# Patient Record
Sex: Female | Born: 1965
Health system: Southern US, Community
[De-identification: ages and names within clinical notes are randomized; demographics above are authoritative.]

## PROBLEM LIST (undated history)

## (undated) DIAGNOSIS — K219 Gastro-esophageal reflux disease without esophagitis: Secondary | ICD-10-CM

## (undated) DIAGNOSIS — I10 Essential (primary) hypertension: Secondary | ICD-10-CM

## (undated) DIAGNOSIS — E669 Obesity, unspecified: Secondary | ICD-10-CM

## (undated) DIAGNOSIS — F329 Major depressive disorder, single episode, unspecified: Secondary | ICD-10-CM

## (undated) DIAGNOSIS — D219 Benign neoplasm of connective and other soft tissue, unspecified: Secondary | ICD-10-CM

## (undated) DIAGNOSIS — F32A Depression, unspecified: Secondary | ICD-10-CM

## (undated) HISTORY — DX: Benign neoplasm of connective and other soft tissue, unspecified: D21.9

## (undated) HISTORY — PX: COLON SURGERY: SHX602

## (undated) HISTORY — PX: TUBAL LIGATION: SHX77

---

## 2006-09-10 HISTORY — PX: DILATION AND CURETTAGE OF UTERUS: SHX78

## 2008-11-22 ENCOUNTER — Emergency Department (HOSPITAL_COMMUNITY): Admission: EM | Admit: 2008-11-22 | Discharge: 2008-11-22 | Payer: Self-pay | Admitting: Emergency Medicine

## 2009-08-29 ENCOUNTER — Encounter: Admission: RE | Admit: 2009-08-29 | Discharge: 2009-11-01 | Payer: Self-pay | Admitting: Sports Medicine

## 2009-09-30 ENCOUNTER — Encounter: Admission: RE | Admit: 2009-09-30 | Discharge: 2009-09-30 | Payer: Self-pay | Admitting: Otolaryngology

## 2010-03-02 ENCOUNTER — Encounter: Payer: Self-pay | Admitting: Otolaryngology

## 2010-03-02 ENCOUNTER — Encounter: Payer: Self-pay | Admitting: Sports Medicine

## 2010-06-17 ENCOUNTER — Inpatient Hospital Stay (INDEPENDENT_AMBULATORY_CARE_PROVIDER_SITE_OTHER)
Admission: RE | Admit: 2010-06-17 | Discharge: 2010-06-17 | Disposition: A | Discharge status: Home / Self Care | Payer: Medicaid Other | Source: Ambulatory Visit | Attending: Emergency Medicine | Admitting: Emergency Medicine

## 2010-06-17 DIAGNOSIS — R51 Headache: Secondary | ICD-10-CM

## 2010-06-17 DIAGNOSIS — M549 Dorsalgia, unspecified: Secondary | ICD-10-CM

## 2010-06-17 DIAGNOSIS — M79609 Pain in unspecified limb: Secondary | ICD-10-CM

## 2010-09-11 ENCOUNTER — Ambulatory Visit: Payer: Medicaid Other | Attending: Podiatry | Admitting: Physical Therapy

## 2010-09-11 DIAGNOSIS — M25579 Pain in unspecified ankle and joints of unspecified foot: Secondary | ICD-10-CM | POA: Insufficient documentation

## 2010-09-11 DIAGNOSIS — IMO0001 Reserved for inherently not codable concepts without codable children: Secondary | ICD-10-CM | POA: Insufficient documentation

## 2010-09-11 DIAGNOSIS — M25673 Stiffness of unspecified ankle, not elsewhere classified: Secondary | ICD-10-CM | POA: Insufficient documentation

## 2010-09-11 DIAGNOSIS — M25676 Stiffness of unspecified foot, not elsewhere classified: Secondary | ICD-10-CM | POA: Insufficient documentation

## 2010-09-11 DIAGNOSIS — R262 Difficulty in walking, not elsewhere classified: Secondary | ICD-10-CM | POA: Insufficient documentation

## 2010-09-22 ENCOUNTER — Inpatient Hospital Stay (INDEPENDENT_AMBULATORY_CARE_PROVIDER_SITE_OTHER)
Admission: RE | Admit: 2010-09-22 | Discharge: 2010-09-22 | Disposition: A | Discharge status: Home / Self Care | Payer: Medicaid Other | Source: Ambulatory Visit | Attending: Family Medicine | Admitting: Family Medicine

## 2010-09-22 ENCOUNTER — Encounter: Payer: Medicaid Other | Admitting: Physical Therapy

## 2010-09-22 DIAGNOSIS — K5289 Other specified noninfective gastroenteritis and colitis: Secondary | ICD-10-CM

## 2010-09-22 DIAGNOSIS — B9789 Other viral agents as the cause of diseases classified elsewhere: Secondary | ICD-10-CM

## 2011-10-06 ENCOUNTER — Emergency Department (INDEPENDENT_AMBULATORY_CARE_PROVIDER_SITE_OTHER): Payer: Medicaid Other

## 2011-10-06 ENCOUNTER — Encounter (HOSPITAL_COMMUNITY): Payer: Self-pay | Admitting: Emergency Medicine

## 2011-10-06 ENCOUNTER — Emergency Department (INDEPENDENT_AMBULATORY_CARE_PROVIDER_SITE_OTHER)
Admission: EM | Admit: 2011-10-06 | Discharge: 2011-10-06 | Disposition: A | Discharge status: Home / Self Care | Payer: Medicaid Other | Source: Home / Self Care | Attending: Emergency Medicine | Admitting: Emergency Medicine

## 2011-10-06 DIAGNOSIS — J45909 Unspecified asthma, uncomplicated: Secondary | ICD-10-CM

## 2011-10-06 HISTORY — DX: Depression, unspecified: F32.A

## 2011-10-06 HISTORY — DX: Essential (primary) hypertension: I10

## 2011-10-06 HISTORY — DX: Major depressive disorder, single episode, unspecified: F32.9

## 2011-10-06 MED ORDER — ALBUTEROL SULFATE HFA 108 (90 BASE) MCG/ACT IN AERS
2.0000 | INHALATION_SPRAY | RESPIRATORY_TRACT | Status: DC
  Administered 2011-10-06: 2 via RESPIRATORY_TRACT

## 2011-10-06 MED ORDER — ALBUTEROL SULFATE HFA 108 (90 BASE) MCG/ACT IN AERS
INHALATION_SPRAY | RESPIRATORY_TRACT | Status: AC
  Filled 2011-10-06: qty 6.7

## 2011-10-06 MED ORDER — HYDROCODONE-ACETAMINOPHEN 5-325 MG PO TABS: ORAL_TABLET | ORAL | Status: AC

## 2011-10-06 MED ORDER — DOXYCYCLINE HYCLATE 100 MG PO TABS: 100.0000 mg | ORAL_TABLET | Freq: Two times a day (BID) | ORAL | Status: AC

## 2011-10-06 MED ORDER — ALBUTEROL SULFATE HFA 108 (90 BASE) MCG/ACT IN AERS: 1.0000 | INHALATION_SPRAY | Freq: Four times a day (QID) | RESPIRATORY_TRACT | Status: DC | PRN

## 2011-10-06 MED ORDER — PREDNISONE 10 MG PO TABS: ORAL_TABLET | ORAL | Status: DC

## 2011-10-06 NOTE — ED Notes (Signed)
Here for evaluation of SOB, hurts to breathe; NAD at present

## 2011-10-06 NOTE — ED Notes (Signed)
Was asked to evaluate this patient on arrival to [facility]. Patient states she was seen and released by another facility earlier today, and prescribed medication for her respiratory issues. She feels as if she is no better, even though she has started her new rx earlier today. Chest clear to ascultation, speaking in long , complex sentences w/o observable diffficulty. Skin w/d/color good; ?anxious.

## 2011-10-06 NOTE — ED Provider Notes (Signed)
Chief Complaint  Patient presents with  . Shortness of Breath    History of Present Illness:   Sarah Mathews is a 46 year old female who's had a ten-day history of cough productive yellow-green sputum, wheezing, and chest tightness. She also complains of shortness of breath and dyspnea on exertion. She feels tired and doesn't have much energy. She's had a headache and nasal congestion with yellowish to clear drainage. She denies any sore throat. She denies any anterior chest pain. She has some right posterior chest pain today which is now gone away. She saw her primary care physician, Dr. Mayford Knife, on August 22 and he diagnosed bronchitis and started her on Augmentin. This is caused nausea. He also thought her blood pressure to be elevated and started her on hydrochlorothiazide. She denies any PND, orthopnea, hemoptysis, fever, chills, sweats, ankle edema, leg pain, or calf swelling.  Review of Systems:  Other than noted above, the patient denies any of the following symptoms: Systemic:  No fevers, chills, sweats, weight loss or gain, fatigue, or tiredness. ENT:  No nasal congestion, sneezing, itching, postnasal drip, sinus pressure, headache, sore throat, or hoarseness. Lungs:  No wheezing, shortness of breath, chest tightness or congestion. Heart:  No chest pain, tightness, pressure, PND, orthopnea, or ankle edema. GI:  No indigestion, heartburn, waterbrash, burping, abdominal pain, nausea, or vomiting.  PMFSH:  Past medical history, family history, social history, meds, and allergies were reviewed.  Physical Exam:   Vital signs:  BP 138/78  Pulse 76  Temp 98.1 F (36.7 C) (Oral)  Resp 18  SpO2 100%  LMP 09/18/2011 General:  Alert and oriented.  In no distress.  Skin warm and dry. ENT: TMs and ear canals normal.  Nasal mucosa normal, without drainage.  Pharynx clear without exudate or drainage.  No intraoral lesions. Neck:  No adenopathy, tenderness or mass.  No JVD. Lungs:  No respiratory  distress.  Breath sounds clear and equal bilaterally.  No wheezes, rales or rhonchi. Heart:  Regular rhythm, no gallops or murmers.  No pedal edema. Abdomon:  Soft and nontender.  No organomegaly or mass.   Date: 10/06/2011  Rate: 74  Rhythm: normal sinus rhythm  QRS Axis: normal  Intervals: normal  ST/T Wave abnormalities: nonspecific T wave changes  Conduction Disutrbances:none  Narrative Interpretation: Normal sinus rhythm with nonspecific T wave changes. These are minimal showing only flattened T waves across the precordium. There no T wave inversions, no ST segment elevations, nothing to make me suspicious of ischemia.  Old EKG Reviewed: none available  Radiology:  Dg Chest 2 View  10/06/2011  *RADIOLOGY REPORT*  Clinical Data: Dizziness, shortness of breath  CHEST - 2 VIEW  Comparison: 11/22/2008  Findings: Lungs are clear. No pleural effusion or pneumothorax.  Cardiomediastinal silhouette is within normal limits.  Mild degenerative changes of the visualized thoracolumbar spine.  IMPRESSION: No evidence of acute cardiopulmonary disease.   Original Report Authenticated By: Charline Bills, M.D.    Assessment:  The encounter diagnosis was Asthmatic bronchitis.  Plan:   1.  The following meds were prescribed:   New Prescriptions   ALBUTEROL (PROVENTIL HFA;VENTOLIN HFA) 108 (90 BASE) MCG/ACT INHALER    Inhale 1-2 puffs into the lungs every 6 (six) hours as needed for wheezing.   DOXYCYCLINE (VIBRA-TABS) 100 MG TABLET    Take 1 tablet (100 mg total) by mouth 2 (two) times daily.   HYDROCODONE-ACETAMINOPHEN (NORCO/VICODIN) 5-325 MG PER TABLET    1 to 2 tabs every 4 to 6  hours as needed for pain.   PREDNISONE (DELTASONE) 10 MG TABLET    Take 4 tabs daily for 4 days, 3 tabs daily for 4 days, 2 tabs daily for 4 days, then 1 tab daily for 4 days.   2.  The patient was instructed in symptomatic care and handouts were given. 3.  The patient was told to return if becoming worse in any way, if  no better in 3 or 4 days, and given some red flag symptoms that would indicate earlier return.     Reuben Likes, MD 10/06/11 2150

## 2011-10-21 ENCOUNTER — Encounter (HOSPITAL_COMMUNITY): Payer: Self-pay | Admitting: Emergency Medicine

## 2011-10-21 ENCOUNTER — Emergency Department (HOSPITAL_COMMUNITY)
Admission: EM | Admit: 2011-10-21 | Discharge: 2011-10-22 | Disposition: A | Discharge status: Home / Self Care | Payer: Medicaid Other | Attending: Emergency Medicine | Admitting: Emergency Medicine

## 2011-10-21 DIAGNOSIS — I10 Essential (primary) hypertension: Secondary | ICD-10-CM | POA: Insufficient documentation

## 2011-10-21 DIAGNOSIS — K219 Gastro-esophageal reflux disease without esophagitis: Secondary | ICD-10-CM

## 2011-10-21 DIAGNOSIS — Z79899 Other long term (current) drug therapy: Secondary | ICD-10-CM | POA: Insufficient documentation

## 2011-10-21 DIAGNOSIS — F3289 Other specified depressive episodes: Secondary | ICD-10-CM | POA: Insufficient documentation

## 2011-10-21 DIAGNOSIS — F329 Major depressive disorder, single episode, unspecified: Secondary | ICD-10-CM | POA: Insufficient documentation

## 2011-10-21 DIAGNOSIS — M549 Dorsalgia, unspecified: Secondary | ICD-10-CM | POA: Insufficient documentation

## 2011-10-21 DIAGNOSIS — R079 Chest pain, unspecified: Secondary | ICD-10-CM | POA: Insufficient documentation

## 2011-10-21 HISTORY — DX: Gastro-esophageal reflux disease without esophagitis: K21.9

## 2011-10-21 LAB — BASIC METABOLIC PANEL
BUN: 15 mg/dL (ref 6–23)
Chloride: 98 mEq/L (ref 96–112)
Glucose, Bld: 106 mg/dL — ABNORMAL HIGH (ref 70–99)
Potassium: 3.7 mEq/L (ref 3.5–5.1)

## 2011-10-21 LAB — CBC
HCT: 35.3 % — ABNORMAL LOW (ref 36.0–46.0)
Hemoglobin: 11.3 g/dL — ABNORMAL LOW (ref 12.0–15.0)
RBC: 4.17 MIL/uL (ref 3.87–5.11)
WBC: 10.2 10*3/uL (ref 4.0–10.5)

## 2011-10-21 NOTE — ED Notes (Signed)
C/o pain in center of chest and belching x 1 hour.  Pt reports history of GERD but states this feels worse than ever before.  Denies sob, nausea, and vomiting.

## 2011-10-22 ENCOUNTER — Emergency Department (HOSPITAL_COMMUNITY): Payer: Medicaid Other

## 2011-10-22 LAB — POCT I-STAT TROPONIN I: Troponin i, poc: 0 ng/mL (ref 0.00–0.08)

## 2011-10-22 MED ORDER — CYCLOBENZAPRINE HCL 10 MG PO TABS
5.0000 mg | ORAL_TABLET | Freq: Once | ORAL | Status: DC
  Filled 2011-10-22: qty 1

## 2011-10-22 MED ORDER — KETOROLAC TROMETHAMINE 60 MG/2ML IM SOLN
60.0000 mg | Freq: Once | INTRAMUSCULAR | Status: AC
  Administered 2011-10-22: 60 mg via INTRAMUSCULAR
  Filled 2011-10-22: qty 2

## 2011-10-22 MED ORDER — GI COCKTAIL ~~LOC~~
30.0000 mL | Freq: Once | ORAL | Status: AC
  Administered 2011-10-22: 30 mL via ORAL
  Filled 2011-10-22: qty 30

## 2011-10-22 MED ORDER — CYCLOBENZAPRINE HCL 10 MG PO TABS: 10.0000 mg | ORAL_TABLET | Freq: Two times a day (BID) | ORAL | Status: AC | PRN

## 2011-10-22 MED ORDER — FAMOTIDINE 20 MG PO TABS: 20.0000 mg | ORAL_TABLET | Freq: Two times a day (BID) | ORAL | Status: DC

## 2011-10-22 MED ORDER — FAMOTIDINE 20 MG PO TABS
20.0000 mg | ORAL_TABLET | Freq: Once | ORAL | Status: AC
  Administered 2011-10-22: 20 mg via ORAL
  Filled 2011-10-22: qty 1

## 2011-10-22 NOTE — ED Notes (Signed)
Dr. Yao at bedside. 

## 2011-10-22 NOTE — ED Provider Notes (Signed)
History     CSN: [id number]  Arrival date & time 10/21/11  2102   First MD Initiated Contact with Patient 10/22/11 0014      Chief Complaint  Patient presents with  . Chest Pain    (Consider location/radiation/quality/duration/timing/severity/associated sxs/prior treatment) The history is provided by the patient.  Sarah Mathews is a 46 y.o. female hx of asthma, HTN, GERD (uncompliant with meds) here with chest pain, back pain. After she ate some chips today, she had some belching and burping then she had a burning sensation on her chest. No SOB or cough. She took her omeprazole and felt better. While in the ED, she developed worsening of her chronic back pain on the R side. No recent fall or trauma. No fevers. Cardiac risk factor include HTN. She has been recently treated for bronchitis with steroids and albuterol and doxycycline.    Past Medical History  Diagnosis Date  . Hypertension   . Depressed   . GERD (gastroesophageal reflux disease)     Past Surgical History  Procedure Date  . Tubal ligation     No family history on file.  History  Substance Use Topics  . Smoking status: Never Smoker   . Smokeless tobacco: Not on file  . Alcohol Use: Yes    OB History    Grav Para Term Preterm Abortions TAB SAB Ect Mult Living                  Review of Systems  Cardiovascular: Positive for chest pain.  Gastrointestinal: Positive for nausea.  Musculoskeletal: Positive for back pain.  All other systems reviewed and are negative.    Allergies  Darvocet; Darvon; Percocet; and Stadol  Home Medications   Current Outpatient Rx  Name Route Sig Dispense Refill  . ALBUTEROL SULFATE HFA 108 (90 BASE) MCG/ACT IN AERS Inhalation Inhale 1-2 puffs into the lungs every 6 (six) hours as needed for wheezing. 1 Inhaler 0  . BUTALBITAL-APAP-CAFF-COD 50-325-40-30 MG PO CAPS Oral Take 1 capsule by mouth every 4 (four) hours as needed. For pain    . DOXYCYCLINE HYCLATE 100 MG PO TABS  Oral Take 100 mg by mouth daily.    [name] HYDROCHLOROTHIAZIDE 25 MG PO TABS Oral Take 25 mg by mouth daily.    [name] OMEPRAZOLE 20 MG PO CPDR Oral Take 20 mg by mouth daily.    [name] PREDNISONE 10 MG PO TABS  Take 4 tabs daily for 4 days, 3 tabs daily for 4 days, 2 tabs daily for 4 days, then 1 tab daily for 4 days. 40 tablet 0  . PSEUDOEPHEDRINE-IBUPROFEN 30-200 MG PO CAPS Oral Take 2 capsules by mouth every 6 (six) hours as needed. For pain/cold/allergy symptoms      BP 115/69  Pulse 78  Temp 97.9 F (36.6 C) (Oral)  Resp 26  SpO2 100%  LMP 10/04/2011  Physical Exam  Nursing note and vitals reviewed. Constitutional: She is oriented to person, place, and time. She appears well-developed and well-nourished.       Uncomfortable   HENT:  Head: Normocephalic.  Mouth/Throat: Oropharynx is clear and moist.  Eyes: Conjunctivae normal are normal. Pupils are equal, round, and reactive to light.  Neck: Normal range of motion. Neck supple.  Cardiovascular: Normal rate, regular rhythm, normal heart sounds and intact distal pulses.   Pulmonary/Chest: Effort normal and breath sounds normal.  Abdominal: Soft. Bowel sounds are normal.  Musculoskeletal: Normal range of motion.  No saddle anesthesia. Tenderness in L paralumbar area. No midline tenderness.   Neurological: She is alert and oriented to person, place, and time.       Straight leg raise negative.   Skin: Skin is warm and dry.  Psychiatric: She has a normal mood and affect. Her behavior is normal. Judgment and thought content normal.    ED Course  Procedures (including critical care time)  Labs Reviewed  CBC - Abnormal; Notable for the following:    Hemoglobin 11.3 (*)     HCT 35.3 (*)     All other components within normal limits  BASIC METABOLIC PANEL - Abnormal; Notable for the following:    Glucose, Bld 106 (*)     GFR calc non Af Amer 66 (*)     GFR calc Af Amer 77 (*)     All other components within normal limits  POCT I-STAT  TROPONIN I  POCT I-STAT TROPONIN I   Dg Chest 2 View  10/22/2011  *RADIOLOGY REPORT*  Clinical Data: Chest pain.  CHEST - 2 VIEW  Comparison: 10/06/2011.  Findings: Poor inspiration.  Normal sized heart.  Clear lungs. Minimal central peribronchial thickening.  Unremarkable bones.  IMPRESSION: Minimal bronchitic changes.   Original Report Authenticated By: Darrol Angel, M.D.      1. Acid reflux   2. Back pain      Date: 10/22/2011  Rate: 90  Rhythm: normal sinus rhythm  QRS Axis: normal  Intervals: QT prolonged  ST/T Wave abnormalities: TWI inferiorly  Conduction Disutrbances:none  Narrative Interpretation:   Old EKG Reviewed: unchanged     MDM  Sarah Mathews is a 46 y.o. female hx of HTN here with chest pain, back pain. Her chest pain is likely secondary to reflux. She is low risk for ACS. Will do labs, trop x 2, cxr. Back pain is likely MSK in origin and will give pain meds and reassess.   2:15 AM Patient comfortable, pain free after meds. Trop neg x 2, CXR stable. She likely has reflux and will give pepcid and continue omeprazole.        Richardean Canal, MD 10/22/11 [phone]

## 2011-10-22 NOTE — ED Notes (Signed)
Pt states she has a hx of gerd and c/o L back pain post eating doritos.  She states this is a different kind of med.  She also c/o L chest pain post being tx for bronchitis.   Lung sounds clear.  All labs negative.

## 2012-04-08 ENCOUNTER — Emergency Department (INDEPENDENT_AMBULATORY_CARE_PROVIDER_SITE_OTHER): Payer: Medicare Other

## 2012-04-08 ENCOUNTER — Encounter (HOSPITAL_COMMUNITY): Payer: Self-pay | Admitting: *Deleted

## 2012-04-08 ENCOUNTER — Emergency Department (INDEPENDENT_AMBULATORY_CARE_PROVIDER_SITE_OTHER)
Admission: EM | Admit: 2012-04-08 | Discharge: 2012-04-08 | Disposition: A | Discharge status: Home / Self Care | Payer: Medicare Other | Source: Home / Self Care

## 2012-04-08 DIAGNOSIS — R1084 Generalized abdominal pain: Secondary | ICD-10-CM

## 2012-04-08 DIAGNOSIS — J45909 Unspecified asthma, uncomplicated: Secondary | ICD-10-CM | POA: Diagnosis not present

## 2012-04-08 DIAGNOSIS — K5900 Constipation, unspecified: Secondary | ICD-10-CM

## 2012-04-08 DIAGNOSIS — R109 Unspecified abdominal pain: Secondary | ICD-10-CM | POA: Diagnosis not present

## 2012-04-08 DIAGNOSIS — J069 Acute upper respiratory infection, unspecified: Secondary | ICD-10-CM | POA: Diagnosis not present

## 2012-04-08 DIAGNOSIS — R0982 Postnasal drip: Secondary | ICD-10-CM

## 2012-04-08 DIAGNOSIS — N390 Urinary tract infection, site not specified: Secondary | ICD-10-CM

## 2012-04-08 DIAGNOSIS — J040 Acute laryngitis: Secondary | ICD-10-CM | POA: Diagnosis not present

## 2012-04-08 LAB — POCT URINALYSIS DIP (DEVICE)
Glucose, UA: NEGATIVE mg/dL
Ketones, ur: NEGATIVE mg/dL
Protein, ur: NEGATIVE mg/dL
Specific Gravity, Urine: 1.015 (ref 1.005–1.030)

## 2012-04-08 MED ORDER — ALBUTEROL SULFATE (5 MG/ML) 0.5% IN NEBU
5.0000 mg | INHALATION_SOLUTION | Freq: Once | RESPIRATORY_TRACT | Status: AC
  Administered 2012-04-08: 5 mg via RESPIRATORY_TRACT

## 2012-04-08 MED ORDER — PHENYLEPHRINE-CHLORPHEN-DM 10-4-12.5 MG/5ML PO LIQD: 5.0000 mL | ORAL | Status: DC | PRN

## 2012-04-08 MED ORDER — TRIAMCINOLONE ACETONIDE 40 MG/ML IJ SUSP
INTRAMUSCULAR | Status: AC
  Filled 2012-04-08: qty 5

## 2012-04-08 MED ORDER — CEPHALEXIN 500 MG PO CAPS: 500.0000 mg | ORAL_CAPSULE | Freq: Four times a day (QID) | ORAL | Status: DC

## 2012-04-08 MED ORDER — IPRATROPIUM BROMIDE 0.02 % IN SOLN
0.5000 mg | Freq: Once | RESPIRATORY_TRACT | Status: AC
  Administered 2012-04-08: 0.5 mg via RESPIRATORY_TRACT

## 2012-04-08 MED ORDER — ALBUTEROL SULFATE (5 MG/ML) 0.5% IN NEBU
INHALATION_SOLUTION | RESPIRATORY_TRACT | Status: AC
  Filled 2012-04-08: qty 1

## 2012-04-08 MED ORDER — ALBUTEROL SULFATE HFA 108 (90 BASE) MCG/ACT IN AERS: 1.0000 | INHALATION_SPRAY | Freq: Four times a day (QID) | RESPIRATORY_TRACT | Status: DC | PRN

## 2012-04-08 MED ORDER — TRIAMCINOLONE ACETONIDE 40 MG/ML IJ SUSP
40.0000 mg | Freq: Once | INTRAMUSCULAR | Status: AC
Start: 2012-04-08 — End: 2012-04-08
  Administered 2012-04-08: 40 mg via INTRAMUSCULAR

## 2012-04-08 NOTE — ED Provider Notes (Signed)
Medical screening examination/treatment/procedure(s) were performed by resident physician or non-physician practitioner and as supervising physician I was immediately available for consultation/collaboration.   Barkley Bruns MD.   Linna Hoff, MD 04/08/12 [phone]

## 2012-04-08 NOTE — ED Notes (Addendum)
C/o sore throat onset Thur with laryngitis.  Had chills last night, dizziness and SOB.  Also c/o low back and low abdominal pain x 5 days.  C/o frequent urination every 5 min. onset yesterday afternoon.  After exam pain started up again in her abdomen.

## 2012-04-08 NOTE — ED Provider Notes (Signed)
History     CSN: [id number]  Arrival date & time 04/08/12  1510   First MD Initiated Contact with Patient 04/08/12 1540      Chief Complaint  Patient presents with  . Sore Throat  . Laryngitis    (Consider location/radiation/quality/duration/timing/severity/associated sxs/prior treatment) HPI Comments: 47 year old obese female who developed a cough 2 days ago. Associated with PND and a sensation that she has tightness in her upper chest. She felt like she was wheezing last night. When coughing she produces a yellow to green sputum. She is also having pain across her abdomen. She says it is crampy, it radiates to her left back. Is also has occurred for 2 days denies nausea or vomiting. Denies dysuria but has some frequency of urination since she started her HCTZ.   Past Medical History  Diagnosis Date  . Hypertension   . Depressed   . GERD (gastroesophageal reflux disease)     Past Surgical History  Procedure Laterality Date  . Tubal ligation    . Dilation and curettage of uterus  09/2006    Family History  Problem Relation Age of Onset  . Heart failure Mother     History  Substance Use Topics  . Smoking status: Never Smoker   . Smokeless tobacco: Not on file  . Alcohol Use: Yes     Comment: occasional    OB History   Grav Para Term Preterm Abortions TAB SAB Ect Mult Living                  Review of Systems  Constitutional: Positive for activity change. Negative for fever and chills.  HENT: Positive for congestion, sore throat, rhinorrhea, postnasal drip and sinus pressure. Negative for ear pain, trouble swallowing, neck pain and neck stiffness.   Respiratory: Positive for cough and wheezing.   Cardiovascular: Positive for chest pain.  Gastrointestinal: Positive for abdominal pain. Negative for nausea, vomiting, constipation and blood in stool.  Genitourinary: Positive for frequency. Negative for dysuria, urgency, hematuria, decreased urine volume, difficulty  urinating and pelvic pain.  Musculoskeletal: Positive for back pain.  Skin: Negative.   Psychiatric/Behavioral: Negative.     Allergies  Darvocet; Darvon; Percocet; and Stadol  Home Medications   Current Outpatient Rx  Name  Route  Sig  Dispense  Refill  . albuterol (PROVENTIL HFA;VENTOLIN HFA) 108 (90 BASE) MCG/ACT inhaler   Inhalation   Inhale 1-2 puffs into the lungs every 6 (six) hours as needed for wheezing.   1 Inhaler   0   . hydrochlorothiazide (HYDRODIURIL) 25 MG tablet   Oral   Take 25 mg by mouth daily.         [name] omeprazole (PRILOSEC) 20 MG capsule   Oral   Take 20 mg by mouth daily.         [name] albuterol (PROVENTIL HFA;VENTOLIN HFA) 108 (90 BASE) MCG/ACT inhaler   Inhalation   Inhale 1-2 puffs into the lungs every 6 (six) hours as needed for wheezing.   1 Inhaler   0   . butalbital-acetaminophen-caffeine (FIORICET WITH CODEINE) 50-325-40-30 MG per capsule   Oral   Take 1 capsule by mouth every 4 (four) hours as needed. For pain         . cephALEXin (KEFLEX) 500 MG capsule   Oral   Take 1 capsule (500 mg total) by mouth 4 (four) times daily.   28 capsule   0   . doxycycline (VIBRA-TABS) 100 MG tablet   Oral  Take 100 mg by mouth daily.         . famotidine (PEPCID) 20 MG tablet   Oral   Take 1 tablet (20 mg total) by mouth 2 (two) times daily.   30 tablet   0   . Phenylephrine-Chlorphen-DM 11-13-10.5 MG/5ML LIQD   Oral   Take 5 mLs by mouth every 4 (four) hours as needed.   120 mL   0   . predniSONE (DELTASONE) 10 MG tablet      Take 4 tabs daily for 4 days, 3 tabs daily for 4 days, 2 tabs daily for 4 days, then 1 tab daily for 4 days.   40 tablet   0   . Pseudoephedrine-Ibuprofen (ADVIL COLD & SINUS LIQUI-GELS) 30-200 MG CAPS   Oral   Take 2 capsules by mouth every 6 (six) hours as needed. For pain/cold/allergy symptoms           BP 101/74  Pulse 81  Temp(Src) 98.6 F (37 C) (Oral)  Resp 24  SpO2 100%  LMP  03/23/2012  Physical Exam  Nursing note and vitals reviewed. Constitutional: She is oriented to person, place, and time. She appears well-nourished. No distress.  Severe obesity  HENT:  Right Ear: External ear normal.  Left Ear: External ear normal.  Mouth/Throat: No oropharyngeal exudate.  Mild erythema and moderate amount clear PND   Eyes: Conjunctivae and EOM are normal.  Neck: Normal range of motion. Neck supple. No thyromegaly present.  Cardiovascular: Normal rate, regular rhythm and normal heart sounds.   Pulmonary/Chest: Effort normal and breath sounds normal. No respiratory distress. She has no wheezes. She has no rales.  Abdominal: Soft. There is tenderness. There is no rebound.  Generalized tenderness, greatest LUQ, bilateral mid abdomen.  Musculoskeletal: She exhibits no edema and no tenderness.  Lymphadenopathy:    She has no cervical adenopathy.  Neurological: She is alert and oriented to person, place, and time. She exhibits normal muscle tone.  Skin: Skin is warm and dry. No rash noted.  Psychiatric: She has a normal mood and affect.    ED Course  Procedures (including critical care time)  Labs Reviewed  POCT URINALYSIS DIP (DEVICE) - Abnormal; Notable for the following:    Leukocytes, UA TRACE (*)    All other components within normal limits   Dg Abd 1 View  04/08/2012  *RADIOLOGY REPORT*  Clinical Data: Generalized abdominal pain.  ABDOMEN - 1 VIEW  Comparison: None.  Findings: The bowel gas pattern is unremarkable.  There is no evidence for obstruction or free air.  The axial skeleton is within normal limits.  IMPRESSION: Negative abdomen.   Original Report Authenticated By: Marin Roberts, M.D.      1. URI (upper respiratory infection)   2. PND (post-nasal drip)   3. Laryngitis   4. Abdominal pain, acute, generalized   5. Constipation   6. RAD (reactive airway disease)       MDM  Norell CS 1 teaspoon every 4 hours when necessary cough  congestion and drainage Albuterol HFA 2 puffs every 4-6 hours when necessary cough and wheeze. Keflex 500 mg 4 times a day for 7 days  MiraLax and full glass of water to help relieve constipation. Patient is instructed to go to emergency department if her abdominal pain increases or does not improve. If she develops fever, vomiting or inability to have bowel movement she should go to the emergency department. Otherwise, follow up with your primary care doctor next  week. The patient was administered a duo neb, post neb she states that she felt that she could breathe a little better and her upper chest discomfort has abated. Kenalog 40 mg IM.       Hayden Rasmussen, NP 04/08/12 1810

## 2012-05-26 DIAGNOSIS — N760 Acute vaginitis: Secondary | ICD-10-CM | POA: Diagnosis not present

## 2012-05-26 DIAGNOSIS — R87619 Unspecified abnormal cytological findings in specimens from cervix uteri: Secondary | ICD-10-CM | POA: Diagnosis not present

## 2012-05-26 DIAGNOSIS — L02419 Cutaneous abscess of limb, unspecified: Secondary | ICD-10-CM | POA: Diagnosis not present

## 2012-05-26 DIAGNOSIS — L03119 Cellulitis of unspecified part of limb: Secondary | ICD-10-CM | POA: Diagnosis not present

## 2012-05-26 DIAGNOSIS — R5381 Other malaise: Secondary | ICD-10-CM | POA: Diagnosis not present

## 2012-05-26 DIAGNOSIS — M5430 Sciatica, unspecified side: Secondary | ICD-10-CM | POA: Diagnosis not present

## 2012-05-26 DIAGNOSIS — E785 Hyperlipidemia, unspecified: Secondary | ICD-10-CM | POA: Diagnosis not present

## 2012-08-05 DIAGNOSIS — I10 Essential (primary) hypertension: Secondary | ICD-10-CM | POA: Diagnosis not present

## 2012-08-05 DIAGNOSIS — M549 Dorsalgia, unspecified: Secondary | ICD-10-CM | POA: Diagnosis not present

## 2012-08-05 DIAGNOSIS — K219 Gastro-esophageal reflux disease without esophagitis: Secondary | ICD-10-CM | POA: Diagnosis not present

## 2012-08-05 DIAGNOSIS — M5430 Sciatica, unspecified side: Secondary | ICD-10-CM | POA: Diagnosis not present

## 2012-09-16 ENCOUNTER — Other Ambulatory Visit: Payer: Self-pay | Admitting: Gastroenterology

## 2012-09-16 DIAGNOSIS — R1310 Dysphagia, unspecified: Secondary | ICD-10-CM | POA: Diagnosis not present

## 2012-09-16 DIAGNOSIS — K219 Gastro-esophageal reflux disease without esophagitis: Secondary | ICD-10-CM | POA: Diagnosis not present

## 2012-09-27 ENCOUNTER — Other Ambulatory Visit: Payer: Medicare Other

## 2012-10-07 ENCOUNTER — Other Ambulatory Visit: Payer: Medicare Other

## 2012-10-24 ENCOUNTER — Ambulatory Visit
Admission: RE | Admit: 2012-10-24 | Discharge: 2012-10-24 | Disposition: A | Discharge status: Home / Self Care | Payer: Medicare Other | Source: Ambulatory Visit | Attending: Gastroenterology | Admitting: Gastroenterology

## 2012-10-24 DIAGNOSIS — R1310 Dysphagia, unspecified: Secondary | ICD-10-CM

## 2012-10-30 ENCOUNTER — Emergency Department (INDEPENDENT_AMBULATORY_CARE_PROVIDER_SITE_OTHER)
Admission: EM | Admit: 2012-10-30 | Discharge: 2012-10-30 | Disposition: A | Discharge status: Home / Self Care | Payer: Medicare Other | Source: Home / Self Care

## 2012-10-30 ENCOUNTER — Encounter (HOSPITAL_COMMUNITY): Payer: Self-pay | Admitting: *Deleted

## 2012-10-30 DIAGNOSIS — J309 Allergic rhinitis, unspecified: Secondary | ICD-10-CM

## 2012-10-30 MED ORDER — METHYLPREDNISOLONE 4 MG PO KIT: PACK | ORAL | Status: DC

## 2012-10-30 NOTE — ED Provider Notes (Signed)
Medical screening examination/treatment/procedure(s) were performed by non-physician practitioner and as supervising physician I was immediately available for consultation/collaboration.  Leslee Home, M.D.   Reuben Likes, MD 10/30/12 1739

## 2012-10-30 NOTE — ED Notes (Signed)
pT  Reports  Symptoms  Of  Sinus  congestioon  /  Hoarseness  As  Well  As    Stuffy  Nose  And  Allergy  Symptoms    X  3  Months     She  Reports  She  Has  Seen her PCP   And  Has  Been Rx  nasonex  And  Allegra

## 2012-10-30 NOTE — ED Provider Notes (Signed)
CSN: [id number]     Arrival date & time 10/30/12  1544 History   First MD Initiated Contact with Patient 10/30/12 1718     Chief Complaint  Patient presents with  . URI   (Consider location/radiation/quality/duration/timing/severity/associated sxs/prior Treatment) HPI Comments: 47 year old obese female complaining of allergy symptoms for 3 months. Primarily complaining of nasal stuffiness and PND. She has been treated by her PCP with nasal next and Allegra-D. She states she is having heart palpitations, nausea and jitteriness with that medication. She states she is really not getting any better.   Past Medical History  Diagnosis Date  . Hypertension   . Depressed   . GERD (gastroesophageal reflux disease)    Past Surgical History  Procedure Laterality Date  . Tubal ligation    . Dilation and curettage of uterus  09/2006   Family History  Problem Relation Age of Onset  . Heart failure Mother    History  Substance Use Topics  . Smoking status: Never Smoker   . Smokeless tobacco: Not on file  . Alcohol Use: Yes     Comment: occasional   OB History   Grav Para Term Preterm Abortions TAB SAB Ect Mult Living                 Review of Systems  Constitutional: Negative for fever, chills, activity change, appetite change and fatigue.  HENT: Positive for congestion, rhinorrhea and postnasal drip. Negative for ear pain, sore throat, facial swelling, neck pain and neck stiffness.   Eyes: Negative.   Respiratory: Negative.  Negative for cough, shortness of breath and wheezing.   Cardiovascular: Negative.   Gastrointestinal: Negative.   Skin: Negative for pallor and rash.  Neurological: Negative.     Allergies  Darvocet; Darvon; Percocet; and Stadol  Home Medications   Current Outpatient Rx  Name  Route  Sig  Dispense  Refill  . EXPIRED: albuterol (PROVENTIL HFA;VENTOLIN HFA) 108 (90 BASE) MCG/ACT inhaler   Inhalation   Inhale 1-2 puffs into the lungs every 6 (six) hours as  needed for wheezing.   1 Inhaler   0   . albuterol (PROVENTIL HFA;VENTOLIN HFA) 108 (90 BASE) MCG/ACT inhaler   Inhalation   Inhale 1-2 puffs into the lungs every 6 (six) hours as needed for wheezing.   1 Inhaler   0   . butalbital-acetaminophen-caffeine (FIORICET WITH CODEINE) 50-325-40-30 MG per capsule   Oral   Take 1 capsule by mouth every 4 (four) hours as needed. For pain         . cephALEXin (KEFLEX) 500 MG capsule   Oral   Take 1 capsule (500 mg total) by mouth 4 (four) times daily.   28 capsule   0   . doxycycline (VIBRA-TABS) 100 MG tablet   Oral   Take 100 mg by mouth daily.         [name] EXPIRED: famotidine (PEPCID) 20 MG tablet   Oral   Take 1 tablet (20 mg total) by mouth 2 (two) times daily.   30 tablet   0   . hydrochlorothiazide (HYDRODIURIL) 25 MG tablet   Oral   Take 25 mg by mouth daily.         . methylPREDNISolone (MEDROL DOSEPAK) 4 MG tablet      follow package directions. Take with food   21 tablet   0   . omeprazole (PRILOSEC) 20 MG capsule   Oral   Take 20 mg by mouth daily.         [name]  Phenylephrine-Chlorphen-DM 11-13-10.5 MG/5ML LIQD   Oral   Take 5 mLs by mouth every 4 (four) hours as needed.   120 mL   0   . Pseudoephedrine-Ibuprofen (ADVIL COLD & SINUS LIQUI-GELS) 30-200 MG CAPS   Oral   Take 2 capsules by mouth every 6 (six) hours as needed. For pain/cold/allergy symptoms          BP 145/72  Pulse 73  Temp(Src) 98.4 F (36.9 C) (Oral)  Resp 16  SpO2 100% Physical Exam  Nursing note and vitals reviewed. Constitutional: She is oriented to person, place, and time. She appears well-developed and well-nourished. No distress.  HENT:  Mouth/Throat: No oropharyngeal exudate.  Bilateral TMs are normal Oropharynx with minor light erythema with evidence of clear PND.  Eyes: Conjunctivae and EOM are normal.  Neck: Normal range of motion. Neck supple.  Cardiovascular: Normal rate, regular rhythm and normal heart sounds.    Pulmonary/Chest: Effort normal and breath sounds normal. No respiratory distress. She has no wheezes. She has no rales.  Musculoskeletal: Normal range of motion. She exhibits no edema.  Lymphadenopathy:    She has no cervical adenopathy.  Neurological: She is alert and oriented to person, place, and time.  Skin: Skin is warm and dry. No rash noted.  Psychiatric: She has a normal mood and affect.    ED Course  Procedures (including critical care time) Labs Review Labs Reviewed - No data to display Imaging Review No results found.  MDM   1. Allergic rhinitis due to allergen   2. Allergic sinusitis      Stop the Allegra-D. The pseudoephedrine decongestants causing her side effects increasing her blood pressure. Instead take Sudafed PE, phenylephrine 10 mg every 4-6 hours when necessary congestion May take plain Allegra 180 mg daily Use copious amounts of saline nasal spray frequently And Medrol Dosepak. Followup with your PCP as needed in one to 2 weeks.   Hayden Rasmussen, NP 10/30/12 1733

## 2012-12-01 ENCOUNTER — Encounter (HOSPITAL_COMMUNITY): Payer: Self-pay | Admitting: Emergency Medicine

## 2012-12-01 ENCOUNTER — Emergency Department (INDEPENDENT_AMBULATORY_CARE_PROVIDER_SITE_OTHER)
Admission: EM | Admit: 2012-12-01 | Discharge: 2012-12-01 | Disposition: A | Discharge status: Home / Self Care | Payer: Medicare Other | Source: Home / Self Care | Attending: Emergency Medicine | Admitting: Emergency Medicine

## 2012-12-01 ENCOUNTER — Emergency Department (INDEPENDENT_AMBULATORY_CARE_PROVIDER_SITE_OTHER): Payer: Medicare Other

## 2012-12-01 DIAGNOSIS — R0789 Other chest pain: Secondary | ICD-10-CM | POA: Diagnosis not present

## 2012-12-01 DIAGNOSIS — R079 Chest pain, unspecified: Secondary | ICD-10-CM | POA: Diagnosis not present

## 2012-12-01 LAB — POCT PREGNANCY, URINE: Preg Test, Ur: NEGATIVE

## 2012-12-01 MED ORDER — METHOCARBAMOL 500 MG PO TABS: 500.0000 mg | ORAL_TABLET | Freq: Three times a day (TID) | ORAL | Status: DC

## 2012-12-01 MED ORDER — DICLOFENAC SODIUM 75 MG PO TBEC: 75.0000 mg | DELAYED_RELEASE_TABLET | Freq: Two times a day (BID) | ORAL | Status: DC

## 2012-12-01 NOTE — ED Provider Notes (Signed)
Chief Complaint:   Chief Complaint  Patient presents with  . Chest Pain    History of Present Illness:   Sarah Mathews is a 47 year old female with high blood pressure who presents with a history since last night of bilateral pectoral pain without radiation. This came on when she was yelling at her children. It's worse with any movement, but not with deep inspiration. The pain has been continuous since then. It waxes and wanes, but now is a 7/10. She denies any associated shortness of breath, nausea, or diaphoresis. She's had no fever, chills, sweats, coughing, wheezing, palpitations, dizziness, syncope, ankle edema, or leg pain. She does have gastroesophageal reflux but this is under control on current meds. She denies any abdominal pain, nausea, or vomiting. The patient has had aching in her lower back, she's had some nasal congestion, allergy symptoms, and sinus pressure. She has also had headache, feels sleepy, tired, and had some blurry vision. She has no cardiac history. No history of diabetes, elevated cholesterol, or cigarette smoking.  Review of Systems:  Other than noted above, the patient denies any of the following symptoms. Systemic:  No fever, chills, sweats, or fatigue. ENT:  No nasal congestion, rhinorrhea, or sore throat. Pulmonary:  No cough, wheezing, shortness of breath, sputum production, hemoptysis. Cardiac:  No palpitations, rapid heartbeat, dizziness, presyncope or syncope. GI:  No abdominal pain, heartburn, nausea, or vomiting. Ext:  No leg pain or swelling.  PMFSH:  Past medical history, family history, social history, meds, and allergies were reviewed and updated as needed. She is allergic to Stadol, Darvon, and Percocet. Current meds include hydrochlorothiazide and DEXILANT. She has high blood pressure and gastroesophageal reflux.  Physical Exam:   Vital signs:  BP 120/73  Pulse 81  Temp(Src) 98.2 F (36.8 C) (Oral)  Resp 12  SpO2 100%  LMP 11/07/2012 Gen:   Alert, oriented, in no distress, skin warm and dry. Eye:  PERRL, lids and conjunctivas normal.  Sclera non-icteric. ENT:  Mucous membranes moist, pharynx clear. Neck:  Supple, no adenopathy or tenderness.  No JVD. Lungs:  Clear to auscultation, no wheezes, rales or rhonchi.  No respiratory distress. Heart:  Regular rhythm.  No gallops, murmers, clicks or rubs. Chest:  She has tenderness to palpation in the right pectoral area. Abdomen:  Soft, nontender, no organomegaly or mass.  Bowel sounds normal.  No pulsatile abdominal mass or bruit. Ext:  No edema.  No calf tenderness and Homann's sign negative.  Pulses full and equal. Skin:  Warm and dry.  No rash.  Labs:   Results for orders placed during the hospital encounter of 12/01/12  POCT PREGNANCY, URINE      Result Value Range   Preg Test, Ur NEGATIVE  NEGATIVE     Radiology:  Dg Chest 2 View  12/01/2012   CLINICAL DATA:  Chest pain.  EXAM: CHEST  2 VIEW  COMPARISON:  10/22/2011.  FINDINGS: The heart size and mediastinal contours are within normal limits. Both lungs are clear. The visualized skeletal structures are unremarkable.  IMPRESSION: No active cardiopulmonary disease.   Electronically Signed   By: Loralie Champagne M.D.   On: 12/01/2012 17:16   I reviewed the images independently and personally and concur with the radiologist's findings.  EKG:   Date: 12/01/2012  Rate: 66  Rhythm: normal sinus rhythm  QRS Axis: normal  Intervals: normal  ST/T Wave abnormalities: nonspecific T wave changes  Conduction Disutrbances:none  Narrative Interpretation: Normal sinus rhythm, nonspecific T wave  abnormalities with inverted T waves in leads V3 and V4. These have been present on previous EKGs, last noted 10/21/2011.  Old EKG Reviewed: unchanged  Assessment:  The encounter diagnosis was Musculoskeletal chest pain.  No evidence of cardiac cause for the chest pain.  Plan:   1.  Meds:  The following meds were prescribed:   New  Prescriptions   DICLOFENAC (VOLTAREN) 75 MG EC TABLET    Take 1 tablet (75 mg total) by mouth 2 (two) times daily.   METHOCARBAMOL (ROBAXIN) 500 MG TABLET    Take 1 tablet (500 mg total) by mouth 3 (three) times daily.    2.  Patient Education/Counseling:  The patient was given appropriate handouts, self care instructions, and instructed in symptomatic relief.    3.  Follow up:  The patient was told to follow up if no better in 3 to 4 days, if becoming worse in any way, and give an an some red flag symptoms such as worsening pain or shortness of breath which would prompt immediate return.  Follow up here as needed.     Reuben Likes, MD 12/01/12 [phone]

## 2012-12-01 NOTE — ED Notes (Signed)
Pt  Reports  Pain  r  Side  Of  Chest  -   Hurts  On  Certain  posistions   And  Certain  Movements     -  Pt  States     She  Lifted  Heavy  Objects  Yesterday          She  Also  Reports  She  Yelled  Loudly  At  Her  Children  Which  Made  Pain worse

## 2012-12-27 ENCOUNTER — Emergency Department (INDEPENDENT_AMBULATORY_CARE_PROVIDER_SITE_OTHER)
Admission: EM | Admit: 2012-12-27 | Discharge: 2012-12-27 | Disposition: A | Discharge status: Home / Self Care | Payer: Medicare Other | Source: Home / Self Care

## 2012-12-27 ENCOUNTER — Encounter (HOSPITAL_COMMUNITY): Payer: Self-pay | Admitting: Emergency Medicine

## 2012-12-27 DIAGNOSIS — J329 Chronic sinusitis, unspecified: Secondary | ICD-10-CM

## 2012-12-27 DIAGNOSIS — J9801 Acute bronchospasm: Secondary | ICD-10-CM

## 2012-12-27 MED ORDER — ALBUTEROL SULFATE HFA 108 (90 BASE) MCG/ACT IN AERS: 2.0000 | INHALATION_SPRAY | Freq: Four times a day (QID) | RESPIRATORY_TRACT | Status: DC | PRN

## 2012-12-27 MED ORDER — METHYLPREDNISOLONE 4 MG PO KIT: PACK | ORAL | Status: DC

## 2012-12-27 NOTE — ED Provider Notes (Signed)
CSN: [id number]     Arrival date & time 12/27/12  1915 History   First MD Initiated Contact with Patient 12/27/12 2026     Chief Complaint  Patient presents with  . Sinusitis   (Consider location/radiation/quality/duration/timing/severity/associated sxs/prior Treatment) HPI Comments: 47-year-old female is complaining of upper respiratory congestion, sinus drainage, runny eyes, scratchy throat, PND, shortness of breath, achiness in the upper anterior chest pain for over 6 months. She was seen by me in the urgent care a little over a month ago and was treated with corticosteroids, albuterol, and a nasal spray. She said she got a little better but the symptoms got worse again topically after running out of her albuterol. She is taking a corticosteroid nasal inhaler she is using nasal saline spray,  oxymetazoline nasal spray, Sudafed PE, Mucinex, ibuprofen. She states she is unable to take a deep breath without coughing. She saw her PCP recently for the same symptoms and was told "the same thing that told her. He gave her a corticosteroid nasal spray and told her to continue the medications. She is not better in the morning some relief.    Past Medical History  Diagnosis Date  . Hypertension   . Depressed   . GERD (gastroesophageal reflux disease)    Past Surgical History  Procedure Laterality Date  . Tubal ligation    . Dilation and curettage of uterus  09/2006   Family History  Problem Relation Age of Onset  . Heart failure Mother    History  Substance Use Topics  . Smoking status: Never Smoker   . Smokeless tobacco: Not on file  . Alcohol Use: Yes     Comment: occasional   OB History   Grav Para Term Preterm Abortions TAB SAB Ect Mult Living                 Review of Systems  Constitutional: Negative for fever, diaphoresis, activity change and appetite change.  HENT: Positive for congestion, postnasal drip, rhinorrhea, sinus pressure and sore throat. Negative for facial swelling  and mouth sores.   Eyes: Negative.   Respiratory: Positive for chest tightness, shortness of breath and wheezing.   Gastrointestinal: Negative.   Genitourinary: Negative.   Neurological: Negative.     Allergies  Darvocet; Darvon; Percocet; and Stadol  Home Medications   Current Outpatient Rx  Name  Route  Sig  Dispense  Refill  . hydrochlorothiazide (HYDRODIURIL) 25 MG tablet   Oral   Take 25 mg by mouth daily.         [name] EXPIRED: albuterol (PROVENTIL HFA;VENTOLIN HFA) 108 (90 BASE) MCG/ACT inhaler   Inhalation   Inhale 1-2 puffs into the lungs every 6 (six) hours as needed for wheezing.   1 Inhaler   0   . albuterol (PROVENTIL HFA;VENTOLIN HFA) 108 (90 BASE) MCG/ACT inhaler   Inhalation   Inhale 1-2 puffs into the lungs every 6 (six) hours as needed for wheezing.   1 Inhaler   0   . albuterol (PROVENTIL HFA;VENTOLIN HFA) 108 (90 BASE) MCG/ACT inhaler   Inhalation   Inhale 2 puffs into the lungs every 6 (six) hours as needed for wheezing or shortness of breath.   1 Inhaler   0   . butalbital-acetaminophen-caffeine (FIORICET WITH CODEINE) 50-325-40-30 MG per capsule   Oral   Take 1 capsule by mouth every 4 (four) hours as needed. For pain         . cephALEXin (KEFLEX) 500 MG capsule  Oral   Take 1 capsule (500 mg total) by mouth 4 (four) times daily.   28 capsule   0   . diclofenac (VOLTAREN) 75 MG EC tablet   Oral   Take 1 tablet (75 mg total) by mouth 2 (two) times daily.   20 tablet   0   . doxycycline (VIBRA-TABS) 100 MG tablet   Oral   Take 100 mg by mouth daily.         [name] EXPIRED: famotidine (PEPCID) 20 MG tablet   Oral   Take 1 tablet (20 mg total) by mouth 2 (two) times daily.   30 tablet   0   . methocarbamol (ROBAXIN) 500 MG tablet   Oral   Take 1 tablet (500 mg total) by mouth 3 (three) times daily.   30 tablet   0   . methylPREDNISolone (MEDROL DOSEPAK) 4 MG tablet      follow package directions. Take with food   21 tablet    0   . methylPREDNISolone (MEDROL DOSEPAK) 4 MG tablet      follow package directions   21 tablet   0   . omeprazole (PRILOSEC) 20 MG capsule   Oral   Take 20 mg by mouth daily.         [name] Phenylephrine-Chlorphen-DM 11-13-10.5 MG/5ML LIQD   Oral   Take 5 mLs by mouth every 4 (four) hours as needed.   120 mL   0   . Pseudoephedrine-Ibuprofen (ADVIL COLD & SINUS LIQUI-GELS) 30-200 MG CAPS   Oral   Take 2 capsules by mouth every 6 (six) hours as needed. For pain/cold/allergy symptoms          BP 104/66  Pulse 75  Temp(Src) 98.7 F (37.1 C) (Oral)  Resp 18  SpO2 100%  LMP 12/06/2012 Physical Exam  Nursing note and vitals reviewed. Constitutional: She is oriented to person, place, and time. She appears well-developed and well-nourished. No distress.  HENT:  Right Ear: External ear normal.  Left Ear: External ear normal.  Minor erythema to the OP. No swelling or exudates.  Eyes: Conjunctivae and EOM are normal.  Neck: Normal range of motion. Neck supple.  Cardiovascular: Normal rate, regular rhythm and normal heart sounds.   Pulmonary/Chest: Effort normal and breath sounds normal. No respiratory distress. She has no rales.  Taking a deep breath causes coughing spasms. With her usual tidal volume there is no wheezing. Been taking a full deep breath there is an occasional expiratory wheeze.  Musculoskeletal: Normal range of motion. She exhibits no edema.  Lymphadenopathy:    She has no cervical adenopathy.  Neurological: She is alert and oriented to person, place, and time.  Skin: Skin is warm and dry. No rash noted.  Psychiatric: She has a normal mood and affect.    ED Course  Procedures (including critical care time) Labs Review Labs Reviewed - No data to display Imaging Review No results found.      MDM   1. Chronic rhinosinusitis   2. Bronchospasm      Medrol dose pack Albuterol HFA rf Cont your meds Call your PCP for possible referral to  ENT.  Hayden Rasmussen, NP 12/27/12 2107

## 2012-12-27 NOTE — ED Notes (Signed)
Pt c/o poss sinus infection Sxs include: nasal congestion, chest tightness, ST, SOB, wheezing, naseas Denies: f/v/d Taking Sudafed w/no relief Alert w/no signs of acute distress

## 2012-12-28 NOTE — ED Provider Notes (Signed)
Medical screening examination/treatment/procedure(s) were performed by a resident physician or non-physician practitioner and as the supervising physician I was immediately available for consultation/collaboration.  Clementeen Graham, MD    Rodolph Bong, MD 12/28/12 [phone]

## 2013-02-07 ENCOUNTER — Ambulatory Visit (INDEPENDENT_AMBULATORY_CARE_PROVIDER_SITE_OTHER): Payer: Medicare Other | Admitting: Podiatry

## 2013-02-07 ENCOUNTER — Encounter: Payer: Self-pay | Admitting: Podiatry

## 2013-02-07 DIAGNOSIS — M79609 Pain in unspecified limb: Secondary | ICD-10-CM | POA: Diagnosis not present

## 2013-02-07 DIAGNOSIS — M659 Synovitis and tenosynovitis, unspecified: Secondary | ICD-10-CM | POA: Insufficient documentation

## 2013-02-07 DIAGNOSIS — M216X9 Other acquired deformities of unspecified foot: Secondary | ICD-10-CM | POA: Diagnosis not present

## 2013-02-07 DIAGNOSIS — M65979 Unspecified synovitis and tenosynovitis, unspecified ankle and foot: Secondary | ICD-10-CM

## 2013-02-07 DIAGNOSIS — M79673 Pain in unspecified foot: Secondary | ICD-10-CM | POA: Insufficient documentation

## 2013-02-07 MED ORDER — HYDROCODONE-IBUPROFEN 7.5-200 MG PO TABS: 1.0000 | ORAL_TABLET | Freq: Three times a day (TID) | ORAL | Status: DC | PRN

## 2013-02-07 MED ORDER — NABUMETONE 500 MG PO TABS: 500.0000 mg | ORAL_TABLET | Freq: Two times a day (BID) | ORAL | Status: DC

## 2013-02-07 NOTE — Patient Instructions (Signed)
Seen for bilateral foot pain. Take medication as prescribed. Return in 2 weeks.

## 2013-02-07 NOTE — Progress Notes (Signed)
Subjective: Bilateral foot pain R>L. Right heel near instep area hurts and top of both feet hurts and feel tight.  Patient was here before about last year with right lateral column pain. Usually no on feet much. But was on feet a lot during the holiday.  When having feet pain, it causes back spasm. She was on medication of back spasm. Last episode was 2 weeks ago.   Objective: Severe ankle equinus bilateral with severe pain on right. Neurovascular status are within normal.   Assessment: Tenosynovitis right rearfoot and mid foot secondary to faulty biomechanics.  Ankle Equinus bilateral. Edema foot and ankle bilateral.  Plan: Reviewed findings. Will use anti inflammatory medication to subdue pain and inflammation. Will refer out for physical therapy to stretch Achilles tendon on next visit.

## 2013-02-21 ENCOUNTER — Ambulatory Visit: Payer: Medicare Other | Admitting: Podiatry

## 2013-03-14 DIAGNOSIS — H16229 Keratoconjunctivitis sicca, not specified as Sjogren's, unspecified eye: Secondary | ICD-10-CM | POA: Diagnosis not present

## 2013-03-14 DIAGNOSIS — H40039 Anatomical narrow angle, unspecified eye: Secondary | ICD-10-CM | POA: Diagnosis not present

## 2013-03-21 ENCOUNTER — Encounter (HOSPITAL_COMMUNITY): Payer: Self-pay | Admitting: Emergency Medicine

## 2013-03-21 ENCOUNTER — Emergency Department (INDEPENDENT_AMBULATORY_CARE_PROVIDER_SITE_OTHER)
Admission: EM | Admit: 2013-03-21 | Discharge: 2013-03-21 | Disposition: A | Discharge status: Home / Self Care | Payer: Medicare Other | Source: Home / Self Care | Attending: Family Medicine | Admitting: Family Medicine

## 2013-03-21 DIAGNOSIS — M25569 Pain in unspecified knee: Secondary | ICD-10-CM

## 2013-03-21 DIAGNOSIS — E669 Obesity, unspecified: Secondary | ICD-10-CM

## 2013-03-21 DIAGNOSIS — M7062 Trochanteric bursitis, left hip: Secondary | ICD-10-CM

## 2013-03-21 DIAGNOSIS — M545 Low back pain, unspecified: Secondary | ICD-10-CM

## 2013-03-21 DIAGNOSIS — M76899 Other specified enthesopathies of unspecified lower limb, excluding foot: Secondary | ICD-10-CM

## 2013-03-21 MED ORDER — PREDNISONE 10 MG PO KIT: PACK | ORAL | Status: DC

## 2013-03-21 NOTE — ED Notes (Signed)
C/o L knee pain onset x 2 weeks.  No known injury.  Pt. States it is swollen.  C/o low back and low abdominal cramping for about 2 weeks.  States it comes and goes but has been continuous for 3 days.  LMP 1/20.

## 2013-03-21 NOTE — ED Provider Notes (Signed)
Sarah Mathews is a 48 y.o. female who presents to Urgent Care today for left back, hip and knee pain. She has moderate pain. She has tried ibuprofen which has not helped very much. The majority of her pain is in her lateral left hip. The pain is worse with prolonged standing or rising from a seated position. She denies any radiating pain weakness or numbness. She denies any bowel bladder dysfunction.   Past Medical History  Diagnosis Date  . Hypertension   . Depressed   . GERD (gastroesophageal reflux disease)    History  Substance Use Topics  . Smoking status: Never Smoker   . Smokeless tobacco: Never Used  . Alcohol Use: Yes     Comment: occasional   ROS as above Medications: No current facility-administered medications for this encounter.   Current Outpatient Prescriptions  Medication Sig Dispense Refill  . albuterol (PROVENTIL HFA;VENTOLIN HFA) 108 (90 BASE) MCG/ACT inhaler Inhale 2 puffs into the lungs every 6 (six) hours as needed for wheezing or shortness of breath.  1 Inhaler  0  . hydrochlorothiazide (HYDRODIURIL) 25 MG tablet Take 25 mg by mouth daily.      . methocarbamol (ROBAXIN) 500 MG tablet Take 500 mg by mouth every 8 (eight) hours as needed.      . nabumetone (RELAFEN) 500 MG tablet Take 1 tablet (500 mg total) by mouth 2 (two) times daily.  60 tablet  1  . doxycycline (VIBRA-TABS) 100 MG tablet Take 100 mg by mouth daily.      . famotidine (PEPCID) 20 MG tablet Take 1 tablet (20 mg total) by mouth 2 (two) times daily.  30 tablet  0  . HYDROcodone-ibuprofen (VICOPROFEN) 7.5-200 MG per tablet Take 1 tablet by mouth every 8 (eight) hours as needed for moderate pain.  60 tablet  0  . PredniSONE 10 MG KIT 12 day dose pack po  1 kit  0  . Pseudoephedrine-Ibuprofen (ADVIL COLD & SINUS LIQUI-GELS) 30-200 MG CAPS Take 2 capsules by mouth every 6 (six) hours as needed. For pain/cold/allergy symptoms        Exam:  BP 135/66  Pulse 79  Temp(Src) 98.8 F (37.1 C) (Oral)   Resp 16  SpO2 100%  LMP 02/28/2013 Gen: Well NAD morbidly obese HEENT: EOMI,  MMM Lungs: Normal work of breathing. CTABL Heart: RRR no MRG Abd: NABS, Soft. NT, ND Exts: Brisk capillary refill, warm and well perfused.  Back: Nontender to spinal midline. Tender palpation left SI joint. Left hip normal-appearing. Normal range of motion. Tender palpation greater trochanter. Weak with hip abduction. Left knee. Normal appearing normal range of motion rectal crepitations present. Tender palpation lateral joint line with positive McMurray's test. Stable ligamentous exam  No results found for this or any previous visit (from the past 24 hour(s)). No results found.  Assessment and Plan: 48 y.o. female with left greater trochanteric bursitis associated with lumbago and probable lateral meniscus injury. Plan to treat with prednisone hip adduction strength and exercises and followup with sports medicine for evaluation of her knee. Emphasize weight loss.  Discussed warning signs or symptoms. Please see discharge instructions. Patient expresses understanding.    Gregor Hams, MD 03/21/13 2036

## 2013-03-21 NOTE — Discharge Instructions (Signed)
Thank you for coming in today. Take prednisone daily for 12 days.  Do the exercises.  Follow up with Dr. Alfonso Ramus as needed.  Come back or go to the emergency room if you notice new weakness new numbness problems walking or bowel or bladder problems.

## 2013-04-03 DIAGNOSIS — Z124 Encounter for screening for malignant neoplasm of cervix: Secondary | ICD-10-CM | POA: Diagnosis not present

## 2013-04-03 DIAGNOSIS — R87610 Atypical squamous cells of undetermined significance on cytologic smear of cervix (ASC-US): Secondary | ICD-10-CM | POA: Diagnosis not present

## 2013-04-03 DIAGNOSIS — E785 Hyperlipidemia, unspecified: Secondary | ICD-10-CM | POA: Diagnosis not present

## 2013-04-03 DIAGNOSIS — R5383 Other fatigue: Secondary | ICD-10-CM | POA: Diagnosis not present

## 2013-04-03 DIAGNOSIS — M5430 Sciatica, unspecified side: Secondary | ICD-10-CM | POA: Diagnosis not present

## 2013-04-03 DIAGNOSIS — R87619 Unspecified abnormal cytological findings in specimens from cervix uteri: Secondary | ICD-10-CM | POA: Diagnosis not present

## 2013-04-03 DIAGNOSIS — N926 Irregular menstruation, unspecified: Secondary | ICD-10-CM | POA: Diagnosis not present

## 2013-04-03 DIAGNOSIS — N760 Acute vaginitis: Secondary | ICD-10-CM | POA: Diagnosis not present

## 2013-04-03 DIAGNOSIS — R5381 Other malaise: Secondary | ICD-10-CM | POA: Diagnosis not present

## 2013-04-03 DIAGNOSIS — M79609 Pain in unspecified limb: Secondary | ICD-10-CM | POA: Diagnosis not present

## 2013-04-18 ENCOUNTER — Ambulatory Visit: Payer: Medicare Other | Admitting: Podiatry

## 2013-04-21 DIAGNOSIS — M25569 Pain in unspecified knee: Secondary | ICD-10-CM | POA: Diagnosis not present

## 2013-10-24 DIAGNOSIS — M5430 Sciatica, unspecified side: Secondary | ICD-10-CM | POA: Diagnosis not present

## 2013-10-24 DIAGNOSIS — T7840XA Allergy, unspecified, initial encounter: Secondary | ICD-10-CM | POA: Diagnosis not present

## 2013-10-24 DIAGNOSIS — M79609 Pain in unspecified limb: Secondary | ICD-10-CM | POA: Diagnosis not present

## 2013-10-24 DIAGNOSIS — Z124 Encounter for screening for malignant neoplasm of cervix: Secondary | ICD-10-CM | POA: Diagnosis not present

## 2013-10-24 DIAGNOSIS — E785 Hyperlipidemia, unspecified: Secondary | ICD-10-CM | POA: Diagnosis not present

## 2013-10-24 DIAGNOSIS — J301 Allergic rhinitis due to pollen: Secondary | ICD-10-CM | POA: Diagnosis not present

## 2013-10-24 DIAGNOSIS — R5381 Other malaise: Secondary | ICD-10-CM | POA: Diagnosis not present

## 2013-10-24 DIAGNOSIS — R5383 Other fatigue: Secondary | ICD-10-CM | POA: Diagnosis not present

## 2013-10-24 DIAGNOSIS — R87619 Unspecified abnormal cytological findings in specimens from cervix uteri: Secondary | ICD-10-CM | POA: Diagnosis not present

## 2013-10-24 DIAGNOSIS — N760 Acute vaginitis: Secondary | ICD-10-CM | POA: Diagnosis not present

## 2013-10-24 DIAGNOSIS — N926 Irregular menstruation, unspecified: Secondary | ICD-10-CM | POA: Diagnosis not present

## 2013-11-07 DIAGNOSIS — D119 Benign neoplasm of major salivary gland, unspecified: Secondary | ICD-10-CM | POA: Diagnosis not present

## 2013-11-07 DIAGNOSIS — T485X1A Poisoning by other anti-common-cold drugs, accidental (unintentional), initial encounter: Secondary | ICD-10-CM | POA: Diagnosis not present

## 2013-11-07 DIAGNOSIS — J343 Hypertrophy of nasal turbinates: Secondary | ICD-10-CM | POA: Diagnosis not present

## 2013-11-07 DIAGNOSIS — L299 Pruritus, unspecified: Secondary | ICD-10-CM | POA: Diagnosis not present

## 2013-11-07 DIAGNOSIS — D170 Benign lipomatous neoplasm of skin and subcutaneous tissue of head, face and neck: Secondary | ICD-10-CM | POA: Diagnosis not present

## 2013-11-07 DIAGNOSIS — J310 Chronic rhinitis: Secondary | ICD-10-CM | POA: Diagnosis not present

## 2013-11-07 DIAGNOSIS — T48201A Poisoning by unspecified drugs acting on muscles, accidental (unintentional), initial encounter: Secondary | ICD-10-CM | POA: Diagnosis not present

## 2013-12-13 ENCOUNTER — Encounter (HOSPITAL_COMMUNITY): Payer: Self-pay | Admitting: *Deleted

## 2013-12-13 ENCOUNTER — Emergency Department (HOSPITAL_COMMUNITY)
Admission: EM | Admit: 2013-12-13 | Discharge: 2013-12-14 | Disposition: A | Discharge status: Home / Self Care | Payer: Medicare Other | Attending: Emergency Medicine | Admitting: Emergency Medicine

## 2013-12-13 ENCOUNTER — Emergency Department (HOSPITAL_COMMUNITY): Payer: Medicare Other

## 2013-12-13 DIAGNOSIS — F329 Major depressive disorder, single episode, unspecified: Secondary | ICD-10-CM | POA: Insufficient documentation

## 2013-12-13 DIAGNOSIS — Z7952 Long term (current) use of systemic steroids: Secondary | ICD-10-CM | POA: Insufficient documentation

## 2013-12-13 DIAGNOSIS — K219 Gastro-esophageal reflux disease without esophagitis: Secondary | ICD-10-CM | POA: Diagnosis not present

## 2013-12-13 DIAGNOSIS — M545 Low back pain: Secondary | ICD-10-CM | POA: Diagnosis not present

## 2013-12-13 DIAGNOSIS — X58XXXA Exposure to other specified factors, initial encounter: Secondary | ICD-10-CM | POA: Diagnosis not present

## 2013-12-13 DIAGNOSIS — Y929 Unspecified place or not applicable: Secondary | ICD-10-CM | POA: Diagnosis not present

## 2013-12-13 DIAGNOSIS — Z792 Long term (current) use of antibiotics: Secondary | ICD-10-CM | POA: Diagnosis not present

## 2013-12-13 DIAGNOSIS — M549 Dorsalgia, unspecified: Secondary | ICD-10-CM | POA: Diagnosis present

## 2013-12-13 DIAGNOSIS — S39012A Strain of muscle, fascia and tendon of lower back, initial encounter: Secondary | ICD-10-CM | POA: Insufficient documentation

## 2013-12-13 DIAGNOSIS — R0602 Shortness of breath: Secondary | ICD-10-CM | POA: Diagnosis not present

## 2013-12-13 DIAGNOSIS — I10 Essential (primary) hypertension: Secondary | ICD-10-CM | POA: Insufficient documentation

## 2013-12-13 DIAGNOSIS — Y939 Activity, unspecified: Secondary | ICD-10-CM | POA: Insufficient documentation

## 2013-12-13 DIAGNOSIS — R079 Chest pain, unspecified: Secondary | ICD-10-CM | POA: Insufficient documentation

## 2013-12-13 DIAGNOSIS — E669 Obesity, unspecified: Secondary | ICD-10-CM | POA: Diagnosis not present

## 2013-12-13 DIAGNOSIS — Z79899 Other long term (current) drug therapy: Secondary | ICD-10-CM | POA: Diagnosis not present

## 2013-12-13 DIAGNOSIS — Z8659 Personal history of other mental and behavioral disorders: Secondary | ICD-10-CM | POA: Diagnosis not present

## 2013-12-13 HISTORY — DX: Obesity, unspecified: E66.9

## 2013-12-13 LAB — URINALYSIS, ROUTINE W REFLEX MICROSCOPIC
Bilirubin Urine: NEGATIVE
Glucose, UA: NEGATIVE mg/dL
Hgb urine dipstick: NEGATIVE
KETONES UR: NEGATIVE mg/dL
NITRITE: NEGATIVE
Protein, ur: NEGATIVE mg/dL
Specific Gravity, Urine: 1.015 (ref 1.005–1.030)
UROBILINOGEN UA: 0.2 mg/dL (ref 0.0–1.0)
pH: 7 (ref 5.0–8.0)

## 2013-12-13 LAB — URINE MICROSCOPIC-ADD ON

## 2013-12-13 LAB — CBC
HCT: 30.8 % — ABNORMAL LOW (ref 36.0–46.0)
Hemoglobin: 10 g/dL — ABNORMAL LOW (ref 12.0–15.0)
MCH: 27 pg (ref 26.0–34.0)
MCHC: 32.5 g/dL (ref 30.0–36.0)
MCV: 83.2 fL (ref 78.0–100.0)
PLATELETS: 261 10*3/uL (ref 150–400)
RBC: 3.7 MIL/uL — AB (ref 3.87–5.11)
RDW: 13.5 % (ref 11.5–15.5)
WBC: 6 10*3/uL (ref 4.0–10.5)

## 2013-12-13 LAB — BASIC METABOLIC PANEL
Anion gap: 12 (ref 5–15)
BUN: 8 mg/dL (ref 6–23)
CO2: 25 mEq/L (ref 19–32)
Calcium: 9.1 mg/dL (ref 8.4–10.5)
Chloride: 99 mEq/L (ref 96–112)
Creatinine, Ser: 0.85 mg/dL (ref 0.50–1.10)
GFR calc Af Amer: >90 mL/min (ref >90)
GFR calc non Af Amer: 80 mL/min — ABNORMAL LOW (ref >90)
Glucose, Bld: 95 mg/dL (ref 70–99)
Potassium: 3.8 mEq/L (ref 3.7–5.3)
Sodium: 136 mEq/L — ABNORMAL LOW (ref 137–147)

## 2013-12-13 LAB — I-STAT TROPONIN, ED: TROPONIN I, POC: 0 ng/mL (ref 0.00–0.08)

## 2013-12-13 LAB — PRO B NATRIURETIC PEPTIDE: Pro B Natriuretic peptide (BNP): 130.6 pg/mL — ABNORMAL HIGH (ref 0–125)

## 2013-12-13 LAB — D-DIMER, QUANTITATIVE: D-Dimer, Quant: 0.39 ug/mL-FEU (ref 0.00–0.48)

## 2013-12-13 NOTE — ED Notes (Addendum)
Pt reports having sob with mild exertion, denies recent cough. Pt thinks it may be related to hx of anemia.Having pain to entire back, denies injury to back. Ambulatory at triage.

## 2013-12-13 NOTE — ED Provider Notes (Signed)
CSN: [id number]     Arrival date & time 12/13/13  1812 History   First MD Initiated Contact with Patient 12/13/13 2209     Chief Complaint  Patient presents with  . Shortness of Breath  . Back Pain     (Consider location/radiation/quality/duration/timing/severity/associated sxs/prior Treatment) The history is provided by the patient. No language interpreter was used.  Sarah Mathews is a 48 y/o F with PMHx of HTN, depression, GERD, obesity presenting to the ED with shortness of breath, feeling of weakness, and back pain. Patient reported that she has been dealing with back pain for the past 2 weeks localized to the lower back described as a sharp pain - as if someone "shot" her, patient reported that the pain worsens with motion. Reported that she has been feeling weak - rported that she normally gets a B12 shot every month, but stated that she missed this month. Stated that she does have history of anemia, but is currently taking iron pills. Reported that her Hgb borders around 10.5. Stated that her LMP was 10/18/2013 - stated that she thinks she is going through menopause soon. Reported that she has been experiencing shortness of breath for the past 1-2 days worse with exertion, reported that when she cleans the house or goes from one place to another she has to stop to catch her breath. Patient reported that her mother has history of CHF - stated that her grandmother had history of CHF as well. Denies fever, chills, fall, injury, urinary and bowel incontinence, numbness, tingling, loss of sensation, leg swelling, cough, neck pain, neck stiffness, headache melena, hematochezia, birth control, travel. PCP Dr. Jimmye Norman at [facility]  Past Medical History  Diagnosis Date  . Hypertension   . Depressed   . GERD (gastroesophageal reflux disease)   . Obesity    Past Surgical History  Procedure Laterality Date  . Tubal ligation    . Dilation and curettage of uterus  09/2006    Family History  Problem Relation Age of Onset  . Heart failure Mother    History  Substance Use Topics  . Smoking status: Never Smoker   . Smokeless tobacco: Never Used  . Alcohol Use: Yes     Comment: occasional   OB History    No data available     Review of Systems  Constitutional: Negative for fever and chills.  Eyes: Negative for visual disturbance.  Respiratory: Positive for shortness of breath. Negative for cough and chest tightness.   Cardiovascular: Positive for chest pain.  Gastrointestinal: Negative for nausea, vomiting and abdominal pain.  Genitourinary: Negative for dysuria, vaginal bleeding, vaginal discharge, vaginal pain and pelvic pain.  Musculoskeletal: Positive for back pain. Negative for neck pain and neck stiffness.  Neurological: Negative for dizziness, weakness and headaches.      Allergies  Darvocet; Darvon; Percocet; and Stadol  Home Medications   Prior to Admission medications   Medication Sig Start Date End Date Taking? Authorizing Provider  albuterol (PROVENTIL HFA;VENTOLIN HFA) 108 (90 BASE) MCG/ACT inhaler Inhale 2 puffs into the lungs every 6 (six) hours as needed for wheezing or shortness of breath. 12/27/12  Yes Janne Napoleon, NP  hydrochlorothiazide (HYDRODIURIL) 25 MG tablet Take 25 mg by mouth daily.   Yes Historical Provider, MD  cyclobenzaprine (FLEXERIL) 10 MG tablet Take 0.5 tablets (5 mg total) by mouth 2 (two) times daily as needed for muscle spasms. 12/14/13   Kemonie Cutillo, PA-C  doxycycline (VIBRA-TABS) 100 MG tablet Take 100  mg by mouth daily.    Historical Provider, MD  famotidine (PEPCID) 20 MG tablet Take 1 tablet (20 mg total) by mouth 2 (two) times daily. 10/22/11 10/21/12  Wandra Arthurs, MD  HYDROcodone-acetaminophen (NORCO/VICODIN) 5-325 MG per tablet Take 1 tablet by mouth every 6 (six) hours as needed for moderate pain or severe pain. 12/14/13   Sahvanna Mcmanigal, PA-C  HYDROcodone-ibuprofen (VICOPROFEN) 7.5-200 MG per tablet  Take 1 tablet by mouth every 8 (eight) hours as needed for moderate pain. 02/07/13   Myeong Sheard, DPM  methocarbamol (ROBAXIN) 500 MG tablet Take 500 mg by mouth every 8 (eight) hours as needed. 12/01/12   Harden Mo, MD  nabumetone (RELAFEN) 500 MG tablet Take 1 tablet (500 mg total) by mouth 2 (two) times daily. 02/07/13   Myeong Sheard, DPM  PredniSONE 10 MG KIT 12 day dose pack po 03/21/13   Gregor Hams, MD  Pseudoephedrine-Ibuprofen (ADVIL COLD & SINUS LIQUI-GELS) 30-200 MG CAPS Take 2 capsules by mouth every 6 (six) hours as needed. For pain/cold/allergy symptoms    Historical Provider, MD   BP 122/60 mmHg  Pulse 75  Temp(Src) 98.4 F (36.9 C) (Oral)  Resp 18  SpO2 99%  LMP 11/17/2013 Physical Exam  Constitutional: She is oriented to person, place, and time. She appears well-developed and well-nourished. No distress.  HENT:  Head: Normocephalic and atraumatic.  Mouth/Throat: Oropharynx is clear and moist. No oropharyngeal exudate.  Eyes: Conjunctivae and EOM are normal. Pupils are equal, round, and reactive to light. Right eye exhibits no discharge. Left eye exhibits no discharge.  Neck: Normal range of motion. Neck supple. No tracheal deviation present.  Negative neck stiffness Negative nuchal rigidity  Negative cervical lymphadenopathy  Negative meningeal signs   Cardiovascular: Normal rate, regular rhythm and normal heart sounds.   Pulses:      Radial pulses are 2+ on the right side, and 2+ on the left side.       Dorsalis pedis pulses are 2+ on the right side, and 2+ on the left side.  Negative swelling or pitting edema noted to the lower extremities bilaterally   Pulmonary/Chest: Effort normal and breath sounds normal. No respiratory distress. She has no wheezes. She has no rales. She exhibits tenderness.  Patient is able to speak in full sentences without difficulty  Negative use of accessory muscles Negative stridor Discomfort upon palpation to the chest wall -  pain reproducible upon palpation  Abdominal:  Obese   Musculoskeletal: Normal range of motion. She exhibits tenderness.       Lumbar back: She exhibits tenderness. She exhibits normal range of motion, no bony tenderness, no swelling and no edema.       Back:  Negative swelling or deformities noted to the spine. Mild discomfort upon palpation to the lumbosacral spine - muscular in nature. Pulling sensation with rotation of the torso.  Full ROM to upper and lower extremities without difficulty noted, negative ataxia noted.  Lymphadenopathy:    She has no cervical adenopathy.  Neurological: She is alert and oriented to person, place, and time. No cranial nerve deficit. She exhibits normal muscle tone. Coordination normal.  Cranial nerves III-XII grossly intact Strength 5+/5+ to upper and lower extremities bilaterally with resistance applied, equal distribution noted Sensation intact with differentiation to sharp and dull touch  Equal grip strength bilaterally Negative facial droop Negative slurred speech  Negative aphasia Negative arm drift Fine motor skills intact Patient is able to bring finger to nose  bilaterally without difficulty or ataxia Negative saddle paresthesias bilaterally  Gait proper, proper balance - negative sway, negative drift, negative step-offs  Skin: Skin is warm and dry. No rash noted. She is not diaphoretic. No erythema.  Psychiatric: She has a normal mood and affect. Her behavior is normal. Thought content normal.  Nursing note and vitals reviewed.   ED Course  Procedures (including critical care time)  Results for orders placed or performed during the hospital encounter of 12/13/13  CBC  Result Value Ref Range   WBC 6.0 4.0 - 10.5 K/uL   RBC 3.70 (L) 3.87 - 5.11 MIL/uL   Hemoglobin 10.0 (L) 12.0 - 15.0 g/dL   HCT 30.8 (L) 36.0 - 46.0 %   MCV 83.2 78.0 - 100.0 fL   MCH 27.0 26.0 - 34.0 pg   MCHC 32.5 30.0 - 36.0 g/dL   RDW 13.5 11.5 - 15.5 %   Platelets  261 150 - 400 K/uL  Basic metabolic panel  Result Value Ref Range   Sodium 136 (L) 137 - 147 mEq/L   Potassium 3.8 3.7 - 5.3 mEq/L   Chloride 99 96 - 112 mEq/L   CO2 25 19 - 32 mEq/L   Glucose, Bld 95 70 - 99 mg/dL   BUN 8 6 - 23 mg/dL   Creatinine, Ser 0.85 0.50 - 1.10 mg/dL   Calcium 9.1 8.4 - 10.5 mg/dL   GFR calc non Af Amer 80 (L) >90 mL/min   GFR calc Af Amer >90 >90 mL/min   Anion gap 12 5 - 15  BNP (order ONLY if patient complains of dyspnea/SOB AND you have documented it for THIS visit)  Result Value Ref Range   Pro B Natriuretic peptide (BNP) 130.6 (H) 0 - 125 pg/mL  Urinalysis, Routine w reflex microscopic  Result Value Ref Range   Color, Urine YELLOW YELLOW   APPearance CLEAR CLEAR   Specific Gravity, Urine 1.015 1.005 - 1.030   pH 7.0 5.0 - 8.0   Glucose, UA NEGATIVE NEGATIVE mg/dL   Hgb urine dipstick NEGATIVE NEGATIVE   Bilirubin Urine NEGATIVE NEGATIVE   Ketones, ur NEGATIVE NEGATIVE mg/dL   Protein, ur NEGATIVE NEGATIVE mg/dL   Urobilinogen, UA 0.2 0.0 - 1.0 mg/dL   Nitrite NEGATIVE NEGATIVE   Leukocytes, UA MODERATE (A) NEGATIVE  Troponin I  Result Value Ref Range   Troponin I <0.30 <0.30 ng/mL  D-dimer, quantitative  Result Value Ref Range   D-Dimer, Quant 0.39 0.00 - 0.48 ug/mL-FEU  Urine microscopic-add on  Result Value Ref Range   Squamous Epithelial / LPF RARE RARE   WBC, UA 3-6 <3 WBC/hpf   RBC / HPF 0-2 <3 RBC/hpf   Bacteria, UA RARE RARE  Pregnancy, urine  Result Value Ref Range   Preg Test, Ur NEGATIVE NEGATIVE  I-stat troponin, ED (not at [facility])  Result Value Ref Range   Troponin i, poc 0.00 0.00 - 0.08 ng/mL   Comment 3            Labs Review Labs Reviewed  CBC - Abnormal; Notable for the following:    RBC 3.70 (*)    Hemoglobin 10.0 (*)    HCT 30.8 (*)    All other components within normal limits  BASIC METABOLIC PANEL - Abnormal; Notable for the following:    Sodium 136 (*)    GFR calc non Af Amer 80 (*)    All other  components within normal limits  PRO B NATRIURETIC PEPTIDE -  Abnormal; Notable for the following:    Pro B Natriuretic peptide (BNP) 130.6 (*)    All other components within normal limits  URINALYSIS, ROUTINE W REFLEX MICROSCOPIC - Abnormal; Notable for the following:    Leukocytes, UA MODERATE (*)    All other components within normal limits  TROPONIN I  D-DIMER, QUANTITATIVE  URINE MICROSCOPIC-ADD ON  PREGNANCY, URINE  I-STAT TROPOININ, ED    Imaging Review Dg Chest 2 View  12/13/2013   CLINICAL DATA:  Shortness of breath.  Low back pain  EXAM: CHEST  2 VIEW  COMPARISON:  None.  FINDINGS: The heart size and mediastinal contours are within normal limits. Both lungs are clear. The visualized skeletal structures are unremarkable.  IMPRESSION: No active cardiopulmonary disease.   Electronically Signed   By: Kerby Moors M.D.   On: 12/13/2013 19:16   Dg Lumbar Spine Complete  12/14/2013   CLINICAL DATA:  Lower left back pain radiating down the left leg for 3 weeks. No injury.  EXAM: LUMBAR SPINE - COMPLETE 4+ VIEW  COMPARISON:  None.  FINDINGS: There is no evidence of lumbar spine fracture. Alignment is normal. Intervertebral disc spaces are maintained.  IMPRESSION: Negative.   Electronically Signed   By: Lucienne Capers M.D.   On: 12/14/2013 01:05     EKG Interpretation   Date/Time:  Wednesday December 13 2013 18:32:26 EST Ventricular Rate:  70 PR Interval:  166 QRS Duration: 84 QT Interval:  420 QTC Calculation: 453 R Axis:   17 Text Interpretation:  Normal sinus rhythm with sinus arrhythmia  Nonspecific T wave abnormality Abnormal ECG Ni acute findings Confirmed by  Kathrynn Humble, MD, Thelma Comp [phone]) on 12/14/2013 12:39:35 AM      1:24 AM Dr. Kathrynn Humble at bedside assessing patient. As per physician, recommended patient to be discharged home with Norco and Flexeril.  Patient reported that she has taken Vicodin in the past without issues or reactions.   MDM   Final diagnoses:  Back  pain  Lumbar strain, initial encounter  Shortness of breath    Medications  ibuprofen (ADVIL,MOTRIN) tablet 400 mg (400 mg Oral Given 12/14/13 0109)    Filed Vitals:   12/14/13 0020 12/14/13 0025 12/14/13 0110 12/14/13 0149  BP:  188/60 122/60   Pulse:  63 75   Temp:  98.6 F (37 C) 98.6 F (37 C) 98.4 F (36.9 C)  TempSrc:  Oral Oral Oral  Resp:  20 18   SpO2: 97%  99%     EKG noted normal sinus rhythm with nonspecific T-wave abnormality, heart rate 70 bpm. i-STAT troponin negative elevation. Second troponin negative elevation. D-dimer negative elevation. BNP mildly elevated at 130.6. CBC unremarkable-hemoglobin 10.0, hematocrit 30.8. BMP unremarkable. Urinalysis noted moderate leukocytes with negative elevated white blood cell count. Urine pregnancy negative. Chest x-ray negative acute abnormalities noted. Lumbar plain film negative for acute abnormalities.  Doubt PE. Doubt ACS - delta troponins negative. Doubt CHF exacerbation-BNP is mildly elevated, negative findings of fluid overload on exam. Pulse ox 97% on room air with ambulation. Negative signs of respiratory distress. HEART score 2. Doubt cauda equina. Doubt epidural abscess. Negative focal neurological deficits noted.pulses palpable and strong. Gait proper with negative step-offs or sway. Definitive etiology of shortness of breath unknown, cannot rule out possible stable angina. Patient stable, afebrile. Patient not septic appearing. Patient seen and assessed by attending physician, Dr. Kathrynn Humble who recommended patient to be discharged. Referred to PCP and Cardiology. Discussed wit patient to rest and stay  hydrated. Discussed with patient to avoid any physical or strenuous activity - recommended heat to the lower back. Discharged patient with pain medications and muscle relaxer - discussed course, precautions, disposal technique. Discussed with patient to closely monitor symptoms and if symptoms are to worsen or change to report back  to the ED - strict return instructions given.  Patient agreed to plan of care, understood, all questions answered.   Jamse Mead, PA-C 12/14/13 0147  Jamse Mead, PA-C 12/14/13 [id number]  Varney Biles, MD 12/14/13 [phone]

## 2013-12-14 ENCOUNTER — Emergency Department (HOSPITAL_COMMUNITY): Payer: Medicare Other

## 2013-12-14 DIAGNOSIS — S39012A Strain of muscle, fascia and tendon of lower back, initial encounter: Secondary | ICD-10-CM | POA: Diagnosis not present

## 2013-12-14 DIAGNOSIS — M545 Low back pain: Secondary | ICD-10-CM | POA: Diagnosis not present

## 2013-12-14 LAB — PREGNANCY, URINE: PREG TEST UR: NEGATIVE

## 2013-12-14 LAB — TROPONIN I: Troponin I: <0.3 ng/mL (ref <0.30)

## 2013-12-14 MED ORDER — CYCLOBENZAPRINE HCL 10 MG PO TABS: 5.0000 mg | ORAL_TABLET | Freq: Two times a day (BID) | ORAL | Status: DC | PRN

## 2013-12-14 MED ORDER — HYDROCODONE-ACETAMINOPHEN 5-325 MG PO TABS: 1.0000 | ORAL_TABLET | Freq: Four times a day (QID) | ORAL | Status: DC | PRN

## 2013-12-14 MED ORDER — IBUPROFEN 200 MG PO TABS
400.0000 mg | ORAL_TABLET | Freq: Once | ORAL | Status: AC
  Administered 2013-12-14: 400 mg via ORAL
  Filled 2013-12-14: qty 2

## 2013-12-14 NOTE — Discharge Instructions (Signed)
Please call your doctor for a followup appointment within 24-48 hours. When you talk to your doctor please let them know that you were seen in the emergency department and have them acquire all of your records so that they can discuss the findings with you and formulate a treatment plan to fully care for your new and ongoing problems. Please call and set-up an appointment with your primary care provider to be seen and re-assessed Please rest and stay hydrated Please avoid any physical or strenuous activity  Please apply warm compressions Please take medications as prescribed - while on pain medications there is to be no drinking alcohol, driving, operating any heavy machinery. If extra please dispose in a proper manner. Please do not take any extra Tylenol with this medication for this can lead to Tylenol overdose and liver issues.  While on muscle relaxers, flexeril, there is to be no drinking alcohol or driving for this can lead to drowsiness.  Please continue to monitor symptoms closely and if symptoms are to worsen or change (fever greater than 101, chills, sweating, nausea, vomiting, chest pain, shortness of breathe, difficulty breathing, weakness, numbness, tingling, worsening or changes to pain pattern, fall, injury, loss of sensation, inability to control urine or bowel movements) please report back to the Emergency Department immediately.   Angina Pectoris Angina pectoris, often just called angina, is extreme discomfort in your chest, neck, or arm caused by a lack of blood in the middle and thickest layer of your heart wall (myocardium). It may feel like tightness or heavy pressure. It may feel like a crushing or squeezing pain. Some people say it feels like gas or indigestion. It may go down your shoulders, back, and arms. Some people may have symptoms other than pain. These symptoms include fatigue, shortness of breath, cold sweats, or nausea. There are four different types of angina:  Stable  angina--Stable angina usually occurs in episodes of predictable frequency and duration. It usually is brought on by physical activity, emotional stress, or excitement. These are all times when the myocardium needs more oxygen. Stable angina usually lasts a few minutes and often is relieved by taking a medicine that can be taken under your tongue (sublingually). The medicine is called nitroglycerin. Stable angina is caused by a buildup of plaque inside the arteries, which restricts blood flow to the heart muscle (atherosclerosis).  Unstable angina--Unstable angina can occur even when your body experiences little or no physical exertion. It can occur during sleep. It can also occur at rest. It can suddenly increase in severity or frequency. It might not be relieved by sublingual nitroglycerin. It can last up to 30 minutes. The most common cause of unstable angina is a blood clot that has developed on the top of plaque buildup inside a coronary artery. It can lead to a heart attack if the blood clot completely blocks the artery.  Microvascular angina--This type of angina is caused by a disorder of tiny blood vessels called arterioles. Microvascular angina is more common in women. The pain may be more severe and last longer than other types of angina pectoris.  Prinzmetal or variant angina--This type of angina pectoris usually occurs when your body experiences little or no physical exertion. It especially occurs in the early morning hours. It is caused by a spasm of your coronary artery. HOME CARE INSTRUCTIONS   Only take over-the-counter and prescription medicines as directed by your health care provider.  Stay active or increase your exercise as directed by your  health care provider.  Limit strenuous activity as directed by your health care provider.  Limit heavy lifting as directed by your health care provider.  Maintain a healthy weight.  Learn about and eat heart-healthy foods.  Do not use any  tobacco products including cigarettes, chewing tobacco or electronic cigarettes. SEEK IMMEDIATE MEDICAL CARE IF:  You experience the following symptoms:  Chest, neck, deep shoulder, or arm pain or discomfort that lasts more than a few minutes.  Chest, neck, deep shoulder, or arm pain or discomfort that goes away and comes back, repeatedly.  Heavy sweating with discomfort, without a noticeable cause.  Shortness of breath or difficulty breathing.  Angina that does not get better after a few minutes of rest or after taking sublingual nitroglycerin. These can all be symptoms of a heart attack, which is a medical emergency! Get medical help at once. Call your local emergency service (911 in [place] immediately. Do not  drive yourself to the hospital and do not  wait to for your symptoms to go away. MAKE SURE YOU:  Understand these instructions.  Will watch your condition.  Will get help right away if you are not doing well or get worse. Document Released: 01/26/2005 Document Revised: 01/31/2013 Document Reviewed: 05/30/2013 [facility] Patient Information 2015 [place], [place]. This information is not intended to replace advice given to you by your health care provider. Make sure you discuss any questions you have with your health care provider.  Back Pain, Adult Low back pain is very common. About 1 in 5 people have back pain.The cause of low back pain is rarely dangerous. The pain often gets better over time.About half of people with a sudden onset of back pain feel better in just 2 weeks. About 8 in 10 people feel better by 6 weeks.  CAUSES Some common causes of back pain include:  Strain of the muscles or ligaments supporting the spine.  Wear and tear (degeneration) of the spinal discs.  Arthritis.  Direct injury to the back. DIAGNOSIS Most of the time, the direct cause of low back pain is not known.However, back pain can be treated effectively even when the exact cause of the pain  is unknown.Answering your caregiver's questions about your overall health and symptoms is one of the most accurate ways to make sure the cause of your pain is not dangerous. If your caregiver needs more information, he or she may order lab work or imaging tests (X-rays or MRIs).However, even if imaging tests show changes in your back, this usually does not require surgery. HOME CARE INSTRUCTIONS For many people, back pain returns.Since low back pain is rarely dangerous, it is often a condition that people can learn to [facility] their own.   Remain active. It is stressful on the back to sit or stand in one place. Do not sit, drive, or stand in one place for more than 30 minutes at a time. Take short walks on level surfaces as soon as pain allows.Try to increase the length of time you walk each day.  Do not stay in bed.Resting more than 1 or 2 days can delay your recovery.  Do not avoid exercise or work.Your body is made to move.It is not dangerous to be active, even though your back may hurt.Your back will likely heal faster if you return to being active before your pain is gone.  Pay attention to your body when you bend and lift. Many people have less discomfortwhen lifting if they bend their knees, keep  the load close to their bodies,and avoid twisting. Often, the most comfortable positions are those that put less stress on your recovering back.  Find a comfortable position to sleep. Use a firm mattress and lie on your side with your knees slightly bent. If you lie on your back, put a pillow under your knees.  Only take over-the-counter or prescription medicines as directed by your caregiver. Over-the-counter medicines to reduce pain and inflammation are often the most helpful.Your caregiver may prescribe muscle relaxant drugs.These medicines help dull your pain so you can more quickly return to your normal activities and healthy exercise.  Put ice on the injured area.  Put ice in a  plastic bag.  Place a towel between your skin and the bag.  Leave the ice on for 15-20 minutes, 03-04 times a day for the first 2 to 3 days. After that, ice and heat may be alternated to reduce pain and spasms.  Ask your caregiver about trying back exercises and gentle massage. This may be of some benefit.  Avoid feeling anxious or stressed.Stress increases muscle tension and can worsen back pain.It is important to recognize when you are anxious or stressed and learn ways to manage it.Exercise is a great option. SEEK MEDICAL CARE IF:  You have pain that is not relieved with rest or medicine.  You have pain that does not improve in 1 week.  You have new symptoms.  You are generally not feeling well. SEEK IMMEDIATE MEDICAL CARE IF:   You have pain that radiates from your back into your legs.  You develop new bowel or bladder control problems.  You have unusual weakness or numbness in your arms or legs.  You develop nausea or vomiting.  You develop abdominal pain.  You feel faint. Document Released: 01/26/2005 Document Revised: 07/28/2011 Document Reviewed: 05/30/2013 [facility] Patient Information 2015 [place], [place]. This information is not intended to replace advice given to you by your health care provider. Make sure you discuss any questions you have with your health care provider.

## 2013-12-18 ENCOUNTER — Other Ambulatory Visit (HOSPITAL_COMMUNITY): Payer: Self-pay | Admitting: Cardiology

## 2013-12-18 DIAGNOSIS — R0789 Other chest pain: Secondary | ICD-10-CM | POA: Diagnosis not present

## 2013-12-18 DIAGNOSIS — F17290 Nicotine dependence, other tobacco product, uncomplicated: Secondary | ICD-10-CM | POA: Diagnosis not present

## 2013-12-18 DIAGNOSIS — I10 Essential (primary) hypertension: Secondary | ICD-10-CM | POA: Diagnosis not present

## 2013-12-18 DIAGNOSIS — E785 Hyperlipidemia, unspecified: Secondary | ICD-10-CM | POA: Diagnosis not present

## 2013-12-18 DIAGNOSIS — E669 Obesity, unspecified: Secondary | ICD-10-CM | POA: Diagnosis not present

## 2013-12-18 DIAGNOSIS — R079 Chest pain, unspecified: Secondary | ICD-10-CM

## 2013-12-18 DIAGNOSIS — K219 Gastro-esophageal reflux disease without esophagitis: Secondary | ICD-10-CM | POA: Diagnosis not present

## 2013-12-25 DIAGNOSIS — I10 Essential (primary) hypertension: Secondary | ICD-10-CM | POA: Diagnosis not present

## 2013-12-25 DIAGNOSIS — M25561 Pain in right knee: Secondary | ICD-10-CM | POA: Diagnosis not present

## 2013-12-27 ENCOUNTER — Encounter (HOSPITAL_COMMUNITY)
Admission: RE | Admit: 2013-12-27 | Discharge: 2013-12-27 | Disposition: A | Discharge status: Home / Self Care | Payer: Medicare Other | Source: Ambulatory Visit | Attending: Cardiology | Admitting: Cardiology

## 2013-12-27 ENCOUNTER — Encounter (HOSPITAL_COMMUNITY): Admission: RE | Admit: 2013-12-27 | Payer: Medicare Other | Source: Ambulatory Visit

## 2013-12-27 ENCOUNTER — Encounter (HOSPITAL_COMMUNITY): Payer: Medicare Other

## 2013-12-27 MED ORDER — REGADENOSON 0.4 MG/5ML IV SOLN: 0.4000 mg | Freq: Once | INTRAVENOUS | Status: DC

## 2014-01-08 ENCOUNTER — Encounter (HOSPITAL_COMMUNITY): Payer: Medicare Other

## 2014-01-17 ENCOUNTER — Encounter (HOSPITAL_COMMUNITY): Payer: Medicare Other

## 2014-01-17 ENCOUNTER — Encounter (HOSPITAL_COMMUNITY): Admission: RE | Admit: 2014-01-17 | Payer: Medicare Other | Source: Ambulatory Visit

## 2014-04-22 IMAGING — CR DG ABDOMEN 1V
2 series · 2 of 2 positions shown · non-contrast
Comparison: None.

CLINICAL DATA: Generalized abdominal pain.

ABDOMEN - 1 VIEW

[view not recorded (1 of 2)]
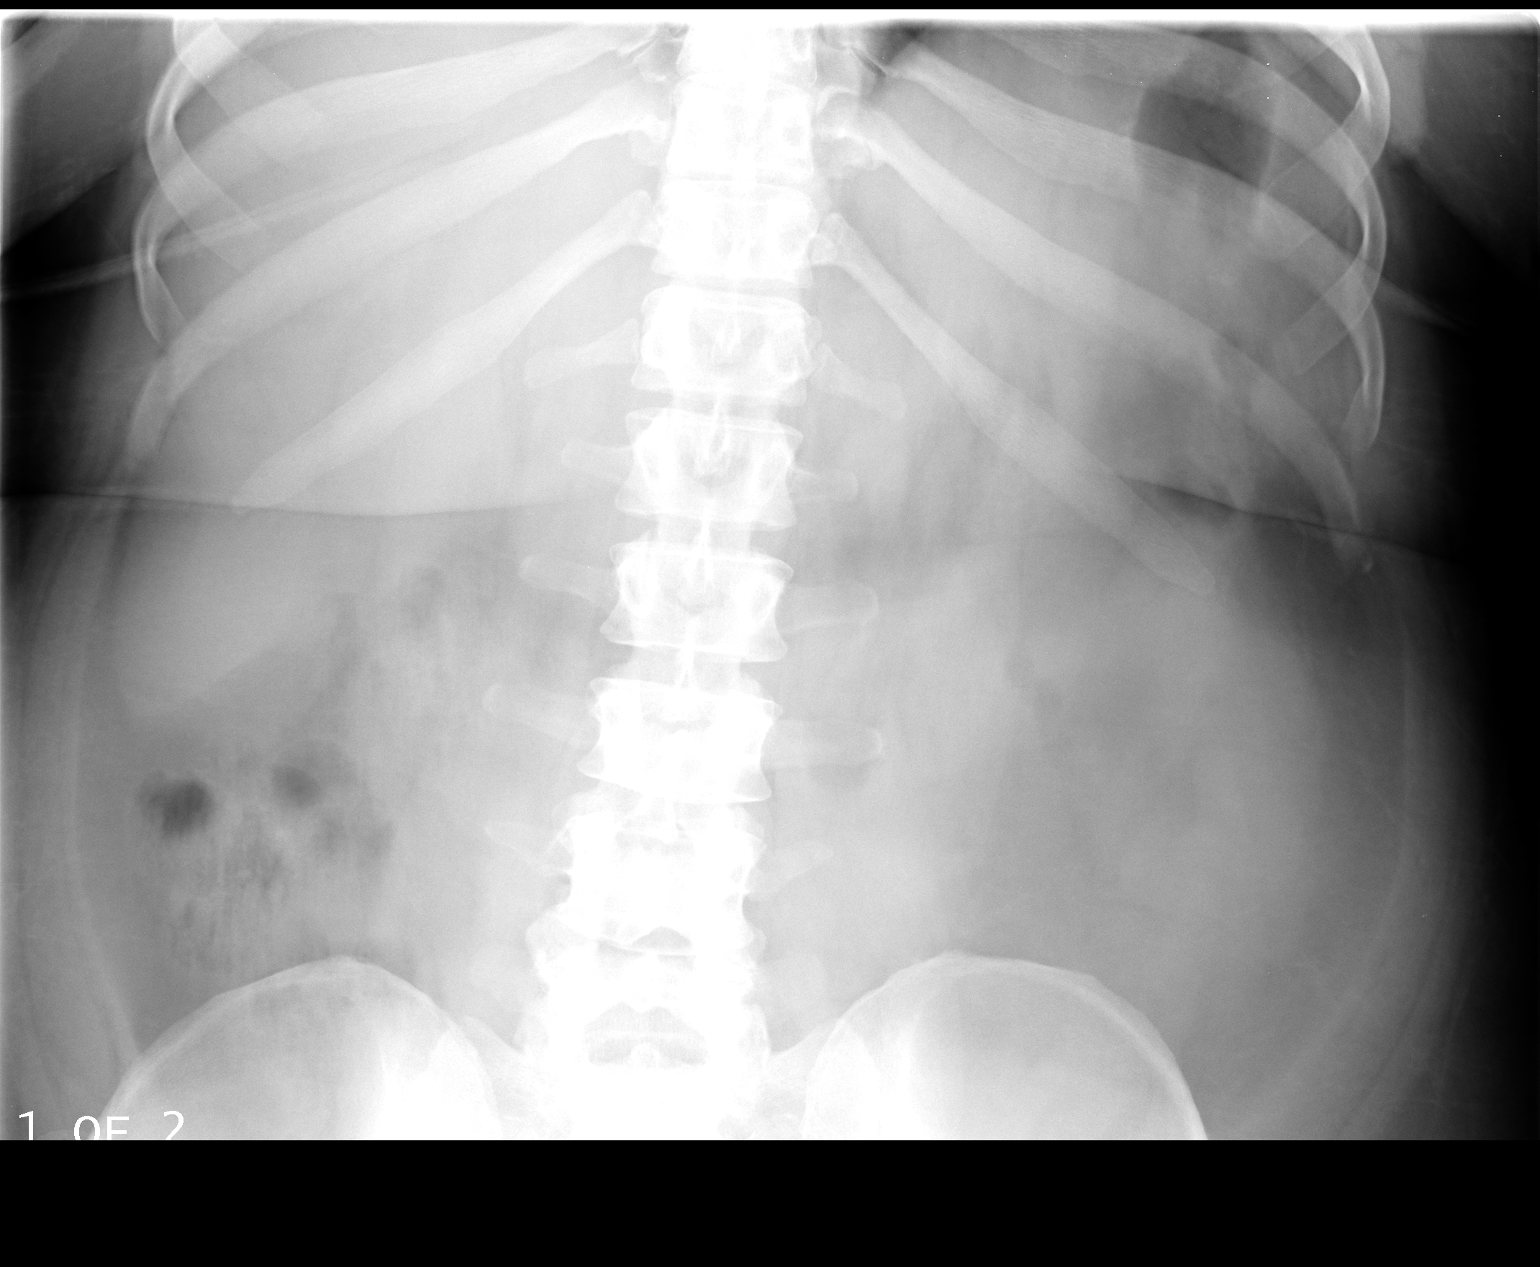

[view not recorded (2 of 2)]
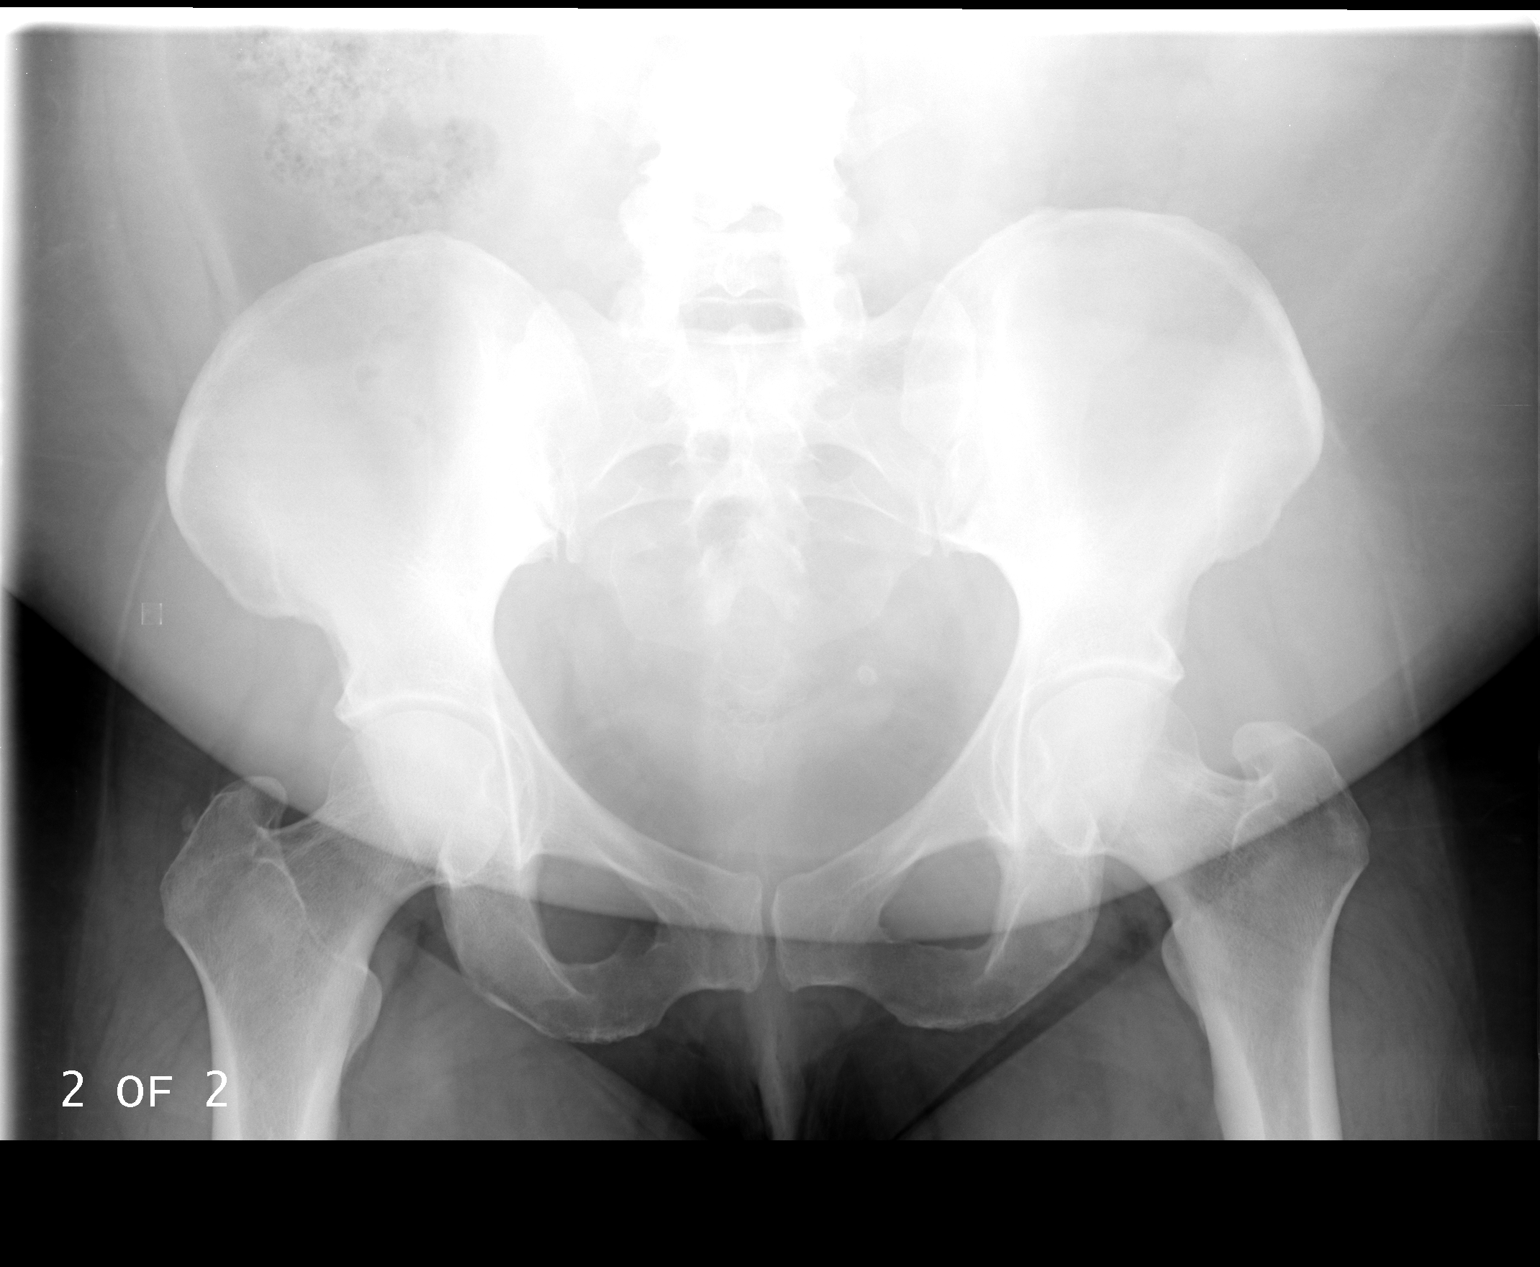

[2 of 2 positions shown; findings below may reference images not displayed]

FINDINGS: The bowel gas pattern is unremarkable.  There is no
evidence for obstruction or free air.  The axial skeleton is within
normal limits.
IMPRESSION: Negative abdomen.

## 2014-08-29 DIAGNOSIS — Z124 Encounter for screening for malignant neoplasm of cervix: Secondary | ICD-10-CM | POA: Diagnosis not present

## 2014-08-29 DIAGNOSIS — R52 Pain, unspecified: Secondary | ICD-10-CM | POA: Diagnosis not present

## 2014-08-29 DIAGNOSIS — N390 Urinary tract infection, site not specified: Secondary | ICD-10-CM | POA: Diagnosis not present

## 2014-08-29 DIAGNOSIS — K210 Gastro-esophageal reflux disease with esophagitis: Secondary | ICD-10-CM | POA: Diagnosis not present

## 2014-08-29 DIAGNOSIS — E785 Hyperlipidemia, unspecified: Secondary | ICD-10-CM | POA: Diagnosis not present

## 2014-08-29 DIAGNOSIS — I10 Essential (primary) hypertension: Secondary | ICD-10-CM | POA: Diagnosis not present

## 2014-08-29 DIAGNOSIS — K219 Gastro-esophageal reflux disease without esophagitis: Secondary | ICD-10-CM | POA: Diagnosis not present

## 2014-11-09 ENCOUNTER — Ambulatory Visit (INDEPENDENT_AMBULATORY_CARE_PROVIDER_SITE_OTHER): Payer: Medicare Other | Admitting: Podiatry

## 2014-11-09 ENCOUNTER — Encounter: Payer: Self-pay | Admitting: Podiatry

## 2014-11-09 VITALS — BP 139/68 | HR 67 | Ht 64.0 in | Wt 280.0 lb

## 2014-11-09 DIAGNOSIS — M216X9 Other acquired deformities of unspecified foot: Secondary | ICD-10-CM | POA: Diagnosis not present

## 2014-11-09 DIAGNOSIS — M79673 Pain in unspecified foot: Secondary | ICD-10-CM

## 2014-11-09 DIAGNOSIS — M21969 Unspecified acquired deformity of unspecified lower leg: Secondary | ICD-10-CM | POA: Diagnosis not present

## 2014-11-09 MED ORDER — HYDROCODONE-ACETAMINOPHEN 5-325 MG PO TABS: 2.0000 | ORAL_TABLET | Freq: Four times a day (QID) | ORAL | Status: DC | PRN

## 2014-11-09 NOTE — Progress Notes (Signed)
Subjective: 49 year old female presents complaining of pain in both feet for about a month. The pain has been off and on over a year.  Pain is on side of foot, R>L. Now cannot stand long period of time. The same pain keeps coming back.  During her last visit to the office she was referred out for physical therapy. Stated that she did not follow through.  Objective: Severe ankle equinus bilateral with severe pain on right. Pain on mid lateral column of foot bilateral.  Neurovascular status are within normal.  Orthopedic: Severe rearfoot varus with elevated first ray bilateral.   Assessment: Tenosynovitis bilateral secondary to faulty biomechanics.  Ankle Equinus bilateral. Elevated first ray bilateral.   Plan: Reviewed findings and available treatment options.  Referral to physical therapy to stretch Achilles tendon bilateral.  OTC orthotics dispensed. Advised to change shoe to lace up tennis shoes.  Hydrocodone 5/325 dispensed as per request. Return in 3 weeks.

## 2014-11-09 NOTE — Patient Instructions (Signed)
Seen for bilateral foot pain. Discussed physical therapy for Achilles tendon stretch. Also use OTC orthotics in lace up shoes. Return in 3 weeks.

## 2014-11-13 DIAGNOSIS — M1712 Unilateral primary osteoarthritis, left knee: Secondary | ICD-10-CM | POA: Diagnosis not present

## 2014-11-13 DIAGNOSIS — M5442 Lumbago with sciatica, left side: Secondary | ICD-10-CM | POA: Diagnosis not present

## 2014-11-21 ENCOUNTER — Other Ambulatory Visit: Payer: Self-pay | Admitting: Surgical

## 2014-11-21 ENCOUNTER — Ambulatory Visit: Payer: Medicare Other | Attending: Podiatry | Admitting: Physical Therapy

## 2014-11-21 DIAGNOSIS — M545 Low back pain, unspecified: Secondary | ICD-10-CM

## 2014-11-29 ENCOUNTER — Ambulatory Visit: Payer: Medicare Other | Admitting: Podiatry

## 2014-12-04 ENCOUNTER — Ambulatory Visit: Payer: Medicare Other | Admitting: Physical Therapy

## 2014-12-08 ENCOUNTER — Other Ambulatory Visit: Payer: Medicare Other

## 2014-12-11 ENCOUNTER — Ambulatory Visit: Payer: Medicare Other | Admitting: Podiatry

## 2015-01-09 ENCOUNTER — Ambulatory Visit: Payer: Medicare Other | Attending: Podiatry | Admitting: Physical Therapy

## 2015-07-04 ENCOUNTER — Ambulatory Visit (INDEPENDENT_AMBULATORY_CARE_PROVIDER_SITE_OTHER): Payer: Medicare Other | Admitting: Podiatry

## 2015-07-04 ENCOUNTER — Encounter: Payer: Self-pay | Admitting: Podiatry

## 2015-07-04 VITALS — BP 136/79 | HR 71

## 2015-07-04 DIAGNOSIS — M216X9 Other acquired deformities of unspecified foot: Secondary | ICD-10-CM | POA: Diagnosis not present

## 2015-07-04 DIAGNOSIS — M79673 Pain in unspecified foot: Secondary | ICD-10-CM

## 2015-07-04 DIAGNOSIS — M21969 Unspecified acquired deformity of unspecified lower leg: Secondary | ICD-10-CM | POA: Diagnosis not present

## 2015-07-04 MED ORDER — NABUMETONE 500 MG PO TABS: 500.0000 mg | ORAL_TABLET | Freq: Two times a day (BID) | ORAL | Status: DC

## 2015-07-04 MED ORDER — HYDROCODONE-ACETAMINOPHEN 5-325 MG PO TABS: 1.0000 | ORAL_TABLET | Freq: Four times a day (QID) | ORAL | Status: DC | PRN

## 2015-07-04 NOTE — Patient Instructions (Signed)
Seen for painful feet. Continue with stretch exercise, use Orthotics, and medication as needed. Return in one month.

## 2015-07-04 NOTE — Progress Notes (Signed)
Subjective: 50 year old female presents complaining of pain with knot forming on top of left foot at lateral dorsal aspect, also pain on heel of right foot duration of several weeks. Taking Advil but not helping. When wakes up in the morning, barely can walk.  This is similar pain as the last visit in September 2016  but has more intensity. On feet just at home doing daily chores.  Taking Flexeril since gotten foot pain that radiates to leg and back.  Wears OTC shoe inserts.  Objective: Severe ankle equinus bilateral with severe pain on right heel. Pain on mid lateral column of left foot. Neurovascular status are within normal.  Orthopedic: Severe rearfoot varus with elevated first ray bilateral.  Tight Achilles tendon bilateral.   Assessment: Plantar fasciitis right.  Tenosynovitis dorsolateral left faulty biomechanics.  Hyperpronation bilateral. Ankle Equinus bilateral. Elevated first ray bilateral.   Plan: Reviewed findings and available treatment options.  Continue with exercise to stretch Achilles tendon bilateral.  Reviewed possible surgical options, Lapidus fusion of the first MCJ on both feet.  Hydrocodone 5/325 dispensed as per request. Return in one month.

## 2015-08-01 ENCOUNTER — Ambulatory Visit: Payer: Medicare Other | Admitting: Podiatry

## 2015-08-02 ENCOUNTER — Encounter: Payer: Self-pay | Admitting: Podiatry

## 2015-08-02 ENCOUNTER — Ambulatory Visit (INDEPENDENT_AMBULATORY_CARE_PROVIDER_SITE_OTHER): Payer: Medicare Other | Admitting: Podiatry

## 2015-08-02 VITALS — BP 142/77 | HR 71

## 2015-08-02 DIAGNOSIS — M216X9 Other acquired deformities of unspecified foot: Secondary | ICD-10-CM

## 2015-08-02 DIAGNOSIS — M79673 Pain in unspecified foot: Secondary | ICD-10-CM

## 2015-08-02 DIAGNOSIS — M21969 Unspecified acquired deformity of unspecified lower leg: Secondary | ICD-10-CM

## 2015-08-02 MED ORDER — HYDROCODONE-ACETAMINOPHEN 10-325 MG PO TABS: 1.0000 | ORAL_TABLET | Freq: Two times a day (BID) | ORAL | Status: DC | PRN

## 2015-08-02 NOTE — Progress Notes (Signed)
Subjective: 50 year old female presents complaining of pain in right foot, on dorsum and heel. Left foot also hurts and give spasm. Not able to have any surgery done at this time.  Stated that she does well with pain medication.  Stretch exercise made her right heel feel tighter.  Having soft knot on top of right foot. Left foot soft know painful with light pressure.  This painful condition started in September 2016 and on going off and on.  Taking Advil but not helping. Feet are sore in the morning to place weight on.  Been taking pain medication 4 a day with 5 mg percocet.  Objective: Severe ankle equinus bilateral with severe pain on right heel. Pain on mid lateral column of left foot. Neurovascular status are within normal.  Orthopedic: Severe rearfoot varus with elevated first ray bilateral.  Tight Achilles tendon bilateral.  Palpable ganglionic mass over mid tarsal bone just distal to ankle joint bilateral  Assessment: Plantar fasciitis right.  Tenosynovitis dorsolateral left faulty biomechanics.  Symptomatic Ganglionic mass dorsum over tarsal bone bilateral.  Hyperpronation bilateral. Ankle Equinus bilateral. Elevated first ray bilateral.   Plan: Reviewed findings and available treatment options.  Continue with exercise to stretch Achilles tendon bilateral.  Previously reviewed possible benefit of Lapidus fusion of the first MCJ on both feet.  As per request Rx given for Hydrocodone 10/325 to take twice daily prn. May benefit from Pain management consultation.  Return as needed.

## 2015-08-02 NOTE — Patient Instructions (Signed)
Seen for painful feet. Medication prescribed as per request.

## 2015-09-13 ENCOUNTER — Other Ambulatory Visit: Payer: Self-pay | Admitting: Cardiology

## 2015-09-13 DIAGNOSIS — R079 Chest pain, unspecified: Secondary | ICD-10-CM | POA: Diagnosis not present

## 2015-09-13 DIAGNOSIS — F17290 Nicotine dependence, other tobacco product, uncomplicated: Secondary | ICD-10-CM | POA: Diagnosis not present

## 2015-09-13 DIAGNOSIS — R0609 Other forms of dyspnea: Secondary | ICD-10-CM | POA: Diagnosis not present

## 2015-09-13 DIAGNOSIS — I2510 Atherosclerotic heart disease of native coronary artery without angina pectoris: Secondary | ICD-10-CM | POA: Diagnosis not present

## 2015-09-13 DIAGNOSIS — E669 Obesity, unspecified: Secondary | ICD-10-CM | POA: Diagnosis not present

## 2015-09-13 DIAGNOSIS — I10 Essential (primary) hypertension: Secondary | ICD-10-CM | POA: Diagnosis not present

## 2015-09-13 DIAGNOSIS — E785 Hyperlipidemia, unspecified: Secondary | ICD-10-CM | POA: Diagnosis not present

## 2015-09-17 DIAGNOSIS — R002 Palpitations: Secondary | ICD-10-CM | POA: Diagnosis not present

## 2015-09-17 DIAGNOSIS — R0602 Shortness of breath: Secondary | ICD-10-CM | POA: Diagnosis not present

## 2015-09-17 DIAGNOSIS — R079 Chest pain, unspecified: Secondary | ICD-10-CM | POA: Diagnosis not present

## 2015-09-19 DIAGNOSIS — R0609 Other forms of dyspnea: Secondary | ICD-10-CM | POA: Diagnosis not present

## 2015-09-19 DIAGNOSIS — R079 Chest pain, unspecified: Secondary | ICD-10-CM | POA: Diagnosis not present

## 2015-09-19 DIAGNOSIS — F17290 Nicotine dependence, other tobacco product, uncomplicated: Secondary | ICD-10-CM | POA: Diagnosis not present

## 2015-09-19 DIAGNOSIS — Z8249 Family history of ischemic heart disease and other diseases of the circulatory system: Secondary | ICD-10-CM | POA: Diagnosis not present

## 2015-09-19 DIAGNOSIS — E669 Obesity, unspecified: Secondary | ICD-10-CM | POA: Diagnosis not present

## 2015-09-19 DIAGNOSIS — I2510 Atherosclerotic heart disease of native coronary artery without angina pectoris: Secondary | ICD-10-CM | POA: Diagnosis not present

## 2015-09-19 DIAGNOSIS — R609 Edema, unspecified: Secondary | ICD-10-CM | POA: Diagnosis not present

## 2015-09-19 DIAGNOSIS — I10 Essential (primary) hypertension: Secondary | ICD-10-CM | POA: Diagnosis not present

## 2015-09-19 DIAGNOSIS — E785 Hyperlipidemia, unspecified: Secondary | ICD-10-CM | POA: Diagnosis not present

## 2015-09-19 DIAGNOSIS — R002 Palpitations: Secondary | ICD-10-CM | POA: Diagnosis not present

## 2015-09-20 ENCOUNTER — Encounter (HOSPITAL_COMMUNITY)
Admission: RE | Admit: 2015-09-20 | Discharge: 2015-09-20 | Disposition: A | Discharge status: Home / Self Care | Payer: Medicare Other | Source: Ambulatory Visit | Attending: Cardiology | Admitting: Cardiology

## 2015-09-20 ENCOUNTER — Encounter (HOSPITAL_COMMUNITY): Admission: RE | Admit: 2015-09-20 | Payer: Medicare Other | Source: Ambulatory Visit

## 2015-09-20 ENCOUNTER — Encounter (HOSPITAL_COMMUNITY): Payer: Medicare Other

## 2015-09-20 ENCOUNTER — Encounter (HOSPITAL_COMMUNITY): Payer: Self-pay

## 2015-09-20 DIAGNOSIS — R079 Chest pain, unspecified: Secondary | ICD-10-CM

## 2015-09-27 ENCOUNTER — Encounter (HOSPITAL_COMMUNITY): Admission: RE | Admit: 2015-09-27 | Payer: Medicare Other | Source: Ambulatory Visit

## 2015-09-27 ENCOUNTER — Encounter (HOSPITAL_COMMUNITY): Payer: Medicare Other

## 2015-10-07 ENCOUNTER — Encounter (HOSPITAL_COMMUNITY): Admission: RE | Admit: 2015-10-07 | Payer: Medicare Other | Source: Ambulatory Visit

## 2015-10-07 ENCOUNTER — Encounter (HOSPITAL_COMMUNITY)
Admission: RE | Admit: 2015-10-07 | Discharge: 2015-10-07 | Disposition: A | Discharge status: Home / Self Care | Payer: Medicare Other | Source: Ambulatory Visit | Attending: Cardiology | Admitting: Cardiology

## 2015-10-07 ENCOUNTER — Encounter (HOSPITAL_COMMUNITY): Payer: Self-pay

## 2015-10-07 MED ORDER — TECHNETIUM TC 99M TETROFOSMIN IV KIT
10.0000 | PACK | Freq: Once | INTRAVENOUS | Status: AC | PRN
  Administered 2015-10-07: 10 via INTRAVENOUS

## 2015-10-07 MED ORDER — REGADENOSON 0.4 MG/5ML IV SOLN: 0.4000 mg | Freq: Once | INTRAVENOUS | Status: DC

## 2015-10-08 DIAGNOSIS — R0609 Other forms of dyspnea: Secondary | ICD-10-CM | POA: Diagnosis not present

## 2015-10-08 DIAGNOSIS — F17290 Nicotine dependence, other tobacco product, uncomplicated: Secondary | ICD-10-CM | POA: Diagnosis not present

## 2015-10-08 DIAGNOSIS — E785 Hyperlipidemia, unspecified: Secondary | ICD-10-CM | POA: Diagnosis not present

## 2015-10-08 DIAGNOSIS — R079 Chest pain, unspecified: Secondary | ICD-10-CM | POA: Diagnosis not present

## 2015-10-08 DIAGNOSIS — E669 Obesity, unspecified: Secondary | ICD-10-CM | POA: Diagnosis not present

## 2015-10-13 ENCOUNTER — Encounter (HOSPITAL_COMMUNITY): Payer: Self-pay

## 2015-10-13 ENCOUNTER — Emergency Department (HOSPITAL_COMMUNITY)
Admission: EM | Admit: 2015-10-13 | Discharge: 2015-10-13 | Disposition: A | Discharge status: Home / Self Care | Payer: Medicare Other | Attending: Emergency Medicine | Admitting: Emergency Medicine

## 2015-10-13 DIAGNOSIS — X500XXA Overexertion from strenuous movement or load, initial encounter: Secondary | ICD-10-CM | POA: Insufficient documentation

## 2015-10-13 DIAGNOSIS — Y929 Unspecified place or not applicable: Secondary | ICD-10-CM | POA: Insufficient documentation

## 2015-10-13 DIAGNOSIS — Z79899 Other long term (current) drug therapy: Secondary | ICD-10-CM | POA: Diagnosis not present

## 2015-10-13 DIAGNOSIS — Y999 Unspecified external cause status: Secondary | ICD-10-CM | POA: Insufficient documentation

## 2015-10-13 DIAGNOSIS — S39012A Strain of muscle, fascia and tendon of lower back, initial encounter: Secondary | ICD-10-CM

## 2015-10-13 DIAGNOSIS — S3992XA Unspecified injury of lower back, initial encounter: Secondary | ICD-10-CM | POA: Diagnosis present

## 2015-10-13 DIAGNOSIS — I10 Essential (primary) hypertension: Secondary | ICD-10-CM | POA: Diagnosis not present

## 2015-10-13 DIAGNOSIS — Y939 Activity, unspecified: Secondary | ICD-10-CM | POA: Diagnosis not present

## 2015-10-13 LAB — URINALYSIS, ROUTINE W REFLEX MICROSCOPIC
Bilirubin Urine: NEGATIVE
Glucose, UA: NEGATIVE mg/dL
Hgb urine dipstick: NEGATIVE
KETONES UR: NEGATIVE mg/dL
NITRITE: NEGATIVE
PROTEIN: NEGATIVE mg/dL
Specific Gravity, Urine: 1.023 (ref 1.005–1.030)
pH: 6.5 (ref 5.0–8.0)

## 2015-10-13 LAB — URINE MICROSCOPIC-ADD ON

## 2015-10-13 LAB — POC URINE PREG, ED: PREG TEST UR: NEGATIVE

## 2015-10-13 MED ORDER — CYCLOBENZAPRINE HCL 10 MG PO TABS: 10.0000 mg | ORAL_TABLET | Freq: Two times a day (BID) | ORAL | 0 refills | Status: DC | PRN

## 2015-10-13 MED ORDER — TRAMADOL HCL 50 MG PO TABS: 50.0000 mg | ORAL_TABLET | Freq: Four times a day (QID) | ORAL | 0 refills | Status: DC | PRN

## 2015-10-13 MED ORDER — LIDOCAINE 5 % EX PTCH
1.0000 | MEDICATED_PATCH | CUTANEOUS | Status: DC
  Administered 2015-10-13: 1 via TRANSDERMAL
  Filled 2015-10-13: qty 1

## 2015-10-13 MED ORDER — LIDOCAINE 5 % EX PTCH
1.0000 | MEDICATED_PATCH | CUTANEOUS | Status: DC
  Filled 2015-10-13: qty 1

## 2015-10-13 NOTE — ED Notes (Signed)
Patient Alert and oriented X4. Stable and ambulatory. Patient verbalized understanding of the discharge instructions.  Patient belongings were taken by the patient.  

## 2015-10-13 NOTE — ED Provider Notes (Signed)
[facility] DEPT Provider Note   CSN: [id number] Arrival date & time: 10/13/15  1520     History   Chief Complaint Chief Complaint  Patient presents with  . Flank Pain    HPI Sarah Mathews is a 50 y.o. female who presents with left-sided leg pain. Past medical history significant for palpitations, obesity, chronic low back pain, hypertension, GERD, chronic left knee pain. She is currently being followed by cardiology and has been wearing a Holter monitor due to palpitations. She was told by cardiology to not overly exert herself however she states she has been doing day-to-day chores lifting bending, etc. The pain is on the left side of her back, does not radiate, feels a sharp shooting pain, is constant and worse with movement. Denies fever, chills, chest pain, shortness of breath, abdominal pain, nausea, vomiting, diarrhea, irritative voiding symptoms, vaginal discharge, vaginal bleeding. She has not tried anything for pain. No syncope, acute trauma or falls, unexplained weight loss, hx of cancer, loss of bowel/bladder function, saddle anesthesia, urinary retention, IVDU.   HPI  Past Medical History:  Diagnosis Date  . Depressed   . GERD (gastroesophageal reflux disease)   . Hypertension   . Obesity     Patient Active Problem List   Diagnosis Date Noted  . Metatarsal deformity 11/09/2014  . Pain, foot 02/07/2013  . Equinus deformity of foot, acquired 02/07/2013  . Tenosynovitis of foot and ankle 02/07/2013    Past Surgical History:  Procedure Laterality Date  . DILATION AND CURETTAGE OF UTERUS  09/2006  . TUBAL LIGATION      OB History    No data available       Home Medications    Prior to Admission medications   Medication Sig Start Date End Date Taking? Authorizing Provider  albuterol (PROVENTIL HFA;VENTOLIN HFA) 108 (90 BASE) MCG/ACT inhaler Inhale 2 puffs into the lungs every 6 (six) hours as needed for wheezing or shortness of breath. 12/27/12   Janne Napoleon, NP  cyclobenzaprine (FLEXERIL) 10 MG tablet Take 0.5 tablets (5 mg total) by mouth 2 (two) times daily as needed for muscle spasms. 12/14/13   Marissa Sciacca, PA-C  doxycycline (VIBRA-TABS) 100 MG tablet Take 100 mg by mouth daily.    Historical Provider, MD  famotidine (PEPCID) 20 MG tablet Take 1 tablet (20 mg total) by mouth 2 (two) times daily. 10/22/11 10/21/12  Drenda Freeze, MD  hydrochlorothiazide (HYDRODIURIL) 25 MG tablet Take 25 mg by mouth daily.    Historical Provider, MD  HYDROcodone-acetaminophen (NORCO) 10-325 MG tablet Take 1 tablet by mouth 2 (two) times daily as needed. 08/02/15   Myeong O Sheard, DPM  HYDROcodone-ibuprofen (VICOPROFEN) 7.5-200 MG per tablet Take 1 tablet by mouth every 8 (eight) hours as needed for moderate pain. 02/07/13   Myeong O Sheard, DPM  methocarbamol (ROBAXIN) 500 MG tablet Take 500 mg by mouth every 8 (eight) hours as needed. 12/01/12   Harden Mo, MD  nabumetone (RELAFEN) 500 MG tablet Take 1 tablet (500 mg total) by mouth 2 (two) times daily. 07/04/15   Myeong O Sheard, DPM  PredniSONE 10 MG KIT 12 day dose pack po 03/21/13   Gregor Hams, MD  Pseudoephedrine-Ibuprofen (ADVIL COLD & SINUS LIQUI-GELS) 30-200 MG CAPS Take 2 capsules by mouth every 6 (six) hours as needed. For pain/cold/allergy symptoms    Historical Provider, MD    Family History Family History  Problem Relation Age of Onset  . Heart failure Mother  Social History Social History  Substance Use Topics  . Smoking status: Never Smoker  . Smokeless tobacco: Never Used  . Alcohol use 0.0 oz/week     Comment: occasional     Allergies   Darvocet [propoxyphene n-acetaminophen]; Darvon [propoxyphene hcl]; Percocet [oxycodone-acetaminophen]; and Stadol [butorphanol]   Review of Systems Review of Systems  Constitutional: Negative for chills and fever.  Respiratory: Negative for shortness of breath.   Cardiovascular: Negative for chest pain.  Gastrointestinal:  Negative for abdominal pain, diarrhea, nausea and vomiting.  Genitourinary: Positive for flank pain.  Musculoskeletal: Positive for back pain. Negative for gait problem and neck pain.  Neurological: Negative for weakness and numbness.  All other systems reviewed and are negative.    Physical Exam Updated Vital Signs BP 151/81 (BP Location: Right Wrist)   Pulse 63   Temp 98.3 F (36.8 C) (Oral)   Resp 14   LMP 09/18/2015   SpO2 100%   Physical Exam  Constitutional: She is oriented to person, place, and time. She appears well-developed and well-nourished. No distress.  Pleasant obese female in NAD  HENT:  Head: Normocephalic and atraumatic.  Eyes: Conjunctivae are normal. Pupils are equal, round, and reactive to light. Right eye exhibits no discharge. Left eye exhibits no discharge. No scleral icterus.  Neck: Normal range of motion. Neck supple.  Cardiovascular: Normal rate and regular rhythm.  Exam reveals no gallop and no friction rub.   No murmur heard. Pulmonary/Chest: Effort normal and breath sounds normal. No respiratory distress. She has no wheezes. She has no rales. She exhibits no tenderness.  Abdominal: Soft. Bowel sounds are normal. She exhibits no distension and no mass. There is no tenderness. There is no rebound and no guarding.  Musculoskeletal: She exhibits no edema.  Inspection: No masses, deformity, or rash Palpation: No midline spinal tenderness. No paraspinal muscle tenderness. Left flank tenderness without CVA tenderness. ROM: Pain with ROM of back. Strength: 5/5 in lower extremities and normal plantar and dorsiflexion Sensation: Intact sensation with light touch in lower extremities bilaterally Gait: Normal gait Reflexes: Patellar reflex is 2+ bilaterally, Achilles is 2+ bilaterally SLR: Negative seated straight leg raise    Neurological: She is alert and oriented to person, place, and time.  Skin: Skin is warm and dry.  Psychiatric: She has a normal mood  and affect. Her behavior is normal.  Nursing note and vitals reviewed.    ED Treatments / Results  Labs (all labs ordered are listed, but only abnormal results are displayed) Labs Reviewed  URINALYSIS, ROUTINE W REFLEX MICROSCOPIC (NOT AT [facility]) - Abnormal; Notable for the following:       Result Value   APPearance CLOUDY (*)    Leukocytes, UA MODERATE (*)    All other components within normal limits  URINE MICROSCOPIC-ADD ON - Abnormal; Notable for the following:    Squamous Epithelial / LPF 6-30 (*)    Bacteria, UA FEW (*)    All other components within normal limits  POC URINE PREG, ED    EKG  EKG Interpretation None       Radiology No results found.  Procedures Procedures (including critical care time)  Medications Ordered in ED Medications  lidocaine (LIDODERM) 5 % 1 patch (not administered)     Initial Impression / Assessment and Plan / ED Course  I have reviewed the triage vital signs and the nursing notes.  Pertinent labs & imaging results that were available during my care of the patient were reviewed  by me and considered in my medical decision making (see chart for details).  Clinical Course   50 year old female presents with flank pain which is most likely MSK in etiology. Patient is afebrile, not tachycardic or tachypneic, normotensive, and not hypoxic. No red flag back pain signs/symptoms. No midline tenderness or acute injury. No fever or urinary symptoms and patient appears comfortable. Imaging not indicated at this time. UA has few bacteria with moderate leukocytes but appears contaminated and patient denies urinary complaints. Preg test negative. CBC, CMP, Lipase ordered however after several blood draw attempt by nursing patient is refusing further attempts. Do not feel that any urgent/emergent pathology is present at this time as patient is comfortable with stable vital signs. Will d/c with pain medicine and muscle relaxer as patient states she cannot  take Ibuprofen due to her daily ASA. Lidocaine patch given in ED. Return precautions given and patient verbalizes understanding.  Final Clinical Impressions(s) / ED Diagnoses   Final diagnoses:  Lumbar strain, initial encounter    New Prescriptions New Prescriptions   CYCLOBENZAPRINE (FLEXERIL) 10 MG TABLET    Take 1 tablet (10 mg total) by mouth 2 (two) times daily as needed for muscle spasms.   TRAMADOL (ULTRAM) 50 MG TABLET    Take 1 tablet (50 mg total) by mouth every 6 (six) hours as needed.     Recardo Evangelist, PA-C 10/13/15 1923    Isla Pence, MD 10/13/15 [phone]

## 2015-10-13 NOTE — Discharge Instructions (Signed)
Use ice or heat as needed for 20 minutes at a time. Please return if your symptoms are worsening.

## 2015-10-13 NOTE — ED Notes (Signed)
Lidoderm reordered per [place] PA

## 2015-10-13 NOTE — ED Notes (Signed)
This RN and phlebotomy attempted to get lab work from patient 2 times in total. Patient states "I do not want this blood work done. Get the fuck out of my room.  I want a new nurse". MD and PA made aware plan is to discharge patient home

## 2015-10-20 ENCOUNTER — Other Ambulatory Visit: Payer: Self-pay | Admitting: Podiatry

## 2015-10-21 ENCOUNTER — Encounter (HOSPITAL_COMMUNITY): Payer: Medicare Other | Attending: Cardiology

## 2015-10-21 ENCOUNTER — Encounter (HOSPITAL_COMMUNITY): Admission: RE | Admit: 2015-10-21 | Payer: Medicare Other | Source: Ambulatory Visit

## 2015-10-21 ENCOUNTER — Encounter (HOSPITAL_COMMUNITY): Payer: Medicare Other

## 2015-10-25 DIAGNOSIS — E669 Obesity, unspecified: Secondary | ICD-10-CM | POA: Diagnosis not present

## 2015-10-25 DIAGNOSIS — E785 Hyperlipidemia, unspecified: Secondary | ICD-10-CM | POA: Diagnosis not present

## 2015-10-25 DIAGNOSIS — I10 Essential (primary) hypertension: Secondary | ICD-10-CM | POA: Diagnosis not present

## 2015-10-25 DIAGNOSIS — F17290 Nicotine dependence, other tobacco product, uncomplicated: Secondary | ICD-10-CM | POA: Diagnosis not present

## 2015-10-31 ENCOUNTER — Encounter: Payer: Self-pay | Admitting: *Deleted

## 2015-10-31 ENCOUNTER — Ambulatory Visit: Payer: Medicare Other | Admitting: Cardiovascular Disease

## 2015-12-25 ENCOUNTER — Ambulatory Visit (INDEPENDENT_AMBULATORY_CARE_PROVIDER_SITE_OTHER): Payer: Medicare Other | Admitting: Certified Nurse Midwife

## 2015-12-25 ENCOUNTER — Encounter: Payer: Self-pay | Admitting: Certified Nurse Midwife

## 2015-12-25 VITALS — BP 117/67 | HR 69 | Ht 64.0 in | Wt 283.0 lb

## 2015-12-25 DIAGNOSIS — N939 Abnormal uterine and vaginal bleeding, unspecified: Secondary | ICD-10-CM | POA: Diagnosis not present

## 2015-12-25 DIAGNOSIS — N951 Menopausal and female climacteric states: Secondary | ICD-10-CM | POA: Insufficient documentation

## 2015-12-25 DIAGNOSIS — R102 Pelvic and perineal pain: Secondary | ICD-10-CM | POA: Diagnosis not present

## 2015-12-25 DIAGNOSIS — N926 Irregular menstruation, unspecified: Secondary | ICD-10-CM

## 2015-12-25 DIAGNOSIS — N946 Dysmenorrhea, unspecified: Secondary | ICD-10-CM

## 2015-12-25 DIAGNOSIS — N3946 Mixed incontinence: Secondary | ICD-10-CM

## 2015-12-25 MED ORDER — TRAMADOL HCL 50 MG PO TABS: 50.0000 mg | ORAL_TABLET | Freq: Four times a day (QID) | ORAL | 5 refills | Status: DC | PRN

## 2015-12-25 MED ORDER — TRAMADOL HCL 50 MG PO TABS: 50.0000 mg | ORAL_TABLET | Freq: Four times a day (QID) | ORAL | 0 refills | Status: DC | PRN

## 2015-12-25 NOTE — Progress Notes (Signed)
Patient ID: Sarah Mathews, female   DOB: 1966-01-25, 50 y.o.   MRN: [id number]  Chief Complaint  Patient presents with  . Gynecologic Exam    lower pelvic pain and pressure, pt also has pain in legs.  pt states cycles are irregular-short- but has lots of cramping.    HPI Sarah Mathews is a 50 y.o. female.  No period for November, had one in October and not in September and nothing in August.  Up to date on pap smear, hx of irregular pap smears, gets pap smears every 6 months.  G10P7,  Periods are lasting 1-2 days. Started around 46 years decreasing in duration.  Reports dime to quarter sized clots with last periods.  Reports severe dysmenorrhea, reports cramping every day for a year.  Has tried NSAIDs for the pain.  Hx of ectopic pregnancy.  Reports HA with cramping.  Reports having multiple c-sections.  Does have a hx of hypertension.  Reports recent colonoscopy.   Reports going to the bathroom 6 times per night, with stress and urge incontinence.  Denies every having a tubal ligation, states that she had a salpingectomy d/t ectopic pregnancy with her last pregnancy:#10.  States that tylenol does not help with her pain.  Desires to exercise.  States that when she walks her legs go numb and the cramping is increased.  Reports occasional constipation.  Does have GERD with full feeling after eating.  States that she only eats about 1 time per day and has not lost weight, has cut out sugars from her die.    HPI  Past Medical History:  Diagnosis Date  . Depressed   . GERD (gastroesophageal reflux disease)   . Hypertension   . Obesity     Past Surgical History:  Procedure Laterality Date  . DILATION AND CURETTAGE OF UTERUS  09/2006  . TUBAL LIGATION      Family History  Problem Relation Age of Onset  . Heart failure Mother     Social History Social History  Substance Use Topics  . Smoking status: Never Smoker  . Smokeless tobacco: Never Used  . Alcohol use 0.0 oz/week     Comment:  occasional-wine    Allergies  Allergen Reactions  . Darvocet [Propoxyphene N-Acetaminophen] Other (See Comments)    In high amount heart palpitations  . Darvon [Propoxyphene Hcl]   . Percocet [Oxycodone-Acetaminophen] Nausea And Vomiting  . Stadol [Butorphanol] Other (See Comments)    delierious    Current Outpatient Prescriptions  Medication Sig Dispense Refill  . albuterol (PROVENTIL HFA;VENTOLIN HFA) 108 (90 BASE) MCG/ACT inhaler Inhale 2 puffs into the lungs every 6 (six) hours as needed for wheezing or shortness of breath. 1 Inhaler 0  . ALPRAZolam (XANAX) 0.25 MG tablet Take 0.25 mg by mouth at bedtime as needed for anxiety.    [name] aspirin EC 81 MG tablet Take 81 mg by mouth daily.    . metoprolol tartrate (LOPRESSOR) 25 MG tablet Take 25 mg by mouth 2 (two) times daily.    . cyclobenzaprine (FLEXERIL) 10 MG tablet Take 1 tablet (10 mg total) by mouth 2 (two) times daily as needed for muscle spasms. (Patient not taking: Reported on 12/25/2015) 20 tablet 0  . HYDROcodone-acetaminophen (NORCO) 10-325 MG tablet Take 1 tablet by mouth 2 (two) times daily as needed. (Patient not taking: Reported on 12/25/2015) 60 tablet 0  . nabumetone (RELAFEN) 500 MG tablet TAKE 1 TABLET (500 MG TOTAL) BY MOUTH 2 (TWO) TIMES DAILY. (Patient not  taking: Reported on 12/25/2015) 60 tablet 1  . Pseudoephedrine-Ibuprofen (ADVIL COLD & SINUS LIQUI-GELS) 30-200 MG CAPS Take 2 capsules by mouth every 6 (six) hours as needed. For pain/cold/allergy symptoms    . traMADol (ULTRAM) 50 MG tablet Take 1 tablet (50 mg total) by mouth every 6 (six) hours as needed. 45 tablet 5   No current facility-administered medications for this visit.     Review of Systems Review of Systems Constitutional: negative for fatigue and weight loss Respiratory: negative for cough and wheezing Cardiovascular: negative for chest pain, fatigue and palpitations Gastrointestinal: negative for abdominal pain and change in bowel  habits Genitourinary:+AUB, dysmenorrhea Integument/breast: negative for nipple discharge Musculoskeletal:negative for myalgias Neurological: negative for gait problems and tremors Behavioral/Psych: negative for abusive relationship, depression Endocrine: negative for temperature intolerance      Blood pressure 117/67, pulse 69, height 5\' 4"  (1.626 m), weight 283 lb (128.4 kg), last menstrual period 11/24/2015.  Physical Exam Physical Exam General:   alert  Skin:   no rash or abnormalities  Lungs:   clear to auscultation bilaterally  Heart:   regular rate and rhythm, S1, S2 normal, no murmur, click, rub or gallop  Breasts:   normal without suspicious masses, skin or nipple changes or axillary nodes  Abdomen:  normal findings: no organomegaly, soft, non-tender and no hernia Large panus  Pelvis:  External genitalia: normal general appearance Urinary system: urethral meatus normal and bladder without fullness, nontender Vaginal: normal without tenderness, induration or masses Cervix: normal appearance, prolapsed into vaginal canal Adnexa: normal bimanual exam Uterus: anteverted and non-tender, slightly enlarged in size    75% of 30 min visit spent on counseling and coordination of care.    Data Reviewed Previous medical hx, meds  Assessment     History of abnormal pap smears  urge/stress  incontinence  Severe dysmenorrhea  Morbid obesity Irregular periods  Perimenopausal status  Uterine prolapse  Plan    Orders Placed This Encounter  Procedures  . US Transvaginal Non-OB    Standing Status:   Future    Standing Expiration Date:   02/23/2017    Order Specific Question:   Reason for Exam (SYMPTOM  OR DIAGNOSIS REQUIRED)    Answer:   dysmenorrhea, hx of AUB, perimenopausal, morbid obesity    Order Specific Question:   Preferred imaging location?    Answer:   [facility]  . US Pelvis Complete    Standing Status:   Future    Standing Expiration Date:   02/23/2017     Order Specific Question:   Reason for Exam (SYMPTOM  OR DIAGNOSIS REQUIRED)    Answer:   dysmenorrhea, hx of AUB, perimenopausal, morbid obesity    Order Specific Question:   Preferred imaging location?    Answer:   [facility]  . Ambulatory referral to General Surgery    Referral Priority:   Routine    Referral Type:   Surgical    Referral Reason:   Specialty Services Required    Requested Specialty:   General Surgery    Number of Visits Requested:   1  . Ambulatory referral to Urology    Referral Priority:   Routine    Referral Type:   Consultation    Referral Reason:   Specialty Services Required    Requested Specialty:   Urology    Number of Visits Requested:   1  . Ambulatory referral to Urogynecology    Referral Priority:   Urgent  Referral Type:   Consultation    Referral Reason:   Specialty Services Required    Requested Specialty:   Urology    Number of Visits Requested:   1   Meds ordered this encounter  Medications  . metoprolol tartrate (LOPRESSOR) 25 MG tablet    Sig: Take 25 mg by mouth 2 (two) times daily.  [name] aspirin EC 81 MG tablet    Sig: Take 81 mg by mouth daily.  [name] ALPRAZolam (XANAX) 0.25 MG tablet    Sig: Take 0.25 mg by mouth at bedtime as needed for anxiety.  [name] DISCONTD: traMADol (ULTRAM) 50 MG tablet    Sig: Take 1 tablet (50 mg total) by mouth every 6 (six) hours as needed.    Dispense:  15 tablet    Refill:  0  . traMADol (ULTRAM) 50 MG tablet    Sig: Take 1 tablet (50 mg total) by mouth every 6 (six) hours as needed.    Dispense:  45 tablet    Refill:  5    Need to obtain previous records  Follow up as needed.

## 2015-12-28 IMAGING — CR DG LUMBAR SPINE COMPLETE 4+V
5 series · 5 of 5 positions shown · non-contrast
Comparison: None.

CLINICAL DATA: Lower left back pain radiating down the left leg for
3 weeks. No injury.

EXAM:
LUMBAR SPINE - COMPLETE 4+ VIEW

[t l-spine a.p.]
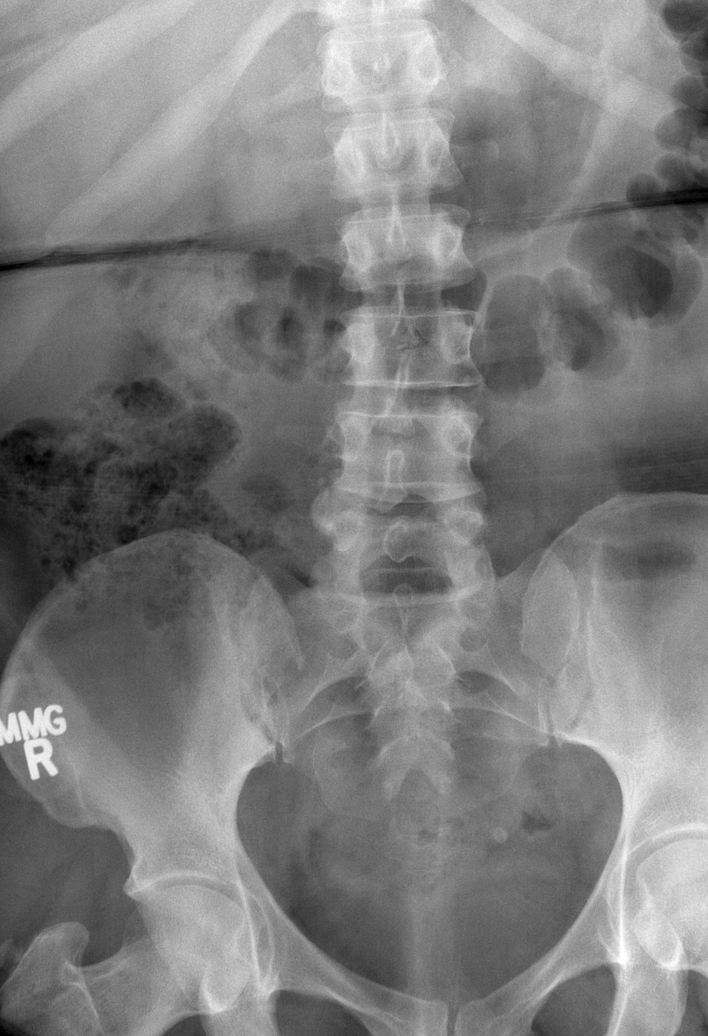

[t l-spine oblique exposure (1 of 2)]
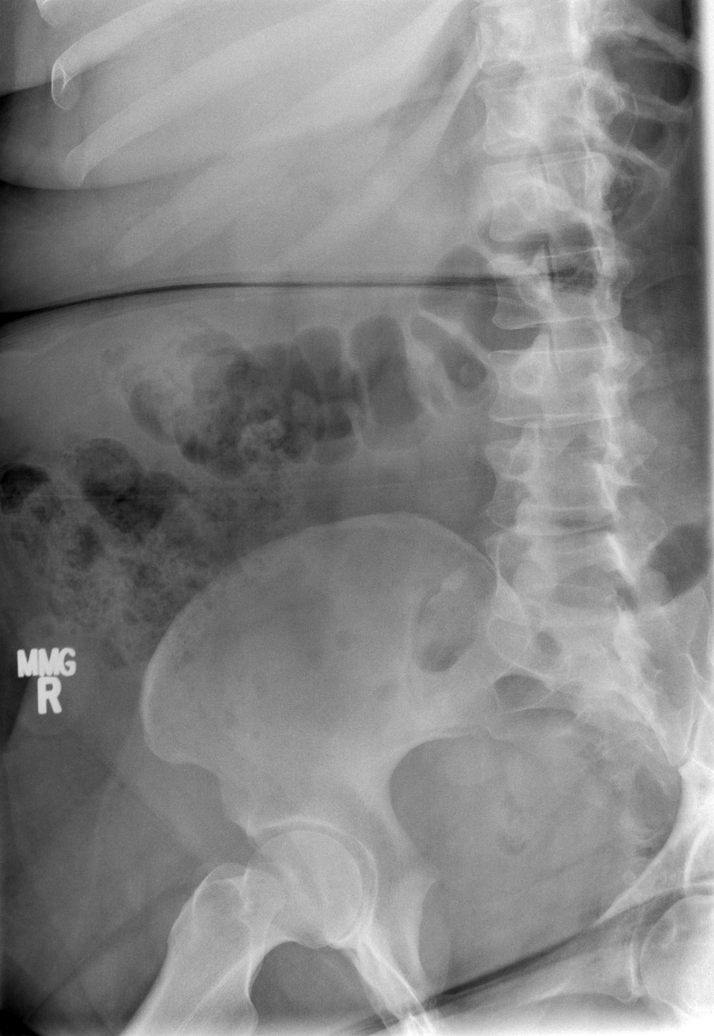

[t l-spine oblique exposure (2 of 2)]
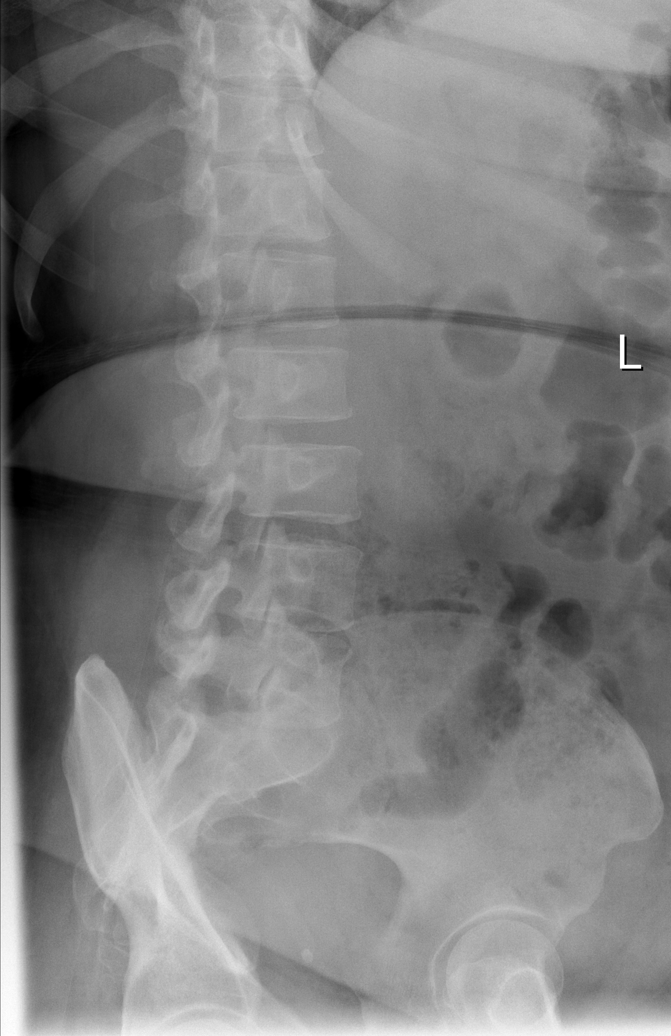

[t l-spine lat]
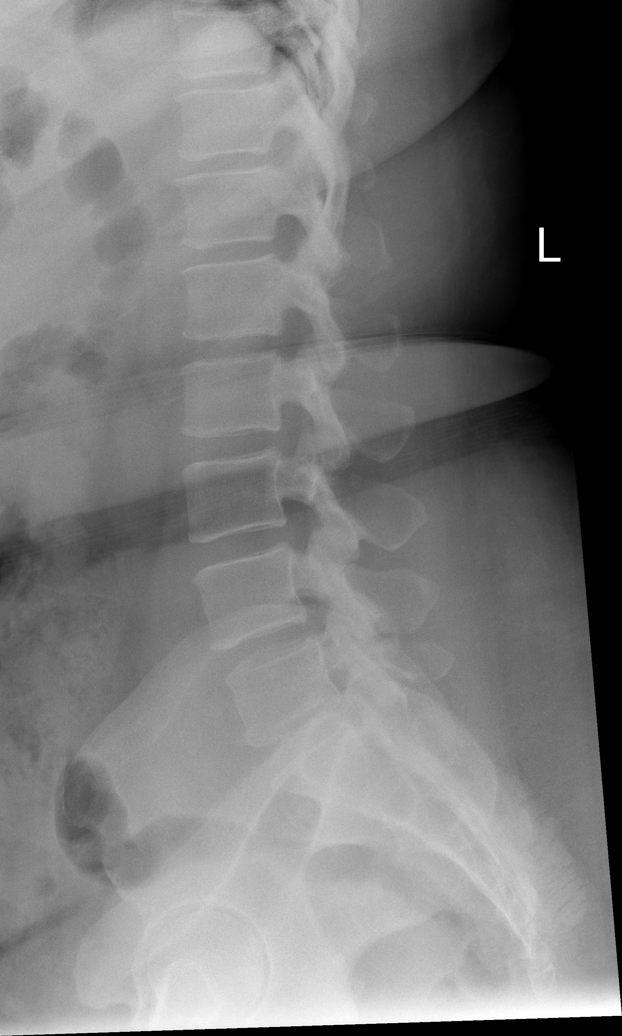

[t l-spine l5-s1 spot]
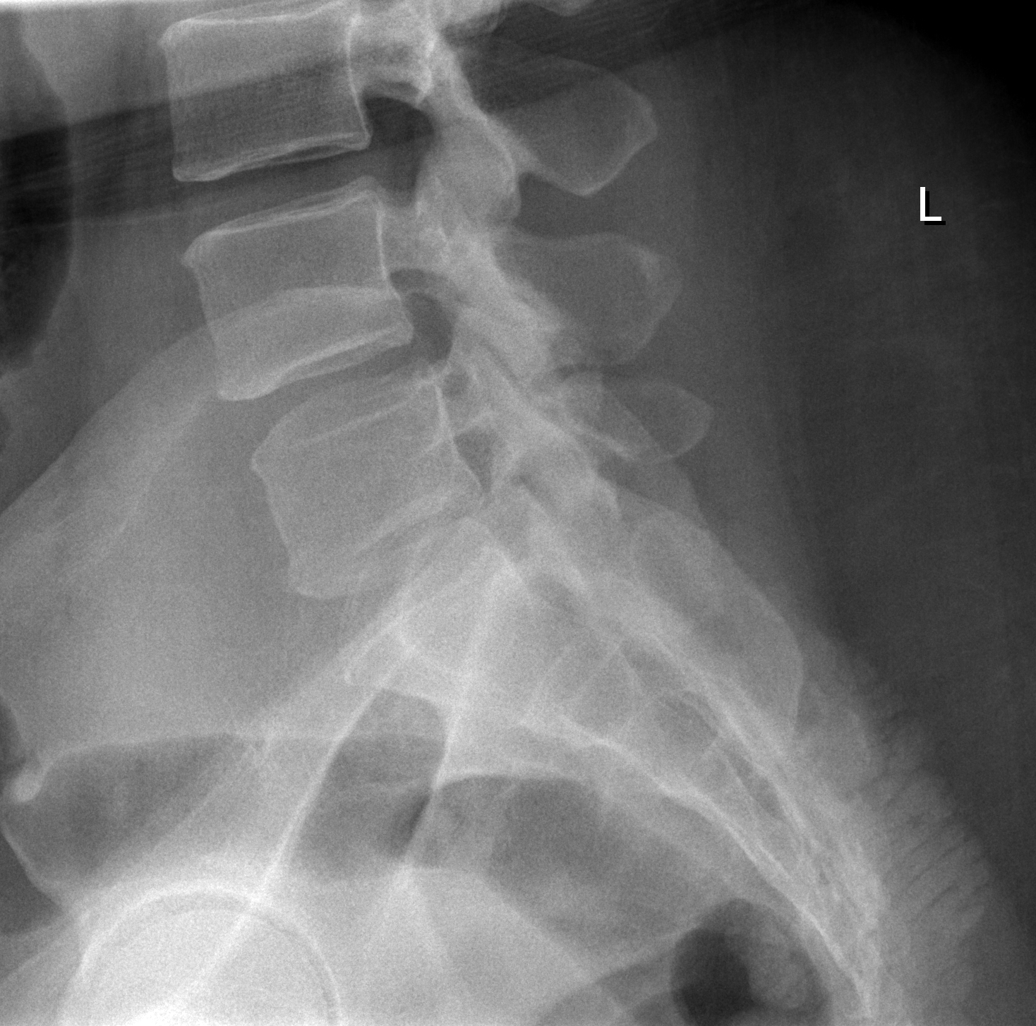

[5 of 5 positions shown; findings below may reference images not displayed]

FINDINGS: There is no evidence of lumbar spine fracture. Alignment is normal.
Intervertebral disc spaces are maintained.
IMPRESSION: Negative.

## 2016-01-06 ENCOUNTER — Ambulatory Visit (HOSPITAL_COMMUNITY): Payer: Medicare Other

## 2016-01-06 ENCOUNTER — Encounter (HOSPITAL_COMMUNITY): Payer: Self-pay | Admitting: *Deleted

## 2016-01-06 ENCOUNTER — Ambulatory Visit (HOSPITAL_COMMUNITY)
Admission: EM | Admit: 2016-01-06 | Discharge: 2016-01-06 | Disposition: A | Discharge status: Home / Self Care | Payer: Medicare Other | Attending: Family | Admitting: Family

## 2016-01-06 DIAGNOSIS — J0140 Acute pansinusitis, unspecified: Secondary | ICD-10-CM

## 2016-01-06 DIAGNOSIS — J40 Bronchitis, not specified as acute or chronic: Secondary | ICD-10-CM

## 2016-01-06 MED ORDER — AMOXICILLIN-POT CLAVULANATE 875-125 MG PO TABS: 1.0000 | ORAL_TABLET | Freq: Two times a day (BID) | ORAL | 0 refills | Status: AC

## 2016-01-06 MED ORDER — GUAIFENESIN ER 600 MG PO TB12: 600.0000 mg | ORAL_TABLET | Freq: Two times a day (BID) | ORAL | 0 refills | Status: DC | PRN

## 2016-01-06 MED ORDER — ALBUTEROL SULFATE HFA 108 (90 BASE) MCG/ACT IN AERS: 2.0000 | INHALATION_SPRAY | Freq: Four times a day (QID) | RESPIRATORY_TRACT | 0 refills | Status: DC | PRN

## 2016-01-06 MED ORDER — GUAIFENESIN-CODEINE 100-10 MG/5ML PO SYRP: 10.0000 mL | ORAL_SOLUTION | Freq: Three times a day (TID) | ORAL | 0 refills | Status: DC | PRN

## 2016-01-06 NOTE — ED Provider Notes (Signed)
CSN: [id number]     Arrival date & time 01/06/16  1001 History   First MD Initiated Contact with Patient 01/06/16 1016     Chief Complaint  Patient presents with  . URI   (Consider location/radiation/quality/duration/timing/severity/associated sxs/prior Treatment) 50 year old female presents with nasal congestion, headache, cough and chest congestion for over 1 week. Has been wheezing the past few days and unable to sleep at night due to cough. Denies any fever or GI symptoms. Has taken Albuterol inhaler with minimal relief. Has not taken other medication due to concern over interaction with Metoprolol. No history of asthma but tends to get URI/bronchitis every year in which she uses an inhaler.    The history is provided by the patient.    Past Medical History:  Diagnosis Date  . Depressed   . GERD (gastroesophageal reflux disease)   . Hypertension   . Obesity    Past Surgical History:  Procedure Laterality Date  . DILATION AND CURETTAGE OF UTERUS  09/2006  . TUBAL LIGATION     Family History  Problem Relation Age of Onset  . Heart failure Mother    Social History  Substance Use Topics  . Smoking status: Never Smoker  . Smokeless tobacco: Never Used  . Alcohol use 0.0 oz/week     Comment: occasional-wine   OB History    Gravida Para Term Preterm AB Living   10       3     SAB TAB Ectopic Multiple Live Births   2   1         Review of Systems  Constitutional: Positive for fatigue. Negative for chills and fever.  HENT: Positive for congestion, rhinorrhea, sinus pain, sinus pressure and sore throat. Negative for ear pain.   Eyes: Negative for discharge and visual disturbance.  Respiratory: Positive for cough, shortness of breath and wheezing. Negative for chest tightness.   Cardiovascular: Negative for chest pain.  Gastrointestinal: Negative for abdominal pain, diarrhea, nausea and vomiting.  Musculoskeletal: Negative for neck pain and neck stiffness.  Skin: Negative  for rash.  Neurological: Positive for light-headedness and headaches. Negative for dizziness, syncope, weakness and numbness.  Hematological: Negative for adenopathy.    Allergies  Darvocet [propoxyphene n-acetaminophen]; Darvon [propoxyphene hcl]; Percocet [oxycodone-acetaminophen]; and Stadol [butorphanol]  Home Medications   Prior to Admission medications   Medication Sig Start Date End Date Taking? Authorizing Provider  albuterol (PROVENTIL HFA;VENTOLIN HFA) 108 (90 Base) MCG/ACT inhaler Inhale 2 puffs into the lungs every 6 (six) hours as needed for wheezing or shortness of breath. 01/06/16   Katy Apo, NP  ALPRAZolam Duanne Moron) 0.25 MG tablet Take 0.25 mg by mouth at bedtime as needed for anxiety.    Historical Provider, MD  amoxicillin-clavulanate (AUGMENTIN) 875-125 MG tablet Take 1 tablet by mouth every 12 (twelve) hours. 01/06/16 01/13/16  Katy Apo, NP  aspirin EC 81 MG tablet Take 81 mg by mouth daily.    Historical Provider, MD  guaiFENesin (MUCINEX) 600 MG 12 hr tablet Take 1 tablet (600 mg total) by mouth 2 (two) times daily as needed for to loosen phlegm. 01/06/16   Katy Apo, NP  guaiFENesin-codeine (ROBITUSSIN AC) 100-10 MG/5ML syrup Take 10 mLs by mouth 3 (three) times daily as needed for cough. 01/06/16   Katy Apo, NP  metoprolol tartrate (LOPRESSOR) 25 MG tablet Take 25 mg by mouth 2 (two) times daily.    Historical Provider, MD  Pseudoephedrine-Ibuprofen (ADVIL COLD &  SINUS LIQUI-GELS) 30-200 MG CAPS Take 2 capsules by mouth every 6 (six) hours as needed. For pain/cold/allergy symptoms    Historical Provider, MD  traMADol (ULTRAM) 50 MG tablet Take 1 tablet (50 mg total) by mouth every 6 (six) hours as needed. 12/25/15   Morene Crocker, CNM   Meds Ordered and Administered this Visit  Medications - No data to display  BP 147/82 (BP Location: Left Arm)   Pulse 68   Temp 98.4 F (36.9 C) (Oral)   Resp 16   LMP 12/31/2015   SpO2 98%  No data  found.   Physical Exam  Constitutional: She is oriented to person, place, and time. She appears well-developed and well-nourished. She appears ill. No distress.  HENT:  Head: Normocephalic and atraumatic.  Right Ear: Hearing, external ear and ear canal normal. Tympanic membrane is bulging.  Left Ear: Hearing, external ear and ear canal normal. Tympanic membrane is bulging.  Nose: Mucosal edema and rhinorrhea present. Right sinus exhibits maxillary sinus tenderness and frontal sinus tenderness. Left sinus exhibits maxillary sinus tenderness and frontal sinus tenderness.  Mouth/Throat: Uvula is midline and mucous membranes are normal. Posterior oropharyngeal erythema present.  Neck: Normal range of motion. Neck supple.  Cardiovascular: Normal rate, regular rhythm and normal heart sounds.   Pulmonary/Chest: Effort normal. No respiratory distress. She has no decreased breath sounds. She has wheezes in the right upper field and the left upper field. She has rhonchi in the right upper field and the left upper field.  Lymphadenopathy:    She has no cervical adenopathy.  Neurological: She is alert and oriented to person, place, and time.  Skin: Skin is warm and dry. Capillary refill takes less than 2 seconds.  Psychiatric: She has a normal mood and affect. Her behavior is normal. Judgment and thought content normal.    Urgent Care Course   Clinical Course     Procedures (including critical care time)  Labs Review Labs Reviewed - No data to display  Imaging Review No results found.   Visual Acuity Review  Right Eye Distance:   Left Eye Distance:   Bilateral Distance:    Right Eye Near:   Left Eye Near:    Bilateral Near:         MDM   1. Acute non-recurrent pansinusitis   2. Bronchitis    Recommend start Augmentin 875mg  twice a day as directed. Use Mucinex 600mg  twice a day to help with loosen mucus. May use Albuterol inhaler 2 puffs every 6 hours as needed for cough.  Offered Tessalon cough pills but she states "they are not helpful". May try Robitussin AC 1 teaspoon every 8 hours as needed for cough- will cause drowsiness. Increase fluid intake to help loosen mucus. Recommend no decongestants or Sudafed due to elevated BP. Follow-up in 3 days with your primary care provider if not improving.    Katy Apo, NP 01/07/16 1136

## 2016-01-06 NOTE — ED Triage Notes (Signed)
Cough   Congested   Sneezing   Pain in  Chest  When  She  Coughs         pt    Sitting  Upright  On  Exam table  Speaking in  Complete   sentances    Pt  Is  Masked  And  Is  In a  privare  Room

## 2016-01-06 NOTE — ED Notes (Signed)
PT made aware that she cannot drive or operate machinery while using prescribed cough syrup.

## 2016-01-06 NOTE — Discharge Instructions (Signed)
Recommend start Augmentin (antibiotic)  twice a day as directed. Use Mucinex twice a day to help with mucus. Use Albuterol inhaler 2 puffs every 6 hours as needed for cough. May take Robitussin AC every 8 hours as needed for cough- will cause drowsiness. Increase fluid intake to help loosen mucus. Follow-up in 3 days with your primary care provider if not improving.

## 2016-01-10 ENCOUNTER — Ambulatory Visit (HOSPITAL_COMMUNITY)
Admission: RE | Admit: 2016-01-10 | Discharge: 2016-01-10 | Disposition: A | Discharge status: Home / Self Care | Payer: Medicare Other | Source: Ambulatory Visit | Attending: Certified Nurse Midwife | Admitting: Certified Nurse Midwife

## 2016-01-10 DIAGNOSIS — D259 Leiomyoma of uterus, unspecified: Secondary | ICD-10-CM | POA: Diagnosis not present

## 2016-01-10 DIAGNOSIS — N939 Abnormal uterine and vaginal bleeding, unspecified: Secondary | ICD-10-CM

## 2016-01-10 DIAGNOSIS — N946 Dysmenorrhea, unspecified: Secondary | ICD-10-CM | POA: Diagnosis not present

## 2016-01-10 DIAGNOSIS — N926 Irregular menstruation, unspecified: Secondary | ICD-10-CM | POA: Diagnosis not present

## 2016-01-10 DIAGNOSIS — N951 Menopausal and female climacteric states: Secondary | ICD-10-CM

## 2016-01-28 DIAGNOSIS — J019 Acute sinusitis, unspecified: Secondary | ICD-10-CM | POA: Diagnosis not present

## 2016-02-06 ENCOUNTER — Ambulatory Visit (INDEPENDENT_AMBULATORY_CARE_PROVIDER_SITE_OTHER): Payer: Medicare Other | Admitting: Obstetrics & Gynecology

## 2016-02-06 ENCOUNTER — Encounter: Payer: Self-pay | Admitting: Obstetrics & Gynecology

## 2016-02-06 VITALS — BP 134/82 | HR 83 | Ht 64.0 in | Wt 283.0 lb

## 2016-02-06 DIAGNOSIS — D259 Leiomyoma of uterus, unspecified: Secondary | ICD-10-CM | POA: Diagnosis not present

## 2016-02-06 DIAGNOSIS — N951 Menopausal and female climacteric states: Secondary | ICD-10-CM

## 2016-02-06 DIAGNOSIS — N946 Dysmenorrhea, unspecified: Secondary | ICD-10-CM | POA: Diagnosis not present

## 2016-02-06 DIAGNOSIS — N393 Stress incontinence (female) (male): Secondary | ICD-10-CM | POA: Diagnosis not present

## 2016-02-06 NOTE — Progress Notes (Signed)
Pt presents for f/u lower abdominal pain. U/S completed 01/10/16 shows fibroids: Measurements: 10.4 x 5.2 x 6.2 cm. A left posterior wall fibroid measures 1.8 x 1.5 x 1.8 cm. A fibroid within the right anterior wall of the uterus measures 1.8 x 1.7 x 1.3 cm.

## 2016-02-06 NOTE — Progress Notes (Signed)
Patient ID: Sarah Mathews, female   DOB: 03-23-1965, 50 y.o.   MRN: [id number]  Chief Complaint  Patient presents with  . Follow-up    F/u pain and u/s results    HPI Sarah Mathews is a 50 y.o. female.  [id number] Patient's last menstrual period was 12/19/2015. Irregular menses, dysmenorrhea, frequent pelvic and back pain. She has an appointment with Dr Zigmund Daniel at [facility] next week HPI  Past Medical History:  Diagnosis Date  . Depressed   . Fibroids   . GERD (gastroesophageal reflux disease)   . Hypertension   . Obesity     Past Surgical History:  Procedure Laterality Date  . DILATION AND CURETTAGE OF UTERUS  09/2006  . TUBAL LIGATION      Family History  Problem Relation Age of Onset  . Heart failure Mother     Social History Social History  Substance Use Topics  . Smoking status: Never Smoker  . Smokeless tobacco: Never Used  . Alcohol use 0.0 oz/week     Comment: occasional-wine    Allergies  Allergen Reactions  . Darvocet [Propoxyphene N-Acetaminophen] Other (See Comments)    In high amount heart palpitations  . Darvon [Propoxyphene Hcl]   . Percocet [Oxycodone-Acetaminophen] Nausea And Vomiting  . Stadol [Butorphanol] Other (See Comments)    delierious    Current Outpatient Prescriptions  Medication Sig Dispense Refill  . ALPRAZolam (XANAX) 0.25 MG tablet Take 0.25 mg by mouth at bedtime as needed for anxiety.    [name] aspirin EC 81 MG tablet Take 81 mg by mouth daily.    [name] doxycycline (VIBRAMYCIN) 100 MG capsule     . guaiFENesin (MUCINEX) 600 MG 12 hr tablet Take 1 tablet (600 mg total) by mouth 2 (two) times daily as needed for to loosen phlegm. 20 tablet 0  . metoprolol tartrate (LOPRESSOR) 25 MG tablet Take 25 mg by mouth 2 (two) times daily.    [name] albuterol (PROVENTIL HFA;VENTOLIN HFA) 108 (90 Base) MCG/ACT inhaler Inhale 2 puffs into the lungs every 6 (six) hours as needed for wheezing or shortness of breath. (Patient not taking: Reported on 02/06/2016) 1  Inhaler 0  . guaiFENesin-codeine (ROBITUSSIN AC) 100-10 MG/5ML syrup Take 10 mLs by mouth 3 (three) times daily as needed for cough. (Patient not taking: Reported on 02/06/2016) 120 mL 0  . Pseudoephedrine-Ibuprofen (ADVIL COLD & SINUS LIQUI-GELS) 30-200 MG CAPS Take 2 capsules by mouth every 6 (six) hours as needed. For pain/cold/allergy symptoms    . traMADol (ULTRAM) 50 MG tablet Take 1 tablet (50 mg total) by mouth every 6 (six) hours as needed. (Patient not taking: Reported on 02/06/2016) 45 tablet 5   No current facility-administered medications for this visit.     Review of Systems Review of Systems  Constitutional: Negative.   Gastrointestinal: Positive for abdominal pain.  Genitourinary: Positive for frequency and pelvic pain. Negative for vaginal bleeding and vaginal discharge.    Blood pressure 134/82, pulse 83, height 5\' 4"  (1.626 m), weight 283 lb (128.4 kg), last menstrual period 12/19/2015.  Physical Exam Physical Exam  Constitutional: She appears well-developed.  obese  Cardiovascular: Normal rate.   Pulmonary/Chest: Effort normal.  Psychiatric: She has a normal mood and affect. Her behavior is normal.    Data Reviewed [place] report reviewed  Assessment    Patient Active Problem List   Diagnosis Date Noted  . SUI (stress urinary incontinence, female) 02/06/2016  . Dysmenorrhea 12/25/2015  . Abnormal uterine bleeding (AUB) 12/25/2015  .  Perimenopausal 12/25/2015  . Metatarsal deformity 11/09/2014  . Pain, foot 02/07/2013  . Equinus deformity of foot, acquired 02/07/2013  . Tenosynovitis of foot and ankle 02/07/2013  morbid obesity Fibroid uterus     Plan    F/U with Dr Gilda Crease and her cardiologist as scheduled       Emeterio Reeve 02/06/2016, 3:46 PM

## 2016-02-07 DIAGNOSIS — F419 Anxiety disorder, unspecified: Secondary | ICD-10-CM | POA: Diagnosis not present

## 2016-02-07 DIAGNOSIS — E785 Hyperlipidemia, unspecified: Secondary | ICD-10-CM | POA: Diagnosis not present

## 2016-02-07 DIAGNOSIS — F17290 Nicotine dependence, other tobacco product, uncomplicated: Secondary | ICD-10-CM | POA: Diagnosis not present

## 2016-02-07 DIAGNOSIS — R0789 Other chest pain: Secondary | ICD-10-CM | POA: Diagnosis not present

## 2016-02-07 DIAGNOSIS — I10 Essential (primary) hypertension: Secondary | ICD-10-CM | POA: Diagnosis not present

## 2016-02-12 DIAGNOSIS — R32 Unspecified urinary incontinence: Secondary | ICD-10-CM | POA: Diagnosis not present

## 2016-02-12 DIAGNOSIS — Z6841 Body Mass Index (BMI) 40.0 and over, adult: Secondary | ICD-10-CM | POA: Diagnosis not present

## 2016-02-12 DIAGNOSIS — E669 Obesity, unspecified: Secondary | ICD-10-CM | POA: Diagnosis not present

## 2016-02-12 DIAGNOSIS — R102 Pelvic and perineal pain: Secondary | ICD-10-CM | POA: Diagnosis not present

## 2016-02-12 DIAGNOSIS — R8290 Unspecified abnormal findings in urine: Secondary | ICD-10-CM | POA: Diagnosis not present

## 2016-03-10 ENCOUNTER — Emergency Department (HOSPITAL_COMMUNITY)
Admission: EM | Admit: 2016-03-10 | Discharge: 2016-03-10 | Disposition: A | Discharge status: Home / Self Care | Payer: Medicare Other | Attending: Emergency Medicine | Admitting: Emergency Medicine

## 2016-03-10 ENCOUNTER — Emergency Department (HOSPITAL_COMMUNITY): Payer: Medicare Other

## 2016-03-10 ENCOUNTER — Encounter (HOSPITAL_COMMUNITY): Payer: Self-pay | Admitting: Emergency Medicine

## 2016-03-10 ENCOUNTER — Other Ambulatory Visit: Payer: Self-pay

## 2016-03-10 DIAGNOSIS — R0789 Other chest pain: Secondary | ICD-10-CM

## 2016-03-10 DIAGNOSIS — Z79899 Other long term (current) drug therapy: Secondary | ICD-10-CM | POA: Diagnosis not present

## 2016-03-10 DIAGNOSIS — I10 Essential (primary) hypertension: Secondary | ICD-10-CM | POA: Insufficient documentation

## 2016-03-10 DIAGNOSIS — R059 Cough, unspecified: Secondary | ICD-10-CM

## 2016-03-10 DIAGNOSIS — R05 Cough: Secondary | ICD-10-CM | POA: Insufficient documentation

## 2016-03-10 DIAGNOSIS — Z7982 Long term (current) use of aspirin: Secondary | ICD-10-CM | POA: Diagnosis not present

## 2016-03-10 DIAGNOSIS — R079 Chest pain, unspecified: Secondary | ICD-10-CM | POA: Diagnosis not present

## 2016-03-10 LAB — CBC
HEMATOCRIT: 32.9 % — AB (ref 36.0–46.0)
Hemoglobin: 10.2 g/dL — ABNORMAL LOW (ref 12.0–15.0)
MCH: 25.9 pg — AB (ref 26.0–34.0)
MCHC: 31 g/dL (ref 30.0–36.0)
MCV: 83.5 fL (ref 78.0–100.0)
PLATELETS: 274 10*3/uL (ref 150–400)
RBC: 3.94 MIL/uL (ref 3.87–5.11)
RDW: 14.6 % (ref 11.5–15.5)
WBC: 6.5 10*3/uL (ref 4.0–10.5)

## 2016-03-10 LAB — BASIC METABOLIC PANEL
Anion gap: 8 (ref 5–15)
BUN: 9 mg/dL (ref 6–20)
CHLORIDE: 104 mmol/L (ref 101–111)
CO2: 24 mmol/L (ref 22–32)
CREATININE: 0.76 mg/dL (ref 0.44–1.00)
Calcium: 8.8 mg/dL — ABNORMAL LOW (ref 8.9–10.3)
GFR calc non Af Amer: >60 mL/min (ref >60)
Glucose, Bld: 103 mg/dL — ABNORMAL HIGH (ref 65–99)
POTASSIUM: 3.9 mmol/L (ref 3.5–5.1)
Sodium: 136 mmol/L (ref 135–145)

## 2016-03-10 LAB — I-STAT TROPONIN, ED: Troponin i, poc: 0 ng/mL (ref 0.00–0.08)

## 2016-03-10 MED ORDER — LIDOCAINE 5 % EX PTCH: 1.0000 | MEDICATED_PATCH | CUTANEOUS | 0 refills | Status: DC

## 2016-03-10 NOTE — ED Triage Notes (Signed)
Pt states she's had bronchitis at thanksgiving and ever since then she's had a bad cough and chest pain. Pt sees a heart doctor. EKG being done in triage.

## 2016-03-10 NOTE — ED Provider Notes (Signed)
[facility] DEPT Provider Note   CSN: [id number] Arrival date & time: 03/10/16  1146  By signing my name below, I, Evelene Croon, attest that this documentation has been prepared under the direction and in the presence of Pattricia Boss, MD . Electronically Signed: Evelene Croon, Scribe. 03/10/2016. 1:50 PM.   History   Chief Complaint Chief Complaint  Patient presents with  . Chest Pain  . Cough     The history is provided by the patient. No language interpreter was used.     HPI Comments:  Sarah Mathews is a 51 y.o. female who presents to the Emergency Department complaining of a persistent cough that is occasionally productive of green phlegm. Pt states she was diagnosed with Bronchitis in November 2017 but the cough has never fully resolved since then; states she has periods of time when the cough improves. Her cough is worse when supine and at night.  She has been evaluated multiple times for this cough and has been placed on multiple antibiotics including doxycycline and clindamycin which have not provided relief; denies PNA dx. She reports associated chest tightness and left sided chest/left shoulder soreness which began last night. She is not a smoker. She denies fever, leg swelling, LE redness, nausea, and vomiting. No h/o PE/DVT.   Pt notes she takes ASA, mucinex, Nasacort, albuterol, and xanax.   Past Medical History:  Diagnosis Date  . Depressed   . Fibroids   . GERD (gastroesophageal reflux disease)   . Hypertension   . Obesity     Patient Active Problem List   Diagnosis Date Noted  . SUI (stress urinary incontinence, female) 02/06/2016  . Dysmenorrhea 12/25/2015  . Abnormal uterine bleeding (AUB) 12/25/2015  . Perimenopausal 12/25/2015  . Metatarsal deformity 11/09/2014  . Pain, foot 02/07/2013  . Equinus deformity of foot, acquired 02/07/2013  . Tenosynovitis of foot and ankle 02/07/2013    Past Surgical History:  Procedure Laterality Date  . DILATION  AND CURETTAGE OF UTERUS  09/2006  . TUBAL LIGATION      OB History    Gravida Para Term Preterm AB Living   17 7 6 1 3 7    SAB TAB Ectopic Multiple Live Births   2   1 0 7       Home Medications    Prior to Admission medications   Medication Sig Start Date End Date Taking? Authorizing Provider  albuterol (PROVENTIL HFA;VENTOLIN HFA) 108 (90 Base) MCG/ACT inhaler Inhale 2 puffs into the lungs every 6 (six) hours as needed for wheezing or shortness of breath. Patient not taking: Reported on 02/06/2016 01/06/16   Katy Apo, NP  ALPRAZolam Duanne Moron) 0.25 MG tablet Take 0.25 mg by mouth at bedtime as needed for anxiety.    Historical Provider, MD  aspirin EC 81 MG tablet Take 81 mg by mouth daily.    Historical Provider, MD  doxycycline (VIBRAMYCIN) 100 MG capsule  01/28/16   Historical Provider, MD  guaiFENesin (MUCINEX) 600 MG 12 hr tablet Take 1 tablet (600 mg total) by mouth 2 (two) times daily as needed for to loosen phlegm. 01/06/16   Katy Apo, NP  guaiFENesin-codeine (ROBITUSSIN AC) 100-10 MG/5ML syrup Take 10 mLs by mouth 3 (three) times daily as needed for cough. Patient not taking: Reported on 02/06/2016 01/06/16   Katy Apo, NP  metoprolol tartrate (LOPRESSOR) 25 MG tablet Take 25 mg by mouth 2 (two) times daily.    Historical Provider, MD  Pseudoephedrine-Ibuprofen (ADVIL COLD &  SINUS LIQUI-GELS) 30-200 MG CAPS Take 2 capsules by mouth every 6 (six) hours as needed. For pain/cold/allergy symptoms    Historical Provider, MD  traMADol (ULTRAM) 50 MG tablet Take 1 tablet (50 mg total) by mouth every 6 (six) hours as needed. Patient not taking: Reported on 02/06/2016 12/25/15   Morene Crocker, CNM    Family History Family History  Problem Relation Age of Onset  . Heart failure Mother     Social History Social History  Substance Use Topics  . Smoking status: Never Smoker  . Smokeless tobacco: Never Used  . Alcohol use 0.0 oz/week     Comment:  occasional-wine     Allergies   Darvocet [propoxyphene n-acetaminophen]; Darvon [propoxyphene hcl]; Percocet [oxycodone-acetaminophen]; and Stadol [butorphanol]   Review of Systems Review of Systems  Constitutional: Negative for fever.  Respiratory: Positive for cough and chest tightness.   Cardiovascular: Positive for chest pain. Negative for leg swelling.  Gastrointestinal: Negative for nausea and vomiting.  Musculoskeletal: Positive for myalgias.  Skin: Negative for color change.  All other systems reviewed and are negative.    Physical Exam Updated Vital Signs BP 143/69 (BP Location: Right Arm)   Pulse 76   Temp 98 F (36.7 C) (Oral)   Resp 18   LMP 03/09/2016   SpO2 100%   Physical Exam  Constitutional: She is oriented to person, place, and time. She appears well-developed and well-nourished. No distress.  Morbidly obese  HENT:  Head: Normocephalic and atraumatic.  Eyes: Conjunctivae are normal.  Cardiovascular: Normal rate, regular rhythm, normal heart sounds and intact distal pulses.   No murmur heard. Pulmonary/Chest: Effort normal and breath sounds normal. No respiratory distress. She has no wheezes. She has no rales. She exhibits tenderness.    Abdominal: Soft. Bowel sounds are normal. She exhibits no distension.  Neurological: She is alert and oriented to person, place, and time.  Skin: Skin is warm and dry.  Psychiatric: She has a normal mood and affect. Her behavior is normal. Judgment and thought content normal.  Nursing note and vitals reviewed.    ED Treatments / Results  DIAGNOSTIC STUDIES:  Oxygen Saturation is 100% on RA, normal by my interpretation.    COORDINATION OF CARE:  1:41 PM Discussed treatment plan with pt at bedside and pt agreed to plan.  Labs (all labs ordered are listed, but only abnormal results are displayed) Labs Reviewed  BASIC METABOLIC PANEL - Abnormal; Notable for the following:       Result Value   Glucose, Bld  103 (*)    Calcium 8.8 (*)    All other components within normal limits  CBC - Abnormal; Notable for the following:    Hemoglobin 10.2 (*)    HCT 32.9 (*)    MCH 25.9 (*)    All other components within normal limits  I-STAT TROPOININ, ED    EKG  EKG Interpretation  Date/Time:  Tuesday March 10 2016 12:02:00 EST Ventricular Rate:  73 PR Interval:  180 QRS Duration: 80 QT Interval:  408 QTC Calculation: 449 R Axis:   2 Text Interpretation:  Normal sinus rhythm Nonspecific T wave abnormality Abnormal ECG No significant change since last tracing Confirmed by Ailea Rhatigan MD, Andee Poles [phone]) on 03/10/2016 12:09:52 PM       Radiology Dg Chest 2 View  Result Date: 03/10/2016 CLINICAL DATA:  Mid chest pain.  Bronchitis EXAM: CHEST  2 VIEW COMPARISON:  12/13/2013 FINDINGS: Normal heart size and aortic contours. There  is no edema, consolidation, effusion, or pneumothorax. No osseous findings. IMPRESSION: Negative chest. Electronically Signed   By: Monte Fantasia M.D.   On: 03/10/2016 12:44    Procedures Procedures (including critical care time)  Medications Ordered in ED Medications - No data to display   Initial Impression / Assessment and Plan / ED Course  I have reviewed the triage vital signs and the nursing notes.  Pertinent labs & imaging results that were available during my care of the patient were reviewed by me and considered in my medical decision making (see chart for details).   51 year old female presents today complaining of left-sided chest and left upper back pain that began last night. She has had bronchitis and cough for 2 months.Denies any change in fever or productivity of cough. Chest x-Chavonne Sforza here is clear. EKG without any acutely ischemic changes and normal troponin. Her chest is tender on palpation. Patient has perked negative and is not dyspneic. I feel that this is chest wall pain likely secondary to her coughing. We have discussed return precautions and need for  follow-up and she was understanding   Final Clinical Impressions(s) / ED Diagnoses   Final diagnoses:  Cough  Chest wall pain    New Prescriptions New Prescriptions   No medications on file   I personally performed the services described in this documentation, which was scribed in my presence. The recorded information has been reviewed and considered.                 Pattricia Boss, MD 03/10/16 [phone]

## 2016-03-10 NOTE — Discharge Planning (Signed)
Pt up for discharge. EDCM reviewed chart for possible CM needs.  No needs identified or communicated.  

## 2016-04-08 DIAGNOSIS — M545 Low back pain: Secondary | ICD-10-CM | POA: Diagnosis not present

## 2016-04-16 DIAGNOSIS — E785 Hyperlipidemia, unspecified: Secondary | ICD-10-CM | POA: Diagnosis not present

## 2016-04-16 DIAGNOSIS — R0609 Other forms of dyspnea: Secondary | ICD-10-CM | POA: Diagnosis not present

## 2016-04-16 DIAGNOSIS — I10 Essential (primary) hypertension: Secondary | ICD-10-CM | POA: Diagnosis not present

## 2016-04-16 DIAGNOSIS — F17290 Nicotine dependence, other tobacco product, uncomplicated: Secondary | ICD-10-CM | POA: Diagnosis not present

## 2016-05-13 ENCOUNTER — Encounter (HOSPITAL_COMMUNITY): Payer: Self-pay | Admitting: Internal Medicine

## 2016-05-13 ENCOUNTER — Ambulatory Visit (HOSPITAL_COMMUNITY)
Admission: RE | Admit: 2016-05-13 | Discharge: 2016-05-13 | Disposition: A | Discharge status: Home / Self Care | Payer: Medicare Other | Source: Ambulatory Visit | Attending: Internal Medicine | Admitting: Internal Medicine

## 2016-05-13 VITALS — BP 124/74 | HR 70 | Wt 289.8 lb

## 2016-05-13 DIAGNOSIS — R0609 Other forms of dyspnea: Secondary | ICD-10-CM | POA: Insufficient documentation

## 2016-05-13 DIAGNOSIS — I10 Essential (primary) hypertension: Secondary | ICD-10-CM | POA: Diagnosis not present

## 2016-05-13 DIAGNOSIS — R002 Palpitations: Secondary | ICD-10-CM

## 2016-05-13 DIAGNOSIS — Z87891 Personal history of nicotine dependence: Secondary | ICD-10-CM | POA: Diagnosis not present

## 2016-05-13 DIAGNOSIS — Z6841 Body Mass Index (BMI) 40.0 and over, adult: Secondary | ICD-10-CM | POA: Insufficient documentation

## 2016-05-13 DIAGNOSIS — Z885 Allergy status to narcotic agent status: Secondary | ICD-10-CM | POA: Diagnosis not present

## 2016-05-13 DIAGNOSIS — Z7982 Long term (current) use of aspirin: Secondary | ICD-10-CM | POA: Diagnosis not present

## 2016-05-13 DIAGNOSIS — Z8249 Family history of ischemic heart disease and other diseases of the circulatory system: Secondary | ICD-10-CM | POA: Insufficient documentation

## 2016-05-13 DIAGNOSIS — Z888 Allergy status to other drugs, medicaments and biological substances status: Secondary | ICD-10-CM | POA: Insufficient documentation

## 2016-05-13 DIAGNOSIS — R0683 Snoring: Secondary | ICD-10-CM | POA: Insufficient documentation

## 2016-05-13 DIAGNOSIS — K219 Gastro-esophageal reflux disease without esophagitis: Secondary | ICD-10-CM | POA: Insufficient documentation

## 2016-05-13 NOTE — Progress Notes (Signed)
[facility] NEW PATIENT NOTE  Referring Physician: Self Referral  Primary Care: None  Primary Cardiologist: Dr Terrence Dupont  HPI: Sarah Mathews was a self referral today because her mom, Sarah Mathews is a patient in the clinic.   Sarah Mathews is a 51 year old with a history of morbid obesity, GERD, HTN, and former smoker (quit 18 year ago). She does not have a cardiac history.  She has been followed by Dr Terrence Dupont for SOB and palpitations. She says he has had several episodes of tachypalpitations. Last about 2-3 minutes and go away. About 6 months ago had an episode that lasted 20 minutes. Last episode was about 2 weeks. She was sitting on couch and had tachypalpitations and resolved. Had event monitor for 30 days but didn't show anything. Felt it might be related to anxiety. No CP. Gets SOB and sometimes dizzy  She was placed on lopressor and HCTZ.  Today she is concerned about weight gain and the inability to lose weight despite changing her diet. Able to walk around the store. But gets SOB with steps. Snores at night. Day time fatigue. She does not weigh at home. She does not have a scale at home. She does not exercise. Used to drink lots of soda. Now drinks a lot of sweet tea.  Review of Systems: [y] = yes, [ ]  = no   General: Weight gain [Y ]; Weight loss [ ] ; Anorexia [ ] ; Fatigue [Y ]; Fever [ ] ; Chills [ ] ; Weakness [ ]   Cardiac: Chest pain/pressure [ ] ; Resting SOB [ ] ; Exertional SOB [Y ]; Orthopnea [ ] ; Pedal Edema [ ] ; Palpitations [Y ]; Syncope [ ] ; Presyncope [ ] ; Paroxysmal nocturnal dyspnea[ ]   Pulmonary: Cough [ ] ; Wheezing[ ] ; Hemoptysis[ ] ; Sputum [ ] ; Snoring [ ]   GI: Vomiting[ ] ; Dysphagia[ ] ; Melena[ ] ; Hematochezia [ ] ; Heartburn[ ] ; Abdominal pain [ ] ; Constipation [ ] ; Diarrhea [ ] ; BRBPR [ ]   GU: Hematuria[ ] ; Dysuria [ ] ; Nocturia[ ]   Vascular: Pain in legs with walking [ ] ; Pain in feet with lying flat [ ] ; Non-healing sores [ ] ; Stroke [ ] ; TIA [ ] ; Slurred speech [ ] ;    Neuro: Headaches[ ] ; Vertigo[ ] ; Seizures[ ] ; Paresthesias[ ] ;Blurred vision [ ] ; Diplopia [ ] ; Vision changes [ ]   Ortho/Skin: Arthritis Jazmín.Cullens ]; Joint pain [Y ]; Muscle pain [Y ]; Joint swelling [ ] ; Back Pain [Y ]; Rash [ ]   Psych: Depression[ ] ; Anxiety[Y ]  Heme: Bleeding problems [ ] ; Clotting disorders [ ] ; Anemia [ ]   Endocrine: Diabetes [ ] ; Thyroid dysfunction[ ]    Past Medical History:  Diagnosis Date  . Depressed   . Fibroids   . GERD (gastroesophageal reflux disease)   . Hypertension   . Obesity     Current Outpatient Prescriptions  Medication Sig Dispense Refill  . albuterol (PROVENTIL HFA;VENTOLIN HFA) 108 (90 Base) MCG/ACT inhaler Inhale 2 puffs into the lungs every 6 (six) hours as needed for wheezing or shortness of breath. 1 Inhaler 0  . ALPRAZolam (XANAX) 0.25 MG tablet Take 0.25 mg by mouth at bedtime as needed for anxiety.    [name] aspirin EC 81 MG tablet Take 81 mg by mouth daily.    . hydrochlorothiazide (HYDRODIURIL) 25 MG tablet Take 12.5 mg by mouth daily.    [name] lidocaine (LIDODERM) 5 % Place 1 patch onto the skin daily. Remove & Discard patch within 12 hours or as directed by MD 30  patch 0  . metoprolol tartrate (LOPRESSOR) 25 MG tablet Take 25 mg by mouth daily.    [name] guaiFENesin-codeine (ROBITUSSIN AC) 100-10 MG/5ML syrup Take 10 mLs by mouth 3 (three) times daily as needed for cough. (Patient not taking: Reported on 02/06/2016) 120 mL 0  . nitroGLYCERIN (NITROSTAT) 0.3 MG SL tablet Place 0.3 mg under the tongue every 5 (five) minutes as needed for chest pain.    . Pseudoephedrine-Ibuprofen (ADVIL COLD & SINUS LIQUI-GELS) 30-200 MG CAPS Take 2 capsules by mouth every 6 (six) hours as needed. For pain/cold/allergy symptoms     No current facility-administered medications for this encounter.     Allergies  Allergen Reactions  . Darvocet [Propoxyphene N-Acetaminophen] Other (See Comments)    In high amount heart palpitations  . Darvon [Propoxyphene Hcl]   .  Percocet [Oxycodone-Acetaminophen] Nausea And Vomiting  . Stadol [Butorphanol] Other (See Comments)    delierious      Social History   Social History  . Marital status: Single    Spouse name: N/A  . Number of children: N/A  . Years of education: N/A   Occupational History  . Not on file.   Social History Main Topics  . Smoking status: Never Smoker  . Smokeless tobacco: Never Used  . Alcohol use 0.0 oz/week     Comment: occasional-wine  . Drug use: No  . Sexual activity: Yes    Birth control/ protection: Surgical   Other Topics Concern  . Not on file   Social History Narrative  . No narrative on file      Family History  Problem Relation Age of Onset  . Heart failure Mother     Vitals:   05/13/16 1147  BP: 124/74  Pulse: 70  SpO2: 98%  Weight: 289 lb 12 oz (131.4 kg)    PHYSICAL EXAM: General:  Well appearing. No respiratory difficulty HEENT: normal Neck: supple. no JVD. Carotids 2+ bilat; no bruits. No lymphadenopathy or thryomegaly appreciated. Cor: PMI nondisplaced. Regular rate & rhythm. No rubs, gallops or murmurs. Lungs: clear Abdomen: obesityusoft, nontender, nondistended. No hepatosplenomegaly. No bruits or masses. Good bowel sounds. Extremities: no cyanosis, clubbing, rash, edema Neuro: alert & oriented x 3, cranial nerves grossly intact. moves all 4 extremities w/o difficulty. Affect pleasant. Pressured speech  ECG 02/22/16: NSR 73 normal axis  No ST-T wave abnormalities.    ASSESSMENT & PLAN:  1. Exertional dyspnea  - Suspect mostly due to obesity - Will check ECHO to look at [facility] and pulmonary pressures 2. Palpitations - Possible SVT in setting of ? OSA - She has had a 30-day event monitor a few months ago. No events noted. Instructed to avoid caffeine. Discussed use of possible AliveCor 3. Day time fatigue - Likely has OSA -  set up for sleep study.  4. GERD 5. Obesity - needs to lose weight. Suggested [place]. Discussed the  fact that if she doesn't address her risk factrors she will develop cardiac issues.  Darrick Grinder, NP  Patient seen and examined with Darrick Grinder, NP. We discussed all aspects of the encounter. I agree with the assessment and plan as stated above.   Suspect tachypalpitations may be PSVT. We were unable to catch this on a 30-day event monitor. We discussed AliveCor device and she will get one for self-monitoring. Continue lopressor. Will get echo.   Agree that she likely has OSA. Will arrange sleep study.   Discussed need for weight loss. She is motivated  and going to see a nutritionist. OK to exercise and encourage her to switch to unsweetened drinks.   Glori Bickers, MD  1:03 PM

## 2016-05-13 NOTE — Patient Instructions (Signed)
Your provider recommends you have an Echocardiogram (ultrasound of your heart).  Your provider has ordered a sleep study for you.  Please follow the [place].  Walk 78min daily.  Follow up with Dr.Bensimhon in 4 months.

## 2016-05-13 NOTE — Addendum Note (Signed)
Encounter addended by: Harvie Junior, CMA on: 05/13/2016  1:11 PM<BR>    Actions taken: Order list changed, Diagnosis association updated, Sign clinical note

## 2016-05-18 ENCOUNTER — Ambulatory Visit: Payer: Medicare Other | Admitting: Registered"

## 2016-05-26 ENCOUNTER — Ambulatory Visit (HOSPITAL_COMMUNITY)
Admission: RE | Admit: 2016-05-26 | Discharge: 2016-05-26 | Disposition: A | Discharge status: Home / Self Care | Payer: Medicare Other | Source: Ambulatory Visit | Attending: Internal Medicine | Admitting: Internal Medicine

## 2016-05-26 DIAGNOSIS — I361 Nonrheumatic tricuspid (valve) insufficiency: Secondary | ICD-10-CM | POA: Diagnosis not present

## 2016-05-26 DIAGNOSIS — R002 Palpitations: Secondary | ICD-10-CM | POA: Diagnosis not present

## 2016-05-26 DIAGNOSIS — I517 Cardiomegaly: Secondary | ICD-10-CM | POA: Diagnosis not present

## 2016-05-26 DIAGNOSIS — I340 Nonrheumatic mitral (valve) insufficiency: Secondary | ICD-10-CM | POA: Insufficient documentation

## 2016-06-09 ENCOUNTER — Encounter: Payer: Self-pay | Admitting: Registered"

## 2016-06-09 ENCOUNTER — Encounter: Payer: Medicare Other | Attending: Family Medicine | Admitting: Registered"

## 2016-06-09 DIAGNOSIS — Z6841 Body Mass Index (BMI) 40.0 and over, adult: Secondary | ICD-10-CM | POA: Diagnosis not present

## 2016-06-09 DIAGNOSIS — E669 Obesity, unspecified: Secondary | ICD-10-CM | POA: Diagnosis not present

## 2016-06-09 DIAGNOSIS — Z713 Dietary counseling and surveillance: Secondary | ICD-10-CM | POA: Diagnosis not present

## 2016-06-09 NOTE — Patient Instructions (Addendum)
Continue eating whole grains, fruits and vegetables. Vitamin D supplement may be helpful for fatigue. Consider asking doctor to have your Vit D levels checked. Consider having oatmeal more often to help control cholesterol. Also can try Benecol for butter spread. Aim to have 3 balanced meals a day with snacks if hungry in between.  Ideas for better sleep:  Get a bedtime routine to help wind down  Avoid caffeine later in the day including coffee, soda, black tea, green tea, chocolate   Consider Melatonin Extended Released supplement  Magnesium supplement in the evening may help- Calm anti-stress drink, take minimal amount to begin, can also help with constipation  Avoid watching TV or looking at the computer 1 hr before going to sleep  A lower room temperature helps sleep quality

## 2016-06-09 NOTE — Progress Notes (Signed)
Medical Nutrition Therapy:  Appt start time: 1410 end time:  1510.  Assessment:  Primary concerns today: Pt states she wants to lose 100 lbs because when she weighed that much in the past she felt better and could breathe better. Pt states she had changed eating habits a year ago and doesn't eat much bread, no sugar, and eats a lot fruit & veggies, 1% milk and frustrated with recently weight gain of about ~50 lbs.  Pt states she likes fish & chicken. Pt states she has also cut out sodium (to extreme). Hasn't had a soda in 3 months.   Pt reports low energy level especially at the end of the day which has been an issue for about 1 1/2 years. Patient states she gets energy during the night when she wants to be sleeping. Patient has not had much structure since she is on disability and is not going to school. Pt states that she has started feeling better in the last 3 weeks since her new heart doctor gave hope that her heart is in better shape than what her prior cardiologist had told her.  Assessment of potential PCOS: Menstruation: always has had bad cramps and heavy bleeder until recently. Since 2015 has started to be irregular and lighter periods (probably d/t perimenopausal (per chart problem list)  Birth Control: used seasonique for a while starting at age 71 to manage excesive bleeding & cramps Acne: no Body hair: no Thinning scalp hair: no  Weight changes: not until last Aug when started investigating potential heart issues and became very sedentary. Acanthosis nigricans: yes (per chart) Carb Cravings: chips & crackers at night (probably due to skipping meals and few carbs during the day) Fatigue: since Aug 2017  Probably not PCOS, did not adjust nutrition counseling for PCOS   Preferred Learning Style:   No preference indicated   Learning Readiness:   Ready  MEDICATIONS: reviewed   DIETARY INTAKE:  Usual eating pattern includes 1-2 meals and 1-2 snacks per day.  diet recall:   B ( AM): none OR cereal OR bacon and eggs OR bagels with cream cheese (favorite)  Snk ( AM): none  L ( PM): (will eat lunch if no breakfast) subway or [employer] OR salad, tuna Snk ( PM): none D ( PM): chicken or fish, green beans, brown rice Snk ( PM): chips, crackers Beverages: water, sugar free drop-in  Usual physical activity: walking 20 min daily working up to a goal of 30 min. Plans to get gym membership today  Estimated energy needs: 1800 calories 200 g carbohydrates 135 g protein 50 g fat  Progress Towards Goal(s):  In progress.   Nutritional Diagnosis:  NB-1.1 Food and nutrition-related knowledge deficit As related to wanting to be healthy and skipping meals to lose weight.  As evidenced by pt reported belief if she doesn't eat she will lose weight and feel better.    Intervention:  Nutrition Education. Discussed food groups and balanced meals. Demonstrated how to find nutrition facts including sodium content. Discussed appropriate sodium levels in foods and purpose in diet. Discussed importance of not skipping meals. Discussed realistic expectations for healthy weight loss. Discussed relationship of sleep quality and weight, mood and overall health.  Plan:  Continue eating whole grains, fruits and vegetables.  Vitamin D supplement may be helpful for fatigue. Consider asking doctor to have your Vit D levels checked.  Consider having oatmeal more often to help control cholesterol. Also can try Benecol for butter spread.  Aim to have 3 balanced meals a day with snacks if hungry in between.  Ideas for better sleep:  Get a bedtime routine to help wind down  Avoid caffeine later in the day including coffee, soda, black tea, green tea, chocolate   Consider Melatonin Extended Released supplement  Magnesium supplement in the evening may help- Calm anti-stress drink, take minimal amount to begin, can also help with constipation  Avoid watching TV or looking at the  computer 1 hr before going to sleep  A lower room temperature helps sleep quality  Teaching Method Utilized:  Visual Auditory  Barriers to learning/adherence to lifestyle change: none  Demonstrated degree of understanding via:  Teach Back   Monitoring/Evaluation:  Dietary intake, exercise, and body weight in 5 week(s).

## 2016-07-02 ENCOUNTER — Ambulatory Visit (HOSPITAL_BASED_OUTPATIENT_CLINIC_OR_DEPARTMENT_OTHER): Payer: Medicare Other | Attending: Internal Medicine

## 2016-07-14 ENCOUNTER — Ambulatory Visit: Payer: Medicare Other | Admitting: Registered"

## 2016-07-15 DIAGNOSIS — F17290 Nicotine dependence, other tobacco product, uncomplicated: Secondary | ICD-10-CM | POA: Diagnosis not present

## 2016-07-15 DIAGNOSIS — I10 Essential (primary) hypertension: Secondary | ICD-10-CM | POA: Diagnosis not present

## 2016-07-15 DIAGNOSIS — E785 Hyperlipidemia, unspecified: Secondary | ICD-10-CM | POA: Diagnosis not present

## 2016-08-15 ENCOUNTER — Ambulatory Visit (HOSPITAL_COMMUNITY)
Admission: EM | Admit: 2016-08-15 | Discharge: 2016-08-15 | Disposition: A | Discharge status: Home / Self Care | Payer: Medicare Other | Attending: Family Medicine | Admitting: Family Medicine

## 2016-08-15 ENCOUNTER — Encounter (HOSPITAL_COMMUNITY): Payer: Self-pay | Admitting: Emergency Medicine

## 2016-08-15 DIAGNOSIS — J209 Acute bronchitis, unspecified: Secondary | ICD-10-CM | POA: Diagnosis not present

## 2016-08-15 MED ORDER — HYDROCOD POLST-CPM POLST ER 10-8 MG/5ML PO SUER: 5.0000 mL | Freq: Two times a day (BID) | ORAL | 0 refills | Status: DC | PRN

## 2016-08-15 NOTE — ED Provider Notes (Signed)
CSN: [id number]     Arrival date & time 08/15/16  1658 History   First MD Initiated Contact with Patient 08/15/16 1821     Chief Complaint  Patient presents with  . URI   (Consider location/radiation/quality/duration/timing/severity/associated sxs/prior Treatment) The history is provided by the patient.  Cough  Cough characteristics:  Dry (became productive yesterday) Duration:  3 days Timing:  Constant Progression:  Worsening Chronicity:  New Smoker: no   Ineffective treatments:  None tried Associated symptoms: chest pain, sinus congestion and wheezing   Associated symptoms: no chills, no fever, no headaches, no rhinorrhea, no shortness of breath and no sore throat     Past Medical History:  Diagnosis Date  . Depressed   . Fibroids   . GERD (gastroesophageal reflux disease)   . Hypertension   . Obesity    Past Surgical History:  Procedure Laterality Date  . DILATION AND CURETTAGE OF UTERUS  09/2006  . TUBAL LIGATION     Family History  Problem Relation Age of Onset  . Heart failure Mother    Social History  Substance Use Topics  . Smoking status: Never Smoker  . Smokeless tobacco: Never Used  . Alcohol use 0.0 oz/week     Comment: occasional-wine   OB History    Gravida Para Term Preterm AB Living   17 7 6 1 3 7    SAB TAB Ectopic Multiple Live Births   2   1 0 7     Review of Systems  Constitutional: Negative for chills, fatigue and fever.  HENT: Positive for congestion. Negative for rhinorrhea and sore throat.   Respiratory: Positive for cough and wheezing. Negative for shortness of breath.   Cardiovascular: Positive for chest pain.       CP during cough  Gastrointestinal: Negative for abdominal pain and nausea.  Neurological: Negative for dizziness and headaches.    Allergies  Darvocet [propoxyphene n-acetaminophen]; Darvon [propoxyphene hcl]; Percocet [oxycodone-acetaminophen]; and Stadol [butorphanol]  Home Medications   Prior to Admission  medications   Medication Sig Start Date End Date Taking? Authorizing Provider  aspirin EC 81 MG tablet Take 81 mg by mouth daily.   Yes [provider]  hydrochlorothiazide (HYDRODIURIL) 25 MG tablet Take 12.5 mg by mouth daily.   Yes [provider]  metoprolol tartrate (LOPRESSOR) 25 MG tablet Take 25 mg by mouth daily.   Yes [provider]  albuterol (PROVENTIL HFA;VENTOLIN HFA) 108 (90 Base) MCG/ACT inhaler Inhale 2 puffs into the lungs every 6 (six) hours as needed for wheezing or shortness of breath. 01/06/16   Katy Apo, NP  ALPRAZolam Duanne Moron) 0.25 MG tablet Take 0.25 mg by mouth at bedtime as needed for anxiety.    [provider]  chlorpheniramine-HYDROcodone (TUSSIONEX PENNKINETIC ER) 10-8 MG/5ML SUER Take 5 mLs by mouth every 12 (twelve) hours as needed for cough. 08/15/16 08/22/16  Barry Dienes, NP  guaiFENesin-codeine (ROBITUSSIN AC) 100-10 MG/5ML syrup Take 10 mLs by mouth 3 (three) times daily as needed for cough. Patient not taking: Reported on 02/06/2016 01/06/16   Katy Apo, NP  lidocaine (LIDODERM) 5 % Place 1 patch onto the skin daily. Remove & Discard patch within 12 hours or as directed by MD 03/10/16   Pattricia Boss, MD  nitroGLYCERIN (NITROSTAT) 0.3 MG SL tablet Place 0.3 mg under the tongue every 5 (five) minutes as needed for chest pain.    [provider]  Pseudoephedrine-Ibuprofen (ADVIL COLD & SINUS LIQUI-GELS) 30-200 MG CAPS  Take 2 capsules by mouth every 6 (six) hours as needed. For pain/cold/allergy symptoms    [provider]   Meds Ordered and Administered this Visit  Medications - No data to display  BP (!) 122/58 (BP Location: Right Arm) Comment (BP Location): large cuff  Pulse 74   Temp 98.4 F (36.9 C) (Oral)   Resp (!) 22   Wt 293 lb 6.9 oz (133.1 kg)   SpO2 99%   BMI 50.37 kg/m  No data found.   Physical Exam  Constitutional: She is oriented to person, place, and time. She appears  well-developed and well-nourished.  HENT:  Head: Normocephalic and atraumatic.  Right Ear: External ear normal.  Left Ear: External ear normal.  Nose: Nose normal.  Mouth/Throat: Oropharynx is clear and moist. No oropharyngeal exudate.  Eyes: Conjunctivae are normal. Pupils are equal, round, and reactive to light.  Neck: Normal range of motion. Neck supple.  Cardiovascular: Normal rate, regular rhythm and normal heart sounds.   Pulmonary/Chest: Effort normal and breath sounds normal. She has no wheezes.  Abdominal: Soft. Bowel sounds are normal. There is no tenderness.  Lymphadenopathy:    She has no cervical adenopathy.  Neurological: She is alert and oriented to person, place, and time.  Skin: Skin is warm and dry. No rash noted.  Nursing note and vitals reviewed.   Urgent Care Course     Procedures (including critical care time)  Labs Review Labs Reviewed - No data to display  Imaging Review No results found.   MDM   1. Acute bronchitis, unspecified organism    RX for tussionex-pennkinetic given. Patient states that she is not allergic to hydrocodone. Education provided. May continue Ventolin inhaler at home PRN for wheezing. F/u with PCP for no improvement.     Barry Dienes, NP 08/15/16 [phone]

## 2016-08-15 NOTE — ED Triage Notes (Signed)
Patient complains of bronchitis for a few days, no energy.  Patient reports this is how she feels with bronchitis.  Patient has a cough, congestion.  Productive cough-pale yellow and thick

## 2016-08-18 ENCOUNTER — Emergency Department (HOSPITAL_COMMUNITY)
Admission: EM | Admit: 2016-08-18 | Discharge: 2016-08-18 | Disposition: A | Discharge status: Home / Self Care | Payer: Medicare Other | Attending: Emergency Medicine | Admitting: Emergency Medicine

## 2016-08-18 ENCOUNTER — Encounter (HOSPITAL_COMMUNITY): Payer: Self-pay | Admitting: Vascular Surgery

## 2016-08-18 ENCOUNTER — Emergency Department (HOSPITAL_COMMUNITY): Payer: Medicare Other

## 2016-08-18 DIAGNOSIS — Z79899 Other long term (current) drug therapy: Secondary | ICD-10-CM | POA: Diagnosis not present

## 2016-08-18 DIAGNOSIS — J219 Acute bronchiolitis, unspecified: Secondary | ICD-10-CM | POA: Insufficient documentation

## 2016-08-18 DIAGNOSIS — I10 Essential (primary) hypertension: Secondary | ICD-10-CM | POA: Insufficient documentation

## 2016-08-18 DIAGNOSIS — Z7982 Long term (current) use of aspirin: Secondary | ICD-10-CM | POA: Diagnosis not present

## 2016-08-18 DIAGNOSIS — R0602 Shortness of breath: Secondary | ICD-10-CM | POA: Diagnosis not present

## 2016-08-18 DIAGNOSIS — J209 Acute bronchitis, unspecified: Secondary | ICD-10-CM

## 2016-08-18 DIAGNOSIS — J9801 Acute bronchospasm: Secondary | ICD-10-CM | POA: Diagnosis not present

## 2016-08-18 DIAGNOSIS — J40 Bronchitis, not specified as acute or chronic: Secondary | ICD-10-CM | POA: Diagnosis not present

## 2016-08-18 MED ORDER — GUAIFENESIN-CODEINE 100-10 MG/5ML PO SOLN: 5.0000 mL | Freq: Four times a day (QID) | ORAL | 0 refills | Status: DC | PRN

## 2016-08-18 MED ORDER — DM-GUAIFENESIN ER 30-600 MG PO TB12: 1.0000 | ORAL_TABLET | Freq: Two times a day (BID) | ORAL | 0 refills | Status: DC

## 2016-08-18 MED ORDER — ALBUTEROL SULFATE (2.5 MG/3ML) 0.083% IN NEBU
5.0000 mg | INHALATION_SOLUTION | Freq: Once | RESPIRATORY_TRACT | Status: AC
  Administered 2016-08-18: 5 mg via RESPIRATORY_TRACT

## 2016-08-18 MED ORDER — ALBUTEROL SULFATE (2.5 MG/3ML) 0.083% IN NEBU: 2.5000 mg | INHALATION_SOLUTION | Freq: Once | RESPIRATORY_TRACT | Status: DC

## 2016-08-18 MED ORDER — ALBUTEROL SULFATE (2.5 MG/3ML) 0.083% IN NEBU
INHALATION_SOLUTION | RESPIRATORY_TRACT | Status: AC
  Administered 2016-08-18: 2.5 mg
  Filled 2016-08-18: qty 6

## 2016-08-18 MED ORDER — IPRATROPIUM-ALBUTEROL 0.5-2.5 (3) MG/3ML IN SOLN: 3.0000 mL | Freq: Once | RESPIRATORY_TRACT | Status: DC

## 2016-08-18 MED ORDER — PREDNISONE 20 MG PO TABS: 40.0000 mg | ORAL_TABLET | Freq: Every day | ORAL | 0 refills | Status: DC

## 2016-08-18 MED ORDER — ALBUTEROL SULFATE (2.5 MG/3ML) 0.083% IN NEBU
INHALATION_SOLUTION | RESPIRATORY_TRACT | Status: AC
  Filled 2016-08-18: qty 3

## 2016-08-18 MED ORDER — IPRATROPIUM BROMIDE 0.02 % IN SOLN
0.5000 mg | Freq: Once | RESPIRATORY_TRACT | Status: AC
  Administered 2016-08-18: 0.5 mg via RESPIRATORY_TRACT
  Filled 2016-08-18: qty 2.5

## 2016-08-18 NOTE — ED Notes (Signed)
When this RN got pt from waiting room pt cussing at this RN stating "this is fucking ridiculous I'll never come here again" This RN apologized about wait time.

## 2016-08-18 NOTE — ED Notes (Signed)
At desk reporting worsening SOB.  Breathing appears easy and nonlabored at this time.  Physician notified.  Orders given for repeat breathing treatment.  Taken to triage to get treatment.

## 2016-08-18 NOTE — Discharge Instructions (Signed)
Contact a health care provider if: °You have muscle aches. °You have chest pain. °The mucus that you cough up (sputum) changes from clear or white to yellow, green, gray, or bloody. °You have a fever. °Your sputum gets thicker. °Get help right away if: °Your wheezing and coughing get worse, even after you take your prescribed medicines. °It gets even harder to breathe. °You develop severe chest pain. °

## 2016-08-18 NOTE — ED Notes (Signed)
Pt verbalized understanding discharge instructions and denies any further needs or questions at this time. VS stable, ambulatory and steady gait.   

## 2016-08-18 NOTE — ED Triage Notes (Signed)
Pt reports to the ED for eval of SOB and cough. She states that she was seen at the [facility] and dx with acute bronchitis. She was given rx for an inhaler and cough syrup. She was unable to get the cough d/t the cost of her the syrup. She states that today she had increased SOB with exertion and she continues to have a productive yellow cough and she wants to make sure she does have PNA.

## 2016-08-18 NOTE — ED Provider Notes (Signed)
[facility] DEPT Provider Note   CSN: [id number] Arrival date & time: 08/18/16  1546     History   Chief Complaint Chief Complaint  Patient presents with  . Shortness of Breath    HPI Sarah Mathews is a 51 y.o. female  Recently diagnosed with bronchitis. She has had persistent cough and bronchospasm Relived briefly by Albuterol She states that she was given tussionex and was unable to afford the mediation and came to the ED for a different medicine. She denies any worsening of her sxs. HPI  Past Medical History:  Diagnosis Date  . Depressed   . Fibroids   . GERD (gastroesophageal reflux disease)   . Hypertension   . Obesity     Patient Active Problem List   Diagnosis Date Noted  . SUI (stress urinary incontinence, female) 02/06/2016  . Dysmenorrhea 12/25/2015  . Abnormal uterine bleeding (AUB) 12/25/2015  . Perimenopausal 12/25/2015  . Metatarsal deformity 11/09/2014  . Pain, foot 02/07/2013  . Equinus deformity of foot, acquired 02/07/2013  . Tenosynovitis of foot and ankle 02/07/2013    Past Surgical History:  Procedure Laterality Date  . DILATION AND CURETTAGE OF UTERUS  09/2006  . TUBAL LIGATION      OB History    Gravida Para Term Preterm AB Living   17 7 6 1 3 7    SAB TAB Ectopic Multiple Live Births   2   1 0 7       Home Medications    Prior to Admission medications   Medication Sig Start Date End Date Taking? Authorizing Provider  acetaminophen (TYLENOL) 500 MG tablet Take 500 mg by mouth as needed.   Yes [provider]  ALPRAZolam (XANAX) 0.25 MG tablet Take 0.25 mg by mouth at bedtime as needed for anxiety.   Yes [provider]  aspirin EC 81 MG tablet Take 81 mg by mouth daily.   Yes [provider]  fluticasone (FLONASE) 50 MCG/ACT nasal spray Place 1 spray into both nostrils as needed. 08/11/16  Yes [provider]  guaiFENesin (MUCINEX) 600 MG 12 hr tablet Take 300 mg by mouth as needed for to loosen  phlegm.   Yes [provider]  hydrochlorothiazide (HYDRODIURIL) 25 MG tablet Take 12.5 mg by mouth daily.   Yes [provider]  metoprolol succinate (TOPROL-XL) 25 MG 24 hr tablet Take 25 mg by mouth daily. 08/04/16  Yes [provider]  albuterol (PROVENTIL HFA;VENTOLIN HFA) 108 (90 Base) MCG/ACT inhaler Inhale 2 puffs into the lungs every 6 (six) hours as needed for wheezing or shortness of breath. 01/06/16   Katy Apo, NP  chlorpheniramine-HYDROcodone (TUSSIONEX PENNKINETIC ER) 10-8 MG/5ML SUER Take 5 mLs by mouth every 12 (twelve) hours as needed for cough. Patient not taking: Reported on 08/18/2016 08/15/16 08/22/16  Barry Dienes, NP  guaiFENesin-codeine [facility]) 100-10 MG/5ML syrup Take 10 mLs by mouth 3 (three) times daily as needed for cough. Patient not taking: Reported on 08/18/2016 01/06/16   Katy Apo, NP  lidocaine (LIDODERM) 5 % Place 1 patch onto the skin daily. Remove & Discard patch within 12 hours or as directed by MD Patient not taking: Reported on 08/18/2016 03/10/16   Pattricia Boss, MD  nitroGLYCERIN (NITROSTAT) 0.3 MG SL tablet Place 0.3 mg under the tongue every 5 (five) minutes as needed for chest pain.    [provider]    Family History Family History  Problem Relation Age of Onset  . Heart  failure Mother     Social History Social History  Substance Use Topics  . Smoking status: Never Smoker  . Smokeless tobacco: Never Used  . Alcohol use 0.0 oz/week     Comment: occasional-wine     Allergies   Darvocet [propoxyphene n-acetaminophen]; Darvon [propoxyphene hcl]; Percocet [oxycodone-acetaminophen]; and Stadol [butorphanol]   Review of Systems Review of Systems  Ten systems reviewed and are negative for acute change, except as noted in the HPI.   Physical Exam Updated Vital Signs BP (!) 154/94   Pulse 66   Temp 98.3 F (36.8 C) (Oral)   Resp 17   Ht 5\' 4"  (1.626 m)   Wt 129.3 kg (285 lb)   SpO2  100%   BMI 48.92 kg/m   Physical Exam  Constitutional: She is oriented to person, place, and time. She appears well-developed and well-nourished. No distress.  HENT:  Head: Normocephalic and atraumatic.  Eyes: Conjunctivae are normal. No scleral icterus.  Neck: Normal range of motion.  Cardiovascular: Normal rate, regular rhythm and normal heart sounds.  Exam reveals no gallop and no friction rub.   No murmur heard. Pulmonary/Chest: Effort normal. No respiratory distress. She has wheezes.  Abdominal: Soft. Bowel sounds are normal. She exhibits no distension and no mass. There is no tenderness. There is no guarding.  Neurological: She is alert and oriented to person, place, and time.  Skin: Skin is warm and dry. She is not diaphoretic.  Psychiatric: Her behavior is normal.  Nursing note and vitals reviewed.    ED Treatments / Results  Labs (all labs ordered are listed, but only abnormal results are displayed) Labs Reviewed - No data to display  EKG  EKG Interpretation  Date/Time:  Tuesday August 18 2016 16:05:25 EDT Ventricular Rate:  69 PR Interval:  172 QRS Duration: 82 QT Interval:  424 QTC Calculation: 454 R Axis:   12 Text Interpretation:  Normal sinus rhythm Cannot rule out Anterior infarct , age undetermined Abnormal ECG No significant change since last tracing Confirmed by Gareth Morgan [phone]) on 08/18/2016 9:29:31 PM       Radiology Dg Chest 2 View  Result Date: 08/18/2016 CLINICAL DATA:  Productive cough and shortness of breath for 5 days. EXAM: CHEST  2 VIEW COMPARISON:  03/10/2016 FINDINGS: Lung volumes are low leading to bronchovascular crowding. The cardiomediastinal contours are normal for technique. Pulmonary vasculature is normal. No consolidation, pleural effusion, or pneumothorax. No acute osseous abnormalities are seen. IMPRESSION: Low lung volumes without acute abnormality. Electronically Signed   By: Jeb Levering M.D.   On: 08/18/2016 17:22     Procedures Procedures (including critical care time)  Medications Ordered in ED Medications  albuterol (PROVENTIL) (2.5 MG/3ML) 0.083% nebulizer solution 2.5 mg (2.5 mg Nebulization Not Given 08/18/16 2003)  albuterol (PROVENTIL) (2.5 MG/3ML) 0.083% nebulizer solution (not administered)  albuterol (PROVENTIL) (2.5 MG/3ML) 0.083% nebulizer solution 5 mg (5 mg Nebulization Given 08/18/16 1612)  albuterol (PROVENTIL) (2.5 MG/3ML) 0.083% nebulizer solution (2.5 mg  Given 08/18/16 2000)  ipratropium (ATROVENT) nebulizer solution 0.5 mg (0.5 mg Nebulization Given 08/18/16 2003)     Initial Impression / Assessment and Plan / ED Course  I have reviewed the triage vital signs and the nursing notes.  Pertinent labs & imaging results that were available during my care of the patient were reviewed by me and considered in my medical decision making (see chart for details).     Pt CXR negative for acute infiltrate. Patients symptoms are consistent  with URI, likely viral etiology. Discussed that antibiotics are not indicated for viral infections. Pt will be discharged with symptomatic treatment.  Verbalizes understanding and is agreeable with plan. Pt is hemodynamically stable & in NAD prior to dc.   Final Clinical Impressions(s) / ED Diagnoses   Final diagnoses:  Bronchitis with bronchospasm    New Prescriptions New Prescriptions   No medications on file     Margarita Mail, PA-C 08/22/16 0036    Gareth Morgan, MD 08/23/16 2043

## 2016-08-27 ENCOUNTER — Emergency Department (HOSPITAL_COMMUNITY)
Admission: EM | Admit: 2016-08-27 | Discharge: 2016-08-27 | Disposition: A | Discharge status: Home / Self Care | Payer: Medicare Other | Attending: Emergency Medicine | Admitting: Emergency Medicine

## 2016-08-27 ENCOUNTER — Encounter (HOSPITAL_COMMUNITY): Payer: Self-pay | Admitting: Emergency Medicine

## 2016-08-27 ENCOUNTER — Emergency Department (HOSPITAL_COMMUNITY): Payer: Medicare Other

## 2016-08-27 DIAGNOSIS — R0602 Shortness of breath: Secondary | ICD-10-CM | POA: Diagnosis not present

## 2016-08-27 DIAGNOSIS — R05 Cough: Secondary | ICD-10-CM | POA: Diagnosis not present

## 2016-08-27 DIAGNOSIS — I10 Essential (primary) hypertension: Secondary | ICD-10-CM | POA: Diagnosis not present

## 2016-08-27 DIAGNOSIS — Z79899 Other long term (current) drug therapy: Secondary | ICD-10-CM | POA: Insufficient documentation

## 2016-08-27 DIAGNOSIS — R059 Cough, unspecified: Secondary | ICD-10-CM

## 2016-08-27 MED ORDER — IPRATROPIUM-ALBUTEROL 0.5-2.5 (3) MG/3ML IN SOLN
RESPIRATORY_TRACT | Status: AC
  Filled 2016-08-27: qty 3

## 2016-08-27 MED ORDER — METHYLPREDNISOLONE 4 MG PO TBPK: ORAL_TABLET | ORAL | 0 refills | Status: DC

## 2016-08-27 MED ORDER — HYDROCODONE-HOMATROPINE 5-1.5 MG/5ML PO SYRP: 5.0000 mL | ORAL_SOLUTION | Freq: Two times a day (BID) | ORAL | 0 refills | Status: DC | PRN

## 2016-08-27 MED ORDER — IPRATROPIUM-ALBUTEROL 0.5-2.5 (3) MG/3ML IN SOLN
3.0000 mL | Freq: Once | RESPIRATORY_TRACT | Status: AC
  Administered 2016-08-27: 3 mL via RESPIRATORY_TRACT

## 2016-08-27 MED ORDER — HYDROCODONE-HOMATROPINE 5-1.5 MG/5ML PO SYRP
5.0000 mL | ORAL_SOLUTION | Freq: Once | ORAL | Status: AC
  Administered 2016-08-27: 5 mL via ORAL
  Filled 2016-08-27: qty 5

## 2016-08-27 MED ORDER — IPRATROPIUM-ALBUTEROL 0.5-2.5 (3) MG/3ML IN SOLN
3.0000 mL | Freq: Once | RESPIRATORY_TRACT | Status: AC
  Administered 2016-08-27: 3 mL via RESPIRATORY_TRACT
  Filled 2016-08-27: qty 3

## 2016-08-27 MED ORDER — AZITHROMYCIN 250 MG PO TABS: ORAL_TABLET | ORAL | 0 refills | Status: DC

## 2016-08-27 NOTE — Discharge Instructions (Signed)
X-ray showed no signs of pneumonia. Shows chronic bronchitis. Make sure you're continue using your inhaler. Take the Medrol Dosepak as prescribed which is a steroid. Take the antibiotics as prescribed. May use the Hycodan cough syrup. It is important to follow-up with the primary care doctor or pulmonologist. If your symptoms are not improved after taking antibiotics return to the ED for further evaluation. If her symptoms worsen in the interim return sooner to the ED.

## 2016-08-27 NOTE — ED Provider Notes (Signed)
[facility] DEPT Provider Note   CSN: [id number] Arrival date & time: 08/27/16  1448  By signing my name below, I, Dora Sims, attest that this documentation has been prepared under the direction and in the presence of Ocie Cornfield, PA-C. Electronically Signed: Dora Sims, Scribe. 08/27/2016. 3:42 PM.  History   Chief Complaint Chief Complaint  Patient presents with  . Cough   The history is provided by the patient. No language interpreter was used.    HPI Comments: Sarah Mathews is a 51 y.o. female who presents to the Emergency Department complaining of a persistent productive cough since 08/12/16. The sputum was initially yellow but is now green. She reports associated chest tightness and dyspnea secondary to cough. She also reports audible wheezine. She has been evaluated twice prior to today for the same and was diagnosed with bronchitis on 7/7. Patient has received prescriptions for Tussionex, Mucinex DM, prednisone, and guaifenesin with codeine which she has taken without relief. She also has an albuterol inhaler at home which she has used without improvement. She has received two breathing treatments here today with  improvement of her symptoms. Of note, she developed some subjective fevers last night and is concerned for pneumonia. States that last time she was diagnosed with bronchitis she develop pna. Pt feels that she needs abx. Patient denies any history of DVT/PE, prolonged immobilization, recent hospitalizations/surgeries, lower show any edema or calf tenderness. Patient denies any cardiac history. She denies lightheadedness, dizziness, headache, vision changes, nausea, emesis.  Past Medical History:  Diagnosis Date  . Depressed   . Fibroids   . GERD (gastroesophageal reflux disease)   . Hypertension   . Obesity     Patient Active Problem List   Diagnosis Date Noted  . SUI (stress urinary incontinence, female) 02/06/2016  . Dysmenorrhea 12/25/2015  . Abnormal  uterine bleeding (AUB) 12/25/2015  . Perimenopausal 12/25/2015  . Metatarsal deformity 11/09/2014  . Pain, foot 02/07/2013  . Equinus deformity of foot, acquired 02/07/2013  . Tenosynovitis of foot and ankle 02/07/2013    Past Surgical History:  Procedure Laterality Date  . DILATION AND CURETTAGE OF UTERUS  09/2006  . TUBAL LIGATION      OB History    Gravida Para Term Preterm AB Living   17 7 6 1 3 7    SAB TAB Ectopic Multiple Live Births   2   1 0 7       Home Medications    Prior to Admission medications   Medication Sig Start Date End Date Taking? Authorizing Provider  acetaminophen (TYLENOL) 500 MG tablet Take 500 mg by mouth as needed.    [provider]  albuterol (PROVENTIL HFA;VENTOLIN HFA) 108 (90 Base) MCG/ACT inhaler Inhale 2 puffs into the lungs every 6 (six) hours as needed for wheezing or shortness of breath. 01/06/16   Katy Apo, NP  ALPRAZolam Duanne Moron) 0.25 MG tablet Take 0.25 mg by mouth at bedtime as needed for anxiety.    [provider]  aspirin EC 81 MG tablet Take 81 mg by mouth daily.    [provider]  dextromethorphan-guaiFENesin (MUCINEX DM) 30-600 MG 12hr tablet Take 1 tablet by mouth 2 (two) times daily. 08/18/16   Harris, Abigail, PA-C  fluticasone (FLONASE) 50 MCG/ACT nasal spray Place 1 spray into both nostrils as needed. 08/11/16   [provider]  guaiFENesin (MUCINEX) 600 MG 12 hr tablet Take 300 mg by mouth as needed for to loosen phlegm.    [provider]  guaiFENesin-codeine 100-10 MG/5ML syrup Take 5-10 mLs by mouth every 6 (six) hours as needed for cough. 08/18/16   Harris, Vernie Shanks, PA-C  hydrochlorothiazide (HYDRODIURIL) 25 MG tablet Take 12.5 mg by mouth daily.    [provider]  metoprolol succinate (TOPROL-XL) 25 MG 24 hr tablet Take 25 mg by mouth daily. 08/04/16   [provider]  nitroGLYCERIN (NITROSTAT) 0.3 MG SL tablet Place 0.3 mg under the tongue every 5 (five)  minutes as needed for chest pain.    [provider]  predniSONE (DELTASONE) 20 MG tablet Take 2 tablets (40 mg total) by mouth daily. 08/18/16   Margarita Mail, PA-C    Family History Family History  Problem Relation Age of Onset  . Heart failure Mother     Social History Social History  Substance Use Topics  . Smoking status: Never Smoker  . Smokeless tobacco: Never Used  . Alcohol use 0.0 oz/week     Comment: occasional-wine     Allergies   Darvocet [propoxyphene n-acetaminophen]; Darvon [propoxyphene hcl]; Percocet [oxycodone-acetaminophen]; and Stadol [butorphanol]   Review of Systems Review of Systems  Constitutional: Positive for fever (subjective).  Respiratory: Positive for cough, chest tightness (secondary to wheezine), shortness of breath and wheezing.   Cardiovascular: Negative for chest pain, palpitations and leg swelling.  Gastrointestinal: Negative for nausea and vomiting.  Musculoskeletal: Negative for back pain.  Skin: Negative for color change.  Neurological: Negative for dizziness, syncope, weakness, light-headedness, numbness and headaches.   Physical Exam Updated Vital Signs BP 140/74 (BP Location: Right Arm)   Pulse 72   Temp (!) 97.3 F (36.3 C) (Oral)   Resp 18   SpO2 100%   Physical Exam  Constitutional: She is oriented to person, place, and time. She appears well-developed and well-nourished. No distress.  HENT:  Head: Normocephalic and atraumatic.  Right Ear: Tympanic membrane, external ear and ear canal normal.  Left Ear: Tympanic membrane, external ear and ear canal normal.  Nose: Nose normal.  Mouth/Throat: Uvula is midline.  Eyes: Pupils are equal, round, and reactive to light. Conjunctivae and EOM are normal.  Neck: Normal range of motion. Neck supple. No JVD present. No tracheal deviation present.  Cardiovascular: Normal rate, regular rhythm, normal heart sounds and intact distal pulses.  Exam reveals no gallop and no  friction rub.   No murmur heard. Pulmonary/Chest: Effort normal. No respiratory distress. She has wheezes (expiratory). She has no rales. She exhibits no tenderness.  No hypoxia or tachypnea.  Abdominal:  Obese abdomen.  Musculoskeletal: Normal range of motion.  No lower extremity edema. No calf tenderness.   Lymphadenopathy:    She has no cervical adenopathy.  Neurological: She is alert and oriented to person, place, and time.  Skin: Skin is warm and dry. Capillary refill takes less than 2 seconds.  Psychiatric: She has a normal mood and affect. Her behavior is normal.  Nursing note and vitals reviewed.  ED Treatments / Results  Labs (all labs ordered are listed, but only abnormal results are displayed) Labs Reviewed - No data to display  EKG  EKG Interpretation None       Radiology Dg Chest 2 View  Result Date: 08/27/2016 CLINICAL DATA:  51 year old with 1 week history of productive cough and shortness of breath. Former short-term smoker. EXAM: CHEST  2 VIEW COMPARISON:  08/18/2016, 03/10/2016 and earlier. FINDINGS: Cardiac silhouette upper normal in size, unchanged dating back to 2015. Hilar and mediastinal contours unremarkable. Suboptimal inspiration  with low lung volumes which accounts for crowded bronchovascular markings at the bases. Mild central peribronchial thickening, unchanged. Lungs otherwise clear. No localized airspace consolidation. No pleural effusions. No pneumothorax. Normal pulmonary vascularity. Mild degenerative changes involving the thoracic and upper lumbar spine. IMPRESSION: Stable mild changes of chronic bronchitis and/or asthma. No acute cardiopulmonary disease. Electronically Signed   By: Evangeline Dakin M.D.   On: 08/27/2016 16:00    Procedures Procedures (including critical care time)  DIAGNOSTIC STUDIES: Oxygen Saturation is 100% on RA, normal by my interpretation.    COORDINATION OF CARE: 3:40 PM Discussed treatment plan with pt at bedside  and pt agreed to plan.  Medications Ordered in ED Medications  ipratropium-albuterol (DUONEB) 0.5-2.5 (3) MG/3ML nebulizer solution (not administered)  ipratropium-albuterol (DUONEB) 0.5-2.5 (3) MG/3ML nebulizer solution 3 mL (3 mLs Nebulization Given 08/27/16 1503)     Initial Impression / Assessment and Plan / ED Course  I have reviewed the triage vital signs and the nursing notes.  Pertinent labs & imaging results that were available during my care of the patient were reviewed by me and considered in my medical decision making (see chart for details).     Patient presents to the ED with complaints of ongoing productive cough, wheezing, subjective fevers, chest tightness. Patient states that she has been seen several times over the past month for same. Diagnosed with bronchitis that is not improving. States last time she had bronchitis it went to pneumonia and she feels that she may need antibiotics. Patient has been trying all of her medications she was prescribed for bronchitis with little relief. Exam shows some scattered expiratory wheezes bilaterally. Otherwise patient is well-appearing and nontoxic.  Patient was given 2 DuoNeb with improvement in her chest congestion and breathing. Chest x-ray reveals no focal infiltrate but shows changes consistent with bronchitis versus asthma. Patient is PERC positive due to age but low risk wells. Clinical presentation does not seem consistent with PE or ACS. Patient symptoms likely due to chronic bronchitis. She was able to ambulate in the ED was saturations above 100%. Do not feel any further testing is indicated at this time. Will treat patient with steroid taper pack, antibiotics, inhaler. Encourage patient to follow-up with her PCP and a pulmonologist.  Pt is hemodynamically stable, in NAD, & able to ambulate in the ED. Evaluation does not show pathology that would require ongoing emergent intervention or inpatient treatment. I explained the  diagnosis to the patient. Pain has been managed & has no complaints prior to dc. Pt is comfortable with above plan and is stable for discharge at this time. All questions were answered prior to disposition. Strict return precautions for f/u to the ED were discussed. Encouraged follow up with PCP. Dicussed pt with Dr. Regenia Skeeter who is agreeable to the above plan.    Final Clinical Impressions(s) / ED Diagnoses   Final diagnoses:  Cough  SOB (shortness of breath)    New Prescriptions New Prescriptions   AZITHROMYCIN (ZITHROMAX) 250 MG TABLET    Take 2 tabs PO x 1 dose, then 1 tab PO QD x 4 days   HYDROCODONE-HOMATROPINE (HYCODAN) 5-1.5 MG/5ML SYRUP    Take 5 mLs by mouth every 12 (twelve) hours as needed for cough.   METHYLPREDNISOLONE (MEDROL DOSEPAK) 4 MG TBPK TABLET    Take as directed   I personally performed the services described in this documentation, which was scribed in my presence. The recorded information has been reviewed and is accurate.  Doristine Devoid, PA-C 08/27/16 1702    Sherwood Gambler, MD 08/27/16 2351

## 2016-08-27 NOTE — ED Triage Notes (Signed)
Pt here for cough and pain with cough; pt sts recently diagnosed with bronchitis; pt feels like she needs a breathing treatment

## 2016-08-27 NOTE — ED Notes (Signed)
Pt verbalized understanding discharge instructions and denies any further needs or questions at this time. VS stable, ambulatory and steady gait.   

## 2016-09-03 ENCOUNTER — Encounter: Payer: Self-pay | Admitting: Pulmonary Disease

## 2016-09-03 ENCOUNTER — Ambulatory Visit (INDEPENDENT_AMBULATORY_CARE_PROVIDER_SITE_OTHER): Payer: Medicare Other | Admitting: Pulmonary Disease

## 2016-09-03 DIAGNOSIS — J4530 Mild persistent asthma, uncomplicated: Secondary | ICD-10-CM

## 2016-09-03 DIAGNOSIS — J45909 Unspecified asthma, uncomplicated: Secondary | ICD-10-CM | POA: Insufficient documentation

## 2016-09-03 MED ORDER — BUDESONIDE-FORMOTEROL FUMARATE 80-4.5 MCG/ACT IN AERO: 2.0000 | INHALATION_SPRAY | Freq: Two times a day (BID) | RESPIRATORY_TRACT | 0 refills | Status: DC

## 2016-09-03 MED ORDER — CEFDINIR 300 MG PO CAPS: 300.0000 mg | ORAL_CAPSULE | Freq: Two times a day (BID) | ORAL | 0 refills | Status: DC

## 2016-09-03 NOTE — Patient Instructions (Signed)
You had an attack of asthmatic bronchitis that's taking a long time to resolve  Trial of Symbicort 80/4.52 puffs twice daily , , rinse mouth  after use. Use  Ventolin 2 puffs q 6h as needevery 6 hours as needed for rescue in between for wheezing   OK to take mucinex 600 mg daily  Doxy 100 mg daily x 5 days  Call if worse or no better in 4 weeks

## 2016-09-03 NOTE — Assessment & Plan Note (Signed)
had an attack of asthmatic bronchitis that's taking a long time to resolve  Trial of Symbicort 80/4.52 puffs twice daily , , rinse mouth  after use. Use  Ventolin 2 puffs q 6h as needevery 6 hours as needed for rescue in between for wheezing   OK to take mucinex 600 mg daily  Doxy 100 mg daily x 5 days  Call if worse or no better in 4 weeks

## 2016-09-03 NOTE — Progress Notes (Signed)
Subjective:    Patient ID: Sarah Mathews, female    DOB: [birth year], 51 y.o.   MRN: [id number]  HPI  Chief Complaint  Patient presents with  . Pulm Consult    Recurring bronchitis. Has a history of it, but this round has lasted for 3 weeks. Never lasts this long. SOB with activity. Upper back pain and chest pressure in the past few weeks but it is better now.      51 year old obese remote smoker presents as a self-referral for evaluation of bronchitis and acute cough. She reports acute onset of cough and wheezing about 3 weeks ago. She denies preceding URI symptoms but does report that soon after 2 other family members also got sick with similar symptoms. She had an urgent care visit on 7/7 and was diagnosed with acute bronchitis. She had an ER visit on 7/10 with shortness of breath persisted with wheezing, chest x-ray to my review shows some interstitial prominence but no clear infiltrate, she was given albuterol nebs 2, doxycycline and a cough syrup. She does second ER visit on 7/19, chest x-ray again shows prominent interstitium and this time given a Z-Pak with prednisone dosepak.  She feels somewhat improved but her shortness of breath persists and she has intermittent wheezing. She continues to have occasional green sputum  She denies childhood history of asthma. Lives with her daughter and does not have any pets. She does not have significant allergies or GERD.  She underwent cardiac evaluation by Dr. Terrence Dupont & dr [email or web address]. Sleep evaluation was advised   Significant tests/ events reviewed  Echo-normal LV function, RVSP 37 Event monitor -negative   Past Medical History:  Diagnosis Date  . Depressed   . Fibroids   . GERD (gastroesophageal reflux disease)   . Hypertension   . Obesity    Past Surgical History:  Procedure Laterality Date  . DILATION AND CURETTAGE OF UTERUS  09/2006  . TUBAL LIGATION      Allergies  Allergen Reactions  . Darvocet [Propoxyphene  N-Acetaminophen] Other (See Comments)    In high amount heart palpitations  . Darvon [Propoxyphene Hcl]   . Doxycycline Diarrhea  . Percocet [Oxycodone-Acetaminophen] Nausea And Vomiting  . Stadol [Butorphanol] Other (See Comments)    delierious     Social History   Social History  . Marital status: Single    Spouse name: N/A  . Number of children: N/A  . Years of education: N/A   Occupational History  . Not on file.   Social History Main Topics  . Smoking status: Never Smoker  . Smokeless tobacco: Never Used  . Alcohol use 0.0 oz/week     Comment: occasional-wine  . Drug use: No  . Sexual activity: Yes    Birth control/ protection: Surgical   Other Topics Concern  . Not on file   Social History Narrative  . No narrative on file    Family History  Problem Relation Age of Onset  . Heart failure Mother      Review of Systems   Constitutional: negative for anorexia, fevers and sweats  Eyes: negative for irritation, redness and visual disturbance  Ears, nose, mouth, throat, and face: negative for earaches, epistaxis, nasal congestion and sore throat  Cardiovascular: negative for chest pain, dyspnea, lower extremity edema, orthopnea, palpitations and syncope  Gastrointestinal: negative for abdominal pain, constipation, diarrhea, melena, nausea and vomiting  Genitourinary:negative for dysuria, frequency and hematuria  Hematologic/lymphatic: negative for bleeding, easy bruising and lymphadenopathy  Musculoskeletal:negative for  arthralgias, muscle weakness and stiff joints  Neurological: negative for coordination problems, gait problems, headaches and weakness  Endocrine: negative for diabetic symptoms including polydipsia, polyuria and weight loss     Objective:   Physical Exam  Gen. Pleasant, obese, in no distress, normal affect ENT -  no post nasal drip, class 2-3 airway Neck: No JVD, no thyromegaly, no carotid bruits Lungs: no use of accessory muscles, no  dullness to percussion, decreased without rales or rhonchi  Cardiovascular: Rhythm regular, heart sounds  normal, no murmurs or gallops, no peripheral edema Abdomen: soft and non-tender, no hepatosplenomegaly, BS normal. Musculoskeletal: No deformities, no cyanosis or clubbing Neuro:  alert, non focal, no tremors       Assessment & Plan:

## 2016-10-14 DIAGNOSIS — F419 Anxiety disorder, unspecified: Secondary | ICD-10-CM | POA: Diagnosis not present

## 2016-10-14 DIAGNOSIS — F17290 Nicotine dependence, other tobacco product, uncomplicated: Secondary | ICD-10-CM | POA: Diagnosis not present

## 2016-10-14 DIAGNOSIS — I10 Essential (primary) hypertension: Secondary | ICD-10-CM | POA: Diagnosis not present

## 2016-10-14 DIAGNOSIS — Z6841 Body Mass Index (BMI) 40.0 and over, adult: Secondary | ICD-10-CM | POA: Diagnosis not present

## 2016-10-14 DIAGNOSIS — E785 Hyperlipidemia, unspecified: Secondary | ICD-10-CM | POA: Diagnosis not present

## 2016-10-15 DIAGNOSIS — E785 Hyperlipidemia, unspecified: Secondary | ICD-10-CM | POA: Diagnosis not present

## 2016-10-15 DIAGNOSIS — I10 Essential (primary) hypertension: Secondary | ICD-10-CM | POA: Diagnosis not present

## 2016-10-15 DIAGNOSIS — E669 Obesity, unspecified: Secondary | ICD-10-CM | POA: Diagnosis not present

## 2016-11-24 DIAGNOSIS — F419 Anxiety disorder, unspecified: Secondary | ICD-10-CM | POA: Diagnosis not present

## 2016-11-24 DIAGNOSIS — R002 Palpitations: Secondary | ICD-10-CM | POA: Diagnosis not present

## 2016-11-24 DIAGNOSIS — J209 Acute bronchitis, unspecified: Secondary | ICD-10-CM | POA: Diagnosis not present

## 2016-11-24 DIAGNOSIS — I10 Essential (primary) hypertension: Secondary | ICD-10-CM | POA: Diagnosis not present

## 2016-11-24 DIAGNOSIS — E785 Hyperlipidemia, unspecified: Secondary | ICD-10-CM | POA: Diagnosis not present

## 2016-11-24 DIAGNOSIS — K219 Gastro-esophageal reflux disease without esophagitis: Secondary | ICD-10-CM | POA: Diagnosis not present

## 2016-12-02 DIAGNOSIS — R002 Palpitations: Secondary | ICD-10-CM | POA: Diagnosis not present

## 2016-12-04 ENCOUNTER — Encounter (HOSPITAL_COMMUNITY): Payer: Medicare Other

## 2016-12-14 ENCOUNTER — Telehealth (HOSPITAL_COMMUNITY): Payer: Self-pay

## 2016-12-14 NOTE — Telephone Encounter (Signed)
CHF Clinic appointment reminder call placed to patient for upcoming post-hospital follow up.  Does understand purpose of this appointment and where CHF Clinic is located? Yes  How is patient feeling? Well, does complain of continued frequent palpitations and frustrated that she is not getting any answers for it. Does currently have on heart monitor from [facility]. Does not c/o any CHF related s/s at this time.  Does patient have all of their medications since their recent discharge? Yes  Patient also reminded to take all medications as prescribed on the day of his/her appointment and to bring all medications to this appointment.  Advised to call our office for tardiness or cancellations/rescheduling needs.  Leory Plowman, Guinevere Ferrari

## 2016-12-15 ENCOUNTER — Ambulatory Visit (HOSPITAL_COMMUNITY)
Admission: RE | Admit: 2016-12-15 | Discharge: 2016-12-15 | Disposition: A | Discharge status: Home / Self Care | Payer: Medicare Other | Source: Ambulatory Visit | Attending: Internal Medicine | Admitting: Internal Medicine

## 2016-12-15 VITALS — BP 124/76 | HR 72 | Wt 288.6 lb

## 2016-12-15 DIAGNOSIS — I10 Essential (primary) hypertension: Secondary | ICD-10-CM | POA: Insufficient documentation

## 2016-12-15 DIAGNOSIS — R0683 Snoring: Secondary | ICD-10-CM | POA: Insufficient documentation

## 2016-12-15 DIAGNOSIS — R002 Palpitations: Secondary | ICD-10-CM | POA: Insufficient documentation

## 2016-12-15 DIAGNOSIS — R5383 Other fatigue: Secondary | ICD-10-CM | POA: Diagnosis not present

## 2016-12-15 DIAGNOSIS — Z7982 Long term (current) use of aspirin: Secondary | ICD-10-CM | POA: Diagnosis not present

## 2016-12-15 DIAGNOSIS — K219 Gastro-esophageal reflux disease without esophagitis: Secondary | ICD-10-CM | POA: Insufficient documentation

## 2016-12-15 DIAGNOSIS — R0609 Other forms of dyspnea: Secondary | ICD-10-CM | POA: Insufficient documentation

## 2016-12-15 DIAGNOSIS — Z6841 Body Mass Index (BMI) 40.0 and over, adult: Secondary | ICD-10-CM | POA: Insufficient documentation

## 2016-12-15 DIAGNOSIS — Z87891 Personal history of nicotine dependence: Secondary | ICD-10-CM | POA: Diagnosis not present

## 2016-12-15 DIAGNOSIS — F419 Anxiety disorder, unspecified: Secondary | ICD-10-CM | POA: Insufficient documentation

## 2016-12-15 DIAGNOSIS — Z79899 Other long term (current) drug therapy: Secondary | ICD-10-CM | POA: Diagnosis not present

## 2016-12-15 NOTE — Addendum Note (Signed)
Encounter addended by: Louann Liv, LCSW on: 12/15/2016 4:02 PM  Actions taken: Sign clinical note

## 2016-12-15 NOTE — Progress Notes (Signed)
[facility] NEW PATIENT NOTE  Referring Physician: Self Referral  Primary Care: None  Primary Cardiologist: Dr Terrence Dupont  HPI: Sarah Mathews was a self referral because her mom, [name] is a patient in the clinic.   Sarah Mathews is a 51 year old with a history of morbid obesity, GERD, HTN, and former smoker (quit 18 year ago). She does not have a cardiac history.  Today she returns for HF follow up. Wearing heart monitor for 4 weeks. Monitor was started by Dr Terrence Dupont due to palpitations. She did not show up for sleep study. Has ongoing day time fatigue and snores.  Says she has anxiety and constantly worries about dying. Denies SOB. Able to walk up steps. Denies CO.   ECHO 05/2016 EF 65-70%. RV normal    Past Medical History:  Diagnosis Date  . Depressed   . Fibroids   . GERD (gastroesophageal reflux disease)   . Hypertension   . Obesity     Current Outpatient Medications  Medication Sig Dispense Refill  . acetaminophen (TYLENOL) 500 MG tablet Take 500 mg by mouth as needed.    [name] albuterol (PROVENTIL HFA;VENTOLIN HFA) 108 (90 Base) MCG/ACT inhaler Inhale 2 puffs into the lungs every 6 (six) hours as needed for wheezing or shortness of breath. 1 Inhaler 0  . ALPRAZolam (XANAX) 0.25 MG tablet Take 0.25 mg by mouth at bedtime as needed for anxiety.    [name] aspirin EC 81 MG tablet Take 81 mg by mouth daily.    . hydrochlorothiazide (HYDRODIURIL) 25 MG tablet Take 12.5 mg by mouth daily.    . metoprolol succinate (TOPROL-XL) 25 MG 24 hr tablet Take 25 mg by mouth daily.  3  . omeprazole (PRILOSEC) 20 MG capsule Take 20 mg daily by mouth.    . budesonide-formoterol (SYMBICORT) 80-4.5 MCG/ACT inhaler Inhale 2 puffs into the lungs 2 (two) times daily. (Patient not taking: Reported on 12/15/2016) 1 Inhaler 0  . cefdinir (OMNICEF) 300 MG capsule Take 1 capsule (300 mg total) by mouth 2 (two) times daily. (Patient not taking: Reported on 12/15/2016) 10 capsule 0  .  dextromethorphan-guaiFENesin (MUCINEX DM) 30-600 MG 12hr tablet Take 1 tablet by mouth 2 (two) times daily. (Patient not taking: Reported on 12/15/2016) 40 tablet 0  . fluticasone (FLONASE) 50 MCG/ACT nasal spray Place 1 spray into both nostrils as needed.    [name] guaiFENesin (MUCINEX) 600 MG 12 hr tablet Take 300 mg by mouth as needed for to loosen phlegm.    [name] HYDROcodone-homatropine (HYCODAN) 5-1.5 MG/5ML syrup Take 5 mLs by mouth every 12 (twelve) hours as needed for cough. (Patient not taking: Reported on 12/15/2016) 25 mL 0  . methylPREDNISolone (MEDROL DOSEPAK) 4 MG TBPK tablet Take as directed (Patient not taking: Reported on 12/15/2016) 21 tablet 0  . nitroGLYCERIN (NITROSTAT) 0.3 MG SL tablet Place 0.3 mg under the tongue every 5 (five) minutes as needed for chest pain.    . predniSONE (DELTASONE) 20 MG tablet Take 2 tablets (40 mg total) by mouth daily. (Patient not taking: Reported on 12/15/2016) 10 tablet 0   No current facility-administered medications for this encounter.     Allergies  Allergen Reactions  . Darvocet [Propoxyphene N-Acetaminophen] Other (See Comments)    In high amount heart palpitations  . Darvon [Propoxyphene Hcl]   . Doxycycline Diarrhea  . Percocet [Oxycodone-Acetaminophen] Nausea And Vomiting  . Stadol [Butorphanol] Other (See Comments)    delierious      Social History  Socioeconomic History  . Marital status: Single    Spouse name: Not on file  . Number of children: Not on file  . Years of education: Not on file  . Highest education level: Not on file  Social Needs  . Financial resource strain: Not on file  . Food insecurity - worry: Not on file  . Food insecurity - inability: Not on file  . Transportation needs - medical: Not on file  . Transportation needs - non-medical: Not on file  Occupational History  . Not on file  Tobacco Use  . Smoking status: Never Smoker  . Smokeless tobacco: Never Used  Substance and Sexual Activity  . Alcohol  use: Yes    Alcohol/week: 0.0 oz    Comment: occasional-wine  . Drug use: No  . Sexual activity: Yes    Birth control/protection: Surgical  Other Topics Concern  . Not on file  Social History Narrative  . Not on file      Family History  Problem Relation Age of Onset  . Heart failure Mother     Vitals:   12/15/16 1427  BP: 124/76  Pulse: 72  SpO2: 100%  Weight: 288 lb 9.6 oz (130.9 kg)    PHYSICAL EXAM: General:  Well appearing. No resp difficulty. Walked in the clinic without difficulty. Daughter present.  HEENT: normal Neck: supple. no JVD. Carotids 2+ bilat; no bruits. No lymphadenopathy or thryomegaly appreciated. Cor: PMI nondisplaced. Regular rate & rhythm. No rubs, gallops or murmurs. Lungs: clear Abdomen: obese, soft, nontender, nondistended. No hepatosplenomegaly. No bruits or masses. Good bowel sounds. Extremities: no cyanosis, clubbing, rash, edema Neuro: alert & orientedx3, cranial nerves grossly intact. moves all 4 extremities w/o difficulty. Affect pleasant   ASSESSMENT & PLAN:  1. Exertional dyspnea  Resolved. ECHO normal LVEF 65-70% and RV normal .  2. Palpitations - Wearing 30 day monitor. Dr Terrence Dupont orderd and will follow results.  3. Snoring/Day time fatigue - Likely has OSA -  set up for sleep study. She failed to report for study.  -Today we revisited and she is agreeable.  4. GERD 5. Obesity Body mass index is 49.54 kg/m. Discussed portion control  6. Anxiety On xanax. Asked to follow up with PCP.   She was referred to [facility] for PCP.  Follow up with HF clinic as needed.    Darrick Grinder, NP  2:36 PM

## 2016-12-15 NOTE — Patient Instructions (Signed)
We will arrange to have your sleep study rescheduled.  Your physician has recommended that you have a sleep study. This test records several body functions during sleep, including: brain activity, eye movement, oxygen and carbon dioxide blood levels, heart rate and rhythm, breathing rate and rhythm, the flow of air through your mouth and nose, snoring, body muscle movements, and chest and belly movement.    Your physician recommends that you schedule a follow-up appointment as needed in then CHF

## 2016-12-15 NOTE — Progress Notes (Signed)
CSW referred to assist with PCP. Patient states she had a PCP although "closed up shop". Patient has Medicare and Medicaid and was assigned to a MD although was recently told by her caseworker that if she was to locate a new provider to return call to [facility] caseworker. CSW provided number for the [facility] provider network and patient will follow up. Patient will return call to CSW if further needs arise. Sarah Mathews, [place], [place]

## 2017-01-06 DIAGNOSIS — J209 Acute bronchitis, unspecified: Secondary | ICD-10-CM | POA: Diagnosis not present

## 2017-01-06 DIAGNOSIS — E785 Hyperlipidemia, unspecified: Secondary | ICD-10-CM | POA: Diagnosis not present

## 2017-01-06 DIAGNOSIS — I10 Essential (primary) hypertension: Secondary | ICD-10-CM | POA: Diagnosis not present

## 2017-01-06 DIAGNOSIS — R002 Palpitations: Secondary | ICD-10-CM | POA: Diagnosis not present

## 2017-01-06 DIAGNOSIS — F17290 Nicotine dependence, other tobacco product, uncomplicated: Secondary | ICD-10-CM | POA: Diagnosis not present

## 2017-01-06 DIAGNOSIS — F419 Anxiety disorder, unspecified: Secondary | ICD-10-CM | POA: Diagnosis not present

## 2017-01-06 DIAGNOSIS — K219 Gastro-esophageal reflux disease without esophagitis: Secondary | ICD-10-CM | POA: Diagnosis not present

## 2017-01-07 ENCOUNTER — Encounter (HOSPITAL_COMMUNITY): Payer: Medicare Other | Admitting: Internal Medicine

## 2017-01-10 ENCOUNTER — Emergency Department (HOSPITAL_COMMUNITY)
Admission: EM | Admit: 2017-01-10 | Discharge: 2017-01-10 | Disposition: A | Discharge status: Home / Self Care | Payer: Medicare Other | Attending: Emergency Medicine | Admitting: Emergency Medicine

## 2017-01-10 ENCOUNTER — Emergency Department (HOSPITAL_COMMUNITY): Payer: Medicare Other

## 2017-01-10 ENCOUNTER — Encounter (HOSPITAL_COMMUNITY): Payer: Self-pay

## 2017-01-10 DIAGNOSIS — I10 Essential (primary) hypertension: Secondary | ICD-10-CM | POA: Diagnosis not present

## 2017-01-10 DIAGNOSIS — R0981 Nasal congestion: Secondary | ICD-10-CM | POA: Diagnosis not present

## 2017-01-10 DIAGNOSIS — R0602 Shortness of breath: Secondary | ICD-10-CM

## 2017-01-10 DIAGNOSIS — R05 Cough: Secondary | ICD-10-CM | POA: Diagnosis not present

## 2017-01-10 DIAGNOSIS — R059 Cough, unspecified: Secondary | ICD-10-CM

## 2017-01-10 MED ORDER — ALBUTEROL SULFATE HFA 108 (90 BASE) MCG/ACT IN AERS: 1.0000 | INHALATION_SPRAY | Freq: Four times a day (QID) | RESPIRATORY_TRACT | 0 refills | Status: DC | PRN

## 2017-01-10 MED ORDER — PREDNISONE 20 MG PO TABS
40.0000 mg | ORAL_TABLET | Freq: Once | ORAL | Status: AC
  Administered 2017-01-10: 40 mg via ORAL
  Filled 2017-01-10: qty 2

## 2017-01-10 MED ORDER — AEROCHAMBER PLUS FLO-VU LARGE MISC
Status: AC
  Filled 2017-01-10: qty 1

## 2017-01-10 MED ORDER — BENZONATATE 100 MG PO CAPS: 100.0000 mg | ORAL_CAPSULE | Freq: Three times a day (TID) | ORAL | 0 refills | Status: DC

## 2017-01-10 MED ORDER — ALBUTEROL SULFATE HFA 108 (90 BASE) MCG/ACT IN AERS
2.0000 | INHALATION_SPRAY | Freq: Once | RESPIRATORY_TRACT | Status: AC
  Administered 2017-01-10: 2 via RESPIRATORY_TRACT
  Filled 2017-01-10: qty 6.7

## 2017-01-10 MED ORDER — ACETAMINOPHEN 500 MG PO TABS
1000.0000 mg | ORAL_TABLET | Freq: Once | ORAL | Status: AC
  Administered 2017-01-10: 1000 mg via ORAL
  Filled 2017-01-10: qty 2

## 2017-01-10 MED ORDER — IPRATROPIUM-ALBUTEROL 0.5-2.5 (3) MG/3ML IN SOLN
3.0000 mL | Freq: Once | RESPIRATORY_TRACT | Status: AC
  Administered 2017-01-10: 3 mL via RESPIRATORY_TRACT
  Filled 2017-01-10: qty 3

## 2017-01-10 MED ORDER — PREDNISONE 20 MG PO TABS: 40.0000 mg | ORAL_TABLET | Freq: Every day | ORAL | 0 refills | Status: AC

## 2017-01-10 MED ORDER — AMOXICILLIN-POT CLAVULANATE 875-125 MG PO TABS: 1.0000 | ORAL_TABLET | Freq: Two times a day (BID) | ORAL | 0 refills | Status: DC

## 2017-01-10 MED ORDER — AEROCHAMBER PLUS FLO-VU LARGE MISC
1.0000 | Freq: Once | Status: AC
Start: 2017-01-10 — End: 2017-01-10
  Administered 2017-01-10: 1

## 2017-01-10 NOTE — ED Notes (Signed)
See providers notes

## 2017-01-10 NOTE — ED Triage Notes (Signed)
Patient here with ongoing cough and congestion, concerned that the otc meds are raising her BP. No distress, taking a z-pack

## 2017-01-10 NOTE — ED Notes (Addendum)
Charted on wrong pt in error

## 2017-01-10 NOTE — ED Provider Notes (Signed)
Emergency Department Provider Note   I have reviewed the triage vital signs and the nursing notes.   HISTORY  Chief Complaint cough/congestion/BP check   HPI Sarah Mathews is a 51 y.o. female with PMH of HTN and asthma presents to the ED with intermittent cough, congestion, and SOB over the last several weeks but worsening over the last several days. Patient is on Azithromycin and has tried OTC medications and albuterol with only mild relief in symptoms. No fever or chills. Patient also with worsening face pain and congestion symptoms. No CP. No vomiting or diarrhea. No sick contacts. No radiation of symptoms.    Past Medical History:  Diagnosis Date  . Depressed   . Fibroids   . GERD (gastroesophageal reflux disease)   . Hypertension   . Obesity     Patient Active Problem List   Diagnosis Date Noted  . Asthmatic bronchitis 09/03/2016  . SUI (stress urinary incontinence, female) 02/06/2016  . Dysmenorrhea 12/25/2015  . Abnormal uterine bleeding (AUB) 12/25/2015  . Perimenopausal 12/25/2015  . Metatarsal deformity 11/09/2014  . Pain, foot 02/07/2013  . Equinus deformity of foot, acquired 02/07/2013  . Tenosynovitis of foot and ankle 02/07/2013    Past Surgical History:  Procedure Laterality Date  . DILATION AND CURETTAGE OF UTERUS  09/2006  . TUBAL LIGATION      Current Outpatient Rx  . Order #: [id number] Class: Historical Med  . Order #: [id number] Class: Print  . Order #: [id number] Class: Print  . Order #: [id number] Class: Historical Med  . Order #: [id number] Class: Print  . Order #: [id number] Class: Historical Med  . Order #: [id number] Class: Print  . Order #: [id number] Class: Sample  . Order #: [id number] Class: Normal  . Order #: [id number] Class: Print  . Order #: [id number] Class: Historical Med  . Order #: [id number] Class: Historical Med  . Order #: [id number] Class: Historical Med  . Order #: [id number] Class: Print  . Order #: [id number] Class: Print  . Order #:  [id number] Class: Historical Med  . Order #: [id number] Class: Historical Med  . Order #: [id number] Class: Historical Med  . [START ON 01/11/2017] Order #: [id number] Class: Print    Allergies Darvocet [propoxyphene n-acetaminophen]; Darvon [propoxyphene hcl]; Doxycycline; Percocet [oxycodone-acetaminophen]; and Stadol [butorphanol]  Family History  Problem Relation Age of Onset  . Heart failure Mother     Social History Social History   Tobacco Use  . Smoking status: Never Smoker  . Smokeless tobacco: Never Used  Substance Use Topics  . Alcohol use: Yes    Alcohol/week: 0.0 oz    Comment: occasional-wine  . Drug use: No    Review of Systems  Constitutional: No fever/chills Eyes: No visual changes. ENT: No sore throat. Positive congestion and face pain.  Cardiovascular: Denies chest pain. Respiratory: Positive shortness of breath and cough.  Gastrointestinal: No abdominal pain.  No nausea, no vomiting.  No diarrhea.  No constipation. Genitourinary: Negative for dysuria. Musculoskeletal: Negative for back pain. Positive muscle aches.  Skin: Negative for rash. Neurological: Negative for headaches, focal weakness or numbness.  10-point ROS otherwise negative.  ____________________________________________   PHYSICAL EXAM:  VITAL SIGNS: ED Triage Vitals  Enc Vitals Group     BP 01/10/17 1705 (!) 154/66     Pulse Rate 01/10/17 1705 77     Resp 01/10/17 1705 16     Temp 01/10/17 1705 99.1 F (37.3 C)     Temp src --      SpO2 01/10/17 1705 99 %     Pain Score 01/10/17  1704 7   Constitutional: Alert and oriented. Well appearing and in no acute distress. Eyes: Conjunctivae are normal. Head: Atraumatic. Nose: Positive congestion/rhinnorhea. Mouth/Throat: Mucous membranes are moist.  Oropharynx with mild erythema. No exudate or PTA.  Neck: No stridor.   Cardiovascular: Normal rate, regular rhythm. Good peripheral circulation. Grossly normal heart sounds.   Respiratory:  Normal respiratory effort.  No retractions. Lungs with end-expiratory wheezing (mild).  Gastrointestinal: Soft and nontender. No distention.  Musculoskeletal: No lower extremity tenderness nor edema. No gross deformities of extremities. Neurologic:  Normal speech and language. No gross focal neurologic deficits are appreciated.  Skin:  Skin is warm, dry and intact. No rash noted.  ____________________________________________  NIOEVOJJK  Dg Chest 2 View  Result Date: 01/10/2017 CLINICAL DATA:  Cough and congestion for 2 weeks. EXAM: CHEST  2 VIEW COMPARISON:  08/27/2016 and prior radiograph FINDINGS: The cardiomediastinal silhouette is unremarkable. Mild peribronchial thickening is unchanged. There is no evidence of focal airspace disease, pulmonary edema, suspicious pulmonary nodule/mass, pleural effusion, or pneumothorax. No acute bony abnormalities are identified. IMPRESSION: No evidence of acute cardiopulmonary disease. Mild chronic peribronchial thickening. Electronically Signed   By: Margarette Canada M.D.   On: 01/10/2017 20:45    ____________________________________________   PROCEDURES  Procedure(s) performed:   Procedures  None ____________________________________________   INITIAL IMPRESSION / ASSESSMENT AND PLAN / ED COURSE  Pertinent labs & imaging results that were available during my care of the patient were reviewed by me and considered in my medical decision making (see chart for details).  Patient presents to the emergency department for evaluation of chest congestion, shortness of breath, cough, and face congestion.  Symptoms are most consistent with viral infection.  She does have some wheezing on exam.  Plan for nebulizer, steroid, chest x-ray to evaluate for pneumonia, and reassessment.   09:00 PM Patient is feeling better after DuoNeb.  Will start steroid and discharged home with Tessalon Perles for cough.  Albuterol provided at discharge.  Patient also provided a  "watch and wait" Rx for Augmentin to use as needed in 3 days if sinus pressure/symptoms continue to be severe.   At this time, I do not feel there is any life-threatening condition present. I have reviewed and discussed all results (EKG, imaging, lab, urine as appropriate), exam findings with patient. I have reviewed nursing notes and appropriate previous records.  I feel the patient is safe to be discharged home without further emergent workup. Discussed usual and customary return precautions. Patient and family (if present) verbalize understanding and are comfortable with this plan.  Patient will follow-up with their primary care provider. If they do not have a primary care provider, information for follow-up has been provided to them. All questions have been answered.  ____________________________________________  FINAL CLINICAL IMPRESSION(S) / ED DIAGNOSES  Final diagnoses:  Shortness of breath  Cough  Congestion of nasal sinus     MEDICATIONS GIVEN DURING THIS VISIT:  Medications  acetaminophen (TYLENOL) tablet 1,000 mg (not administered)  albuterol (PROVENTIL HFA;VENTOLIN HFA) 108 (90 Base) MCG/ACT inhaler 2 puff (not administered)  aerochamber plus with mask device 1 each (not administered)  predniSONE (DELTASONE) tablet 40 mg (not administered)  ipratropium-albuterol (DUONEB) 0.5-2.5 (3) MG/3ML nebulizer solution 3 mL (3 mLs Nebulization Given 01/10/17 2023)    Note:  This document was prepared using Dragon voice recognition software and may include unintentional dictation errors.  Nanda Quinton, MD Emergency Medicine    Cesiah Westley, [facility], MD 01/11/17 [phone]

## 2017-01-10 NOTE — ED Notes (Signed)
Patient transported to X-ray 

## 2017-01-10 NOTE — Discharge Instructions (Signed)
You have been seen in the Emergency Department (ED) today for a likely viral illness.  Please drink plenty of clear fluids (water, Gatorade, chicken broth, etc).  You may use Tylenol and/or Motrin according to label instructions.  You can alternate between the two without any side effects. Use the steroid, cough medication, and albuterol to treat your underlying asthma symptoms.   Please follow up with your doctor as listed above.  I am prescribing a "watch and wait" antibiotic prescription which you can start taking in 3 days if your face pain/congestion symptoms remain severe.   Call your doctor or return to the Emergency Department (ED) if you are unable to tolerate fluids due to vomiting, have worsening trouble breathing, become extremely tired or difficult to awaken, or if you develop any other symptoms that concern you.

## 2017-01-10 NOTE — ED Notes (Signed)
Pt would not keep breathing treatment in place. Pt was more concerned with talking on her phone to her daughter who was 2 doors down from her.

## 2017-01-27 ENCOUNTER — Ambulatory Visit (HOSPITAL_BASED_OUTPATIENT_CLINIC_OR_DEPARTMENT_OTHER): Payer: Medicare Other | Attending: Internal Medicine

## 2017-02-10 DIAGNOSIS — Z6841 Body Mass Index (BMI) 40.0 and over, adult: Secondary | ICD-10-CM | POA: Diagnosis not present

## 2017-02-10 DIAGNOSIS — R002 Palpitations: Secondary | ICD-10-CM | POA: Diagnosis not present

## 2017-02-10 DIAGNOSIS — R05 Cough: Secondary | ICD-10-CM | POA: Diagnosis not present

## 2017-02-10 DIAGNOSIS — F17290 Nicotine dependence, other tobacco product, uncomplicated: Secondary | ICD-10-CM | POA: Diagnosis not present

## 2017-02-10 DIAGNOSIS — I10 Essential (primary) hypertension: Secondary | ICD-10-CM | POA: Diagnosis not present

## 2017-02-10 DIAGNOSIS — K219 Gastro-esophageal reflux disease without esophagitis: Secondary | ICD-10-CM | POA: Diagnosis not present

## 2017-02-10 DIAGNOSIS — E785 Hyperlipidemia, unspecified: Secondary | ICD-10-CM | POA: Diagnosis not present

## 2017-02-10 DIAGNOSIS — F419 Anxiety disorder, unspecified: Secondary | ICD-10-CM | POA: Diagnosis not present

## 2017-03-19 DIAGNOSIS — J029 Acute pharyngitis, unspecified: Secondary | ICD-10-CM | POA: Diagnosis not present

## 2017-03-19 DIAGNOSIS — K219 Gastro-esophageal reflux disease without esophagitis: Secondary | ICD-10-CM | POA: Diagnosis not present

## 2017-03-19 DIAGNOSIS — E785 Hyperlipidemia, unspecified: Secondary | ICD-10-CM | POA: Diagnosis not present

## 2017-03-19 DIAGNOSIS — F419 Anxiety disorder, unspecified: Secondary | ICD-10-CM | POA: Diagnosis not present

## 2017-03-19 DIAGNOSIS — R002 Palpitations: Secondary | ICD-10-CM | POA: Diagnosis not present

## 2017-03-19 DIAGNOSIS — F17290 Nicotine dependence, other tobacco product, uncomplicated: Secondary | ICD-10-CM | POA: Diagnosis not present

## 2017-03-19 DIAGNOSIS — I10 Essential (primary) hypertension: Secondary | ICD-10-CM | POA: Diagnosis not present

## 2017-04-08 ENCOUNTER — Telehealth: Payer: Self-pay | Admitting: Cardiovascular Disease

## 2017-04-08 NOTE — Telephone Encounter (Signed)
Spoke with pt, aware we will have to ask and will call her back. She can not have a phone at work she ask that we leave a message at this number and she will check her messages.

## 2017-04-08 NOTE — Telephone Encounter (Signed)
New Message   Patient has an upcoming appointment on 04/09/2017 with Dr. Gwenlyn Found. 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 She is wondering can she still come to her appointment since its at 4:00pm and the last appointment of the day. Please call to discuss and a detailed message can be left. She just does not want to be turned around.

## 2017-04-09 ENCOUNTER — Ambulatory Visit: Payer: Medicare Other | Admitting: Cardiovascular Disease

## 2017-04-09 NOTE — Telephone Encounter (Signed)
LM for patient that if she is here by 4:15pm, per Lovena Le CMA, OK to still be seen.

## 2017-05-04 DIAGNOSIS — R002 Palpitations: Secondary | ICD-10-CM | POA: Diagnosis not present

## 2017-05-04 DIAGNOSIS — F419 Anxiety disorder, unspecified: Secondary | ICD-10-CM | POA: Diagnosis not present

## 2017-05-04 DIAGNOSIS — K219 Gastro-esophageal reflux disease without esophagitis: Secondary | ICD-10-CM | POA: Diagnosis not present

## 2017-05-04 DIAGNOSIS — E559 Vitamin D deficiency, unspecified: Secondary | ICD-10-CM | POA: Diagnosis not present

## 2017-05-04 DIAGNOSIS — F17290 Nicotine dependence, other tobacco product, uncomplicated: Secondary | ICD-10-CM | POA: Diagnosis not present

## 2017-05-04 DIAGNOSIS — E785 Hyperlipidemia, unspecified: Secondary | ICD-10-CM | POA: Diagnosis not present

## 2017-05-04 DIAGNOSIS — I10 Essential (primary) hypertension: Secondary | ICD-10-CM | POA: Diagnosis not present

## 2017-05-04 DIAGNOSIS — J029 Acute pharyngitis, unspecified: Secondary | ICD-10-CM | POA: Diagnosis not present

## 2017-05-09 ENCOUNTER — Encounter (HOSPITAL_COMMUNITY): Payer: Self-pay | Admitting: Family Medicine

## 2017-05-09 ENCOUNTER — Ambulatory Visit (HOSPITAL_COMMUNITY)
Admission: EM | Admit: 2017-05-09 | Discharge: 2017-05-09 | Disposition: A | Discharge status: Home / Self Care | Payer: Medicare Other | Attending: Internal Medicine | Admitting: Internal Medicine

## 2017-05-09 DIAGNOSIS — J3089 Other allergic rhinitis: Secondary | ICD-10-CM

## 2017-05-09 DIAGNOSIS — R070 Pain in throat: Secondary | ICD-10-CM

## 2017-05-09 DIAGNOSIS — J029 Acute pharyngitis, unspecified: Secondary | ICD-10-CM

## 2017-05-09 DIAGNOSIS — R1310 Dysphagia, unspecified: Secondary | ICD-10-CM

## 2017-05-09 DIAGNOSIS — J309 Allergic rhinitis, unspecified: Secondary | ICD-10-CM | POA: Diagnosis not present

## 2017-05-09 DIAGNOSIS — R0982 Postnasal drip: Secondary | ICD-10-CM | POA: Diagnosis not present

## 2017-05-09 LAB — POCT RAPID STREP A: STREPTOCOCCUS, GROUP A SCREEN (DIRECT): NEGATIVE

## 2017-05-09 MED ORDER — BUDESONIDE-FORMOTEROL FUMARATE 80-4.5 MCG/ACT IN AERO: 2.0000 | INHALATION_SPRAY | Freq: Every day | RESPIRATORY_TRACT | 1 refills | Status: AC

## 2017-05-09 MED ORDER — CETIRIZINE HCL 10 MG PO TABS: 10.0000 mg | ORAL_TABLET | Freq: Every day | ORAL | 11 refills | Status: DC

## 2017-05-09 MED ORDER — MONTELUKAST SODIUM 10 MG PO TABS: 10.0000 mg | ORAL_TABLET | Freq: Every day | ORAL | 3 refills | Status: DC

## 2017-05-09 MED ORDER — FLUTICASONE PROPIONATE 50 MCG/ACT NA SUSP: 1.0000 | NASAL | 11 refills | Status: DC | PRN

## 2017-05-09 NOTE — ED Triage Notes (Signed)
Pt here for sore throat since Wednesday. She is being treated with ceftin but unable to take due to reactions. sts it makes her feel weird. She is also having chest congestion.

## 2017-05-09 NOTE — ED Provider Notes (Signed)
MRN: [id number] DOB: [birth year]  Subjective:   Sarah Mathews is a 52 y.o. female presenting for 4 day history of sore throat. Has decreased appetite, malaise, hoarseness, chest tightness. Has used throat lozenges, cough drops. Has had persistent difficulty with cough, bronchitis. Had coughing fits earlier this week. She was seen by her PCP, was prescribed Ceftin but did not end up taking it out of concern for drug interactions after looking it up online. Denies fever, chest pain. Has a history of "asthma allergies", uses Proventil every 6 hours as needed. Denies smoking cigarettes.  No current facility-administered medications for this encounter.   Current Outpatient Medications:  .  acetaminophen (TYLENOL) 500 MG tablet, Take 500 mg by mouth as needed., Disp: , Rfl:  .  albuterol (PROVENTIL HFA;VENTOLIN HFA) 108 (90 Base) MCG/ACT inhaler, Inhale 2 puffs into the lungs every 6 (six) hours as needed for wheezing or shortness of breath., Disp: 1 Inhaler, Rfl: 0 .  albuterol (PROVENTIL HFA;VENTOLIN HFA) 108 (90 Base) MCG/ACT inhaler, Inhale 1-2 puffs into the lungs every 6 (six) hours as needed for wheezing or shortness of breath., Disp: 1 Inhaler, Rfl: 0 .  ALPRAZolam (XANAX) 0.25 MG tablet, Take 0.25 mg by mouth at bedtime as needed for anxiety., Disp: , Rfl:  .  aspirin EC 81 MG tablet, Take 81 mg by mouth daily., Disp: , Rfl:  .  benzonatate (TESSALON) 100 MG capsule, Take 1 capsule (100 mg total) by mouth every 8 (eight) hours., Disp: 21 capsule, Rfl: 0 .  budesonide-formoterol (SYMBICORT) 80-4.5 MCG/ACT inhaler, Inhale 2 puffs into the lungs 2 (two) times daily. (Patient not taking: Reported on 12/15/2016), Disp: 1 Inhaler, Rfl: 0 .  dextromethorphan-guaiFENesin (MUCINEX DM) 30-600 MG 12hr tablet, Take 1 tablet by mouth 2 (two) times daily. (Patient not taking: Reported on 12/15/2016), Disp: 40 tablet, Rfl: 0 .  fluticasone (FLONASE) 50 MCG/ACT nasal spray, Place 1 spray into both nostrils as  needed., Disp: , Rfl:  .  guaiFENesin (MUCINEX) 600 MG 12 hr tablet, Take 300 mg by mouth as needed for to loosen phlegm., Disp: , Rfl:  .  hydrochlorothiazide (HYDRODIURIL) 25 MG tablet, Take 12.5 mg by mouth daily., Disp: , Rfl:  .  HYDROcodone-homatropine (HYCODAN) 5-1.5 MG/5ML syrup, Take 5 mLs by mouth every 12 (twelve) hours as needed for cough. (Patient not taking: Reported on 12/15/2016), Disp: 25 mL, Rfl: 0 .  methylPREDNISolone (MEDROL DOSEPAK) 4 MG TBPK tablet, Take as directed (Patient not taking: Reported on 12/15/2016), Disp: 21 tablet, Rfl: 0 .  metoprolol succinate (TOPROL-XL) 25 MG 24 hr tablet, Take 25 mg by mouth daily., Disp: , Rfl: 3 .  nitroGLYCERIN (NITROSTAT) 0.3 MG SL tablet, Place 0.3 mg under the tongue every 5 (five) minutes as needed for chest pain., Disp: , Rfl:  .  omeprazole (PRILOSEC) 20 MG capsule, Take 20 mg daily by mouth., Disp: , Rfl:    Allergies  Allergen Reactions  . Darvocet [Propoxyphene N-Acetaminophen] Other (See Comments)    In high amount heart palpitations  . Darvon [Propoxyphene Hcl]   . Doxycycline Diarrhea  . Percocet [Oxycodone-Acetaminophen] Nausea And Vomiting  . Stadol [Butorphanol] Other (See Comments)    delierious    Past Medical History:  Diagnosis Date  . Depressed   . Fibroids   . GERD (gastroesophageal reflux disease)   . Hypertension   . Obesity      Past Surgical History:  Procedure Laterality Date  . DILATION AND CURETTAGE OF UTERUS  09/2006  .  TUBAL LIGATION      Objective:   Vitals: BP 131/65   Pulse 70   Temp 98.4 F (36.9 C)   Resp 18   SpO2 100%   Physical Exam  Constitutional: She is oriented to person, place, and time. She appears well-developed and well-nourished.  HENT:  Throat with moderate post-nasal drainage in cobblestone pattern.   Eyes: Right eye exhibits no discharge. Left eye exhibits no discharge. No scleral icterus.  Neck: Normal range of motion. Neck supple.  Cardiovascular: Normal  rate, regular rhythm and intact distal pulses. Exam reveals no gallop and no friction rub.  No murmur heard. Pulmonary/Chest: No respiratory distress. She has no wheezes. She has no rales.  Lymphadenopathy:    She has no cervical adenopathy.  Neurological: She is alert and oriented to person, place, and time.  Skin: Skin is warm and dry.  Psychiatric:  Patient was very irritable.    Results for orders placed or performed during the hospital encounter of 05/09/17 (from the past 24 hour(s))  POCT rapid strep A [facility])     Status: None   Collection Time: 05/09/17  3:28 PM  Result Value Ref Range   Streptococcus, Group A Screen (Direct) NEGATIVE NEGATIVE    Assessment and Plan :   Allergic rhinitis due to other allergic trigger, unspecified seasonality  Throat pain  Dysphagia, unspecified type  Post-nasal drainage  Patient has major concerns about side effects, drug interactions. She states that she refuses to take phenylephrine because it can raise her blood pressure but would like to have a steroid injections or prednisone pills. I discussed that these medications would potentially increase her blood pressure worse than phenylephrine AND raise her blood sugar. Patient is very frustrated that she continues to have throat, cough symptoms. I counseled that I believe her primary problem is allergic rhinitis given clear lung sounds and very frank post-nasal drainage. I counseled that she needs to be on Zyrtec daily, Singulair and schedule her albuterol inhaler until she gets better control of her bronchospasms which I explained are likely due to her allergic rhinitis. She wanted a refill of her Flonase and Symbicort because she did very well with both of these. Counseled patient on potential for adverse effects with medications prescribed today, patient verbalized understanding. Return-to-clinic precautions discussed, patient verbalized understanding.    Jaynee Eagles, [place] 05/09/17  [phone]

## 2017-05-09 NOTE — Discharge Instructions (Addendum)
Hydrate well with at least 2 liters (1 gallon) of water daily. For sore throat try using a honey-based tea. Use 3 teaspoons of honey with juice squeezed from half lemon. Place shaved pieces of ginger into 1/2-1 cup of water and warm over stove top. Then mix the ingredients and repeat every 4 hours as needed.  °

## 2017-05-10 ENCOUNTER — Ambulatory Visit (HOSPITAL_COMMUNITY)
Admission: EM | Admit: 2017-05-10 | Discharge: 2017-05-10 | Disposition: A | Discharge status: Home / Self Care | Payer: Medicare Other | Attending: Family Medicine | Admitting: Family Medicine

## 2017-05-10 ENCOUNTER — Telehealth (HOSPITAL_COMMUNITY): Payer: Self-pay

## 2017-05-10 ENCOUNTER — Other Ambulatory Visit: Payer: Self-pay

## 2017-05-10 ENCOUNTER — Encounter (HOSPITAL_COMMUNITY): Payer: Self-pay | Admitting: Emergency Medicine

## 2017-05-10 DIAGNOSIS — J029 Acute pharyngitis, unspecified: Secondary | ICD-10-CM | POA: Diagnosis not present

## 2017-05-10 MED ORDER — PENICILLIN G BENZATHINE 1200000 UNIT/2ML IM SUSP
INTRAMUSCULAR | Status: AC
  Filled 2017-05-10: qty 2

## 2017-05-10 MED ORDER — PENICILLIN G BENZATHINE 1200000 UNIT/2ML IM SUSP
1.2000 10*6.[IU] | Freq: Once | INTRAMUSCULAR | Status: AC
  Administered 2017-05-10: 1.2 10*6.[IU] via INTRAMUSCULAR

## 2017-05-10 MED ORDER — AMOXICILLIN 500 MG PO CAPS: 500.0000 mg | ORAL_CAPSULE | Freq: Three times a day (TID) | ORAL | 0 refills | Status: DC

## 2017-05-10 NOTE — ED Triage Notes (Addendum)
Here for follow up to throat complaint.

## 2017-05-10 NOTE — ED Provider Notes (Signed)
[place]    CSN: [id number] Arrival date & time: 05/10/17  1924     History   Chief Complaint Chief Complaint  Patient presents with  . Follow-up    HPI Sarah Mathews is a 52 y.o. female.   52 yo female here for continued sore throat. She is anxious and says she feels her throat is so sore and thinks it is yeast infection. Wants something to numb her throat. Wants antibiotic. Has tried salt water gargle and OTC meds and not helping.     Past Medical History:  Diagnosis Date  . Depressed   . Fibroids   . GERD (gastroesophageal reflux disease)   . Hypertension   . Obesity     Patient Active Problem List   Diagnosis Date Noted  . Asthmatic bronchitis 09/03/2016  . SUI (stress urinary incontinence, female) 02/06/2016  . Dysmenorrhea 12/25/2015  . Abnormal uterine bleeding (AUB) 12/25/2015  . Perimenopausal 12/25/2015  . Metatarsal deformity 11/09/2014  . Pain, foot 02/07/2013  . Equinus deformity of foot, acquired 02/07/2013  . Tenosynovitis of foot and ankle 02/07/2013    Past Surgical History:  Procedure Laterality Date  . DILATION AND CURETTAGE OF UTERUS  09/2006  . TUBAL LIGATION      OB History    Gravida  17   Para  7   Term  6   Preterm  1   AB  3   Living  7     SAB  2   TAB      Ectopic  1   Multiple  0   Live Births  7            Home Medications    Prior to Admission medications   Medication Sig Start Date End Date Taking? Authorizing Provider  acetaminophen (TYLENOL) 500 MG tablet Take 500 mg by mouth as needed.    [provider]  albuterol (PROVENTIL HFA;VENTOLIN HFA) 108 (90 Base) MCG/ACT inhaler Inhale 2 puffs into the lungs every 6 (six) hours as needed for wheezing or shortness of breath. 01/06/16   Katy Apo, NP  albuterol (PROVENTIL HFA;VENTOLIN HFA) 108 (90 Base) MCG/ACT inhaler Inhale 1-2 puffs into the lungs every 6 (six) hours as needed for wheezing or shortness of breath. 01/10/17    Long, Wonda Olds, MD  ALPRAZolam Duanne Moron) 0.25 MG tablet Take 0.25 mg by mouth at bedtime as needed for anxiety.    [provider]  amoxicillin (AMOXIL) 500 MG capsule Take 1 capsule (500 mg total) by mouth 3 (three) times daily. 05/10/17   Trevione Wert, DO  aspirin EC 81 MG tablet Take 81 mg by mouth daily.    [provider]  budesonide-formoterol (SYMBICORT) 80-4.5 MCG/ACT inhaler Inhale 2 puffs into the lungs daily. 05/09/17   Jaynee Eagles, PA-C  cetirizine (ZYRTEC ALLERGY) 10 MG tablet Take 1 tablet (10 mg total) by mouth daily. 05/09/17   Jaynee Eagles, PA-C  fluticasone (FLONASE) 50 MCG/ACT nasal spray Place 1 spray into both nostrils as needed. 05/09/17   Jaynee Eagles, PA-C  hydrochlorothiazide (HYDRODIURIL) 25 MG tablet Take 12.5 mg by mouth daily.    [provider]  metoprolol succinate (TOPROL-XL) 25 MG 24 hr tablet Take 25 mg by mouth daily. 08/04/16   [provider]  montelukast (SINGULAIR) 10 MG tablet Take 1 tablet (10 mg total) by mouth at bedtime. 05/09/17   Jaynee Eagles, PA-C  nitroGLYCERIN (NITROSTAT) 0.3 MG SL tablet Place 0.3 mg under  the tongue every 5 (five) minutes as needed for chest pain.    [provider]  omeprazole (PRILOSEC) 20 MG capsule Take 20 mg daily by mouth.    [provider]    Family History Family History  Problem Relation Age of Onset  . Heart failure Mother     Social History Social History   Tobacco Use  . Smoking status: Never Smoker  . Smokeless tobacco: Never Used  Substance Use Topics  . Alcohol use: Yes    Alcohol/week: 0.0 oz    Comment: occasional-wine  . Drug use: No     Allergies   Darvocet [propoxyphene n-acetaminophen]; Darvon [propoxyphene hcl]; Doxycycline; Percocet [oxycodone-acetaminophen]; and Stadol [butorphanol]   Review of Systems Review of Systems  Constitutional: Negative for activity change and appetite change.  HENT: Positive for sore throat. Negative for sneezing.     Eyes: Negative for discharge and itching.  Respiratory: Negative for apnea and chest tightness.      Physical Exam Triage Vital Signs ED Triage Vitals [05/10/17 2002]  Enc Vitals Group     BP 135/72     Pulse Rate 76     Resp 20     Temp 98.6 F (37 C)     Temp Source Oral     SpO2 100 %     Weight      Height      Head Circumference      Peak Flow      Pain Score      Pain Loc      Pain Edu?      Excl. in [place]?    No data found.  Updated Vital Signs BP 135/72 (BP Location: Right Arm)   Pulse 76   Temp 98.6 F (37 C) (Oral)   Resp 20   SpO2 100%   Visual Acuity Right Eye Distance:   Left Eye Distance:   Bilateral Distance:    Right Eye Near:   Left Eye Near:    Bilateral Near:     Physical Exam  Constitutional: She is oriented to person, place, and time. She appears well-developed and well-nourished.  HENT:  Head: Normocephalic and atraumatic.  Right Ear: External ear normal.  Left Ear: External ear normal.  Mouth/Throat: Oropharyngeal exudate present.  Eyes: Pupils are equal, round, and reactive to light. EOM are normal.  Neck: Normal range of motion. Neck supple.  Pulmonary/Chest: Effort normal. No respiratory distress.  Neurological: She is alert and oriented to person, place, and time.  Skin: Skin is warm and dry.     UC Treatments / Results  Labs (all labs ordered are listed, but only abnormal results are displayed) Labs Reviewed - No data to display  EKG None Radiology No results found.  Procedures Procedures (including critical care time)  Medications Ordered in UC Medications  penicillin g benzathine (BICILLIN LA) 1200000 UNIT/2ML injection 1.2 Million Units (has no administration in time range)     Initial Impression / Assessment and Plan / UC Course  I have reviewed the triage vital signs and the nursing notes.  Pertinent labs & imaging results that were available during my care of the patient were reviewed by me and  considered in my medical decision making (see chart for details).     1. Pharyngitis- rapid strep negative yesterday but patient insistent to get antibiotics. Will treat with penicillin and then amoxicillin  Final Clinical Impressions(s) / UC Diagnoses   Final diagnoses:  Acute pharyngitis, unspecified  etiology    ED Discharge Orders        Ordered    amoxicillin (AMOXIL) 500 MG capsule  3 times daily     05/10/17 2051       Controlled Substance Prescriptions [place] Controlled Substance Registry consulted? Not Applicable   Dannielle Huh, DO 05/10/17 2054

## 2017-05-12 LAB — CULTURE, GROUP A STREP (THRC)

## 2017-05-17 DIAGNOSIS — Z87891 Personal history of nicotine dependence: Secondary | ICD-10-CM | POA: Diagnosis not present

## 2017-05-17 DIAGNOSIS — E669 Obesity, unspecified: Secondary | ICD-10-CM | POA: Diagnosis not present

## 2017-05-17 DIAGNOSIS — J029 Acute pharyngitis, unspecified: Secondary | ICD-10-CM | POA: Diagnosis not present

## 2017-05-17 DIAGNOSIS — J309 Allergic rhinitis, unspecified: Secondary | ICD-10-CM | POA: Diagnosis not present

## 2017-05-17 DIAGNOSIS — B370 Candidal stomatitis: Secondary | ICD-10-CM | POA: Diagnosis not present

## 2017-06-21 ENCOUNTER — Encounter (HOSPITAL_COMMUNITY): Payer: Self-pay | Admitting: Family Medicine

## 2017-06-21 ENCOUNTER — Ambulatory Visit (HOSPITAL_COMMUNITY)
Admission: EM | Admit: 2017-06-21 | Discharge: 2017-06-21 | Disposition: A | Discharge status: Home / Self Care | Payer: Medicare Other | Attending: Family Medicine | Admitting: Family Medicine

## 2017-06-21 DIAGNOSIS — E669 Obesity, unspecified: Secondary | ICD-10-CM | POA: Insufficient documentation

## 2017-06-21 DIAGNOSIS — J45909 Unspecified asthma, uncomplicated: Secondary | ICD-10-CM | POA: Insufficient documentation

## 2017-06-21 DIAGNOSIS — N393 Stress incontinence (female) (male): Secondary | ICD-10-CM | POA: Diagnosis not present

## 2017-06-21 DIAGNOSIS — Z888 Allergy status to other drugs, medicaments and biological substances status: Secondary | ICD-10-CM | POA: Insufficient documentation

## 2017-06-21 DIAGNOSIS — Z885 Allergy status to narcotic agent status: Secondary | ICD-10-CM | POA: Insufficient documentation

## 2017-06-21 DIAGNOSIS — Z9851 Tubal ligation status: Secondary | ICD-10-CM | POA: Diagnosis not present

## 2017-06-21 DIAGNOSIS — K219 Gastro-esophageal reflux disease without esophagitis: Secondary | ICD-10-CM | POA: Insufficient documentation

## 2017-06-21 DIAGNOSIS — R1030 Lower abdominal pain, unspecified: Secondary | ICD-10-CM | POA: Insufficient documentation

## 2017-06-21 DIAGNOSIS — Z8249 Family history of ischemic heart disease and other diseases of the circulatory system: Secondary | ICD-10-CM | POA: Diagnosis not present

## 2017-06-21 DIAGNOSIS — Z791 Long term (current) use of non-steroidal anti-inflammatories (NSAID): Secondary | ICD-10-CM | POA: Diagnosis not present

## 2017-06-21 DIAGNOSIS — N946 Dysmenorrhea, unspecified: Secondary | ICD-10-CM | POA: Insufficient documentation

## 2017-06-21 DIAGNOSIS — N719 Inflammatory disease of uterus, unspecified: Secondary | ICD-10-CM | POA: Diagnosis not present

## 2017-06-21 DIAGNOSIS — Z7982 Long term (current) use of aspirin: Secondary | ICD-10-CM | POA: Diagnosis not present

## 2017-06-21 DIAGNOSIS — I10 Essential (primary) hypertension: Secondary | ICD-10-CM | POA: Diagnosis not present

## 2017-06-21 DIAGNOSIS — Z79899 Other long term (current) drug therapy: Secondary | ICD-10-CM | POA: Insufficient documentation

## 2017-06-21 DIAGNOSIS — Z9889 Other specified postprocedural states: Secondary | ICD-10-CM | POA: Diagnosis not present

## 2017-06-21 DIAGNOSIS — N939 Abnormal uterine and vaginal bleeding, unspecified: Secondary | ICD-10-CM | POA: Insufficient documentation

## 2017-06-21 MED ORDER — IBUPROFEN 800 MG PO TABS: 800.0000 mg | ORAL_TABLET | Freq: Three times a day (TID) | ORAL | 0 refills | Status: DC

## 2017-06-21 MED ORDER — DOXYCYCLINE HYCLATE 100 MG PO CAPS: 100.0000 mg | ORAL_CAPSULE | Freq: Two times a day (BID) | ORAL | 0 refills | Status: DC

## 2017-06-21 NOTE — ED Provider Notes (Signed)
[place]    CSN: [id number] Arrival date & time: 06/21/17  1433     History   Chief Complaint Chief Complaint  Patient presents with  . Abdominal Pain    HPI Sarah Mathews is a 52 y.o. female.   HPI  Patient is here menstrual irregularities and abdominal pain.  She states she is having a period about every other month.  She states is about time for her.  Now she is having terrible cramping down in her uterus area that feels like "labor" or "bad cramps" but no bleeding.  She has no urinary symptoms, frequency, hematuria, dysuria.  She has no flank pain, nausea, vomiting.  No fever or chills.  She denies any vaginal discharge or odor.  She does not think she is exposed to an STD, has not had any sexual relations in 6 months.  She does not use condoms.  No nausea vomiting diarrhea, no gastroenteritis symptoms.  She is 52 has not had a colonoscopy.  She states never been mentioned to her.  I told her to address this with her PCP.  She does have regular GYN checks.  She states her Pap smear was normal.  Past Medical History:  Diagnosis Date  . Depressed   . Fibroids   . GERD (gastroesophageal reflux disease)   . Hypertension   . Obesity     Patient Active Problem List   Diagnosis Date Noted  . Asthmatic bronchitis 09/03/2016  . SUI (stress urinary incontinence, female) 02/06/2016  . Dysmenorrhea 12/25/2015  . Abnormal uterine bleeding (AUB) 12/25/2015  . Perimenopausal 12/25/2015  . Metatarsal deformity 11/09/2014  . Pain, foot 02/07/2013  . Equinus deformity of foot, acquired 02/07/2013  . Tenosynovitis of foot and ankle 02/07/2013    Past Surgical History:  Procedure Laterality Date  . DILATION AND CURETTAGE OF UTERUS  09/2006  . TUBAL LIGATION      OB History    Gravida  17   Para  7   Term  6   Preterm  1   AB  3   Living  7     SAB  2   TAB      Ectopic  1   Multiple  0   Live Births  7            Home Medications    Prior  to Admission medications   Medication Sig Start Date End Date Taking? Authorizing Provider  acetaminophen (TYLENOL) 500 MG tablet Take 500 mg by mouth as needed.    [provider]  albuterol (PROVENTIL HFA;VENTOLIN HFA) 108 (90 Base) MCG/ACT inhaler Inhale 2 puffs into the lungs every 6 (six) hours as needed for wheezing or shortness of breath. 01/06/16   Katy Apo, NP  ALPRAZolam Duanne Moron) 0.25 MG tablet Take 0.25 mg by mouth at bedtime as needed for anxiety.    [provider]  aspirin EC 81 MG tablet Take 81 mg by mouth daily.    [provider]  budesonide-formoterol (SYMBICORT) 80-4.5 MCG/ACT inhaler Inhale 2 puffs into the lungs daily. 05/09/17   Jaynee Eagles, PA-C  cetirizine (ZYRTEC ALLERGY) 10 MG tablet Take 1 tablet (10 mg total) by mouth daily. 05/09/17   Jaynee Eagles, PA-C  doxycycline (VIBRAMYCIN) 100 MG capsule Take 1 capsule (100 mg total) by mouth 2 (two) times daily. 06/21/17   Raylene Everts, MD  fluticasone [facility]) 50 MCG/ACT nasal spray Place 1 spray into both nostrils as needed. 05/09/17  Jaynee Eagles, PA-C  hydrochlorothiazide (HYDRODIURIL) 25 MG tablet Take 12.5 mg by mouth daily.    [provider]  ibuprofen (ADVIL,MOTRIN) 800 MG tablet Take 1 tablet (800 mg total) by mouth 3 (three) times daily. 06/21/17   Raylene Everts, MD  metoprolol succinate (TOPROL-XL) 25 MG 24 hr tablet Take 25 mg by mouth daily. 08/04/16   [provider]  montelukast (SINGULAIR) 10 MG tablet Take 1 tablet (10 mg total) by mouth at bedtime. 05/09/17   Jaynee Eagles, PA-C  omeprazole (PRILOSEC) 20 MG capsule Take 20 mg daily by mouth.    [provider]    Family History Family History  Problem Relation Age of Onset  . Heart failure Mother     Social History Social History   Tobacco Use  . Smoking status: Never Smoker  . Smokeless tobacco: Never Used  Substance Use Topics  . Alcohol use: Yes    Alcohol/week: 0.0 oz    Comment:  occasional-wine  . Drug use: No     Allergies   Darvocet [propoxyphene n-acetaminophen]; Darvon [propoxyphene hcl]; Percocet [oxycodone-acetaminophen]; and Stadol [butorphanol]   Review of Systems Review of Systems  Constitutional: Negative for appetite change, chills, fatigue and fever.  HENT: Negative for congestion.   Eyes: Negative for pain and visual disturbance.  Respiratory: Negative for cough and shortness of breath.   Cardiovascular: Negative for chest pain, palpitations and leg swelling.  Gastrointestinal: Positive for abdominal pain. Negative for abdominal distention, constipation, diarrhea, nausea and vomiting.  Genitourinary: Positive for menstrual problem and pelvic pain. Negative for dysuria, frequency, hematuria and vaginal discharge.  Musculoskeletal: Positive for back pain. Negative for arthralgias.  Skin: Negative for color change and rash.  Neurological: Negative for seizures and headaches.  All other systems reviewed and are negative.    Physical Exam Triage Vital Signs ED Triage Vitals  Enc Vitals Group     BP 06/21/17 1502 137/61     Pulse Rate 06/21/17 1501 68     Resp 06/21/17 1501 18     Temp 06/21/17 1501 98.4 F (36.9 C)     Temp src --      SpO2 06/21/17 1501 98 %     Weight --      Height --      Head Circumference --      Peak Flow --      Pain Score 06/21/17 1458 8     Pain Loc --      Pain Edu? --      Excl. in [place]? --    No data found.  Updated Vital Signs BP 137/61   Pulse 68   Temp 98.4 F (36.9 C)   Resp 18   SpO2 98%      Physical Exam  Constitutional: She appears well-developed and well-nourished. She appears ill. No distress.  Appears uncomfortable  HENT:  Head: Normocephalic and atraumatic.  Mouth/Throat: Oropharynx is clear and moist.  Eyes: Conjunctivae are normal.  Neck: Neck supple.  Cardiovascular: Normal rate and regular rhythm.  No murmur heard. Pulmonary/Chest: Effort normal and breath sounds normal. No  respiratory distress.  Abdominal: Soft. Normal appearance and bowel sounds are normal. There is no hepatosplenomegaly. There is tenderness in the suprapubic area. There is no CVA tenderness.  Abdomen obese, pannus  Genitourinary: Uterus is tender. Cervix exhibits motion tenderness. Cervix exhibits no friability. Right adnexum displays no tenderness and no fullness. Left adnexum displays no tenderness and no fullness. Vaginal discharge found.  Genitourinary Comments: Malodorous vaginal discharge, consistent with BV.  Cervix parous, intact, copious light yellow discharge  Musculoskeletal: She exhibits no edema.  Neurological: She is alert.  Skin: Skin is warm and dry.  Psychiatric: She has a normal mood and affect.  Nursing note and vitals reviewed.    UC Treatments / Results  Labs (all labs ordered are listed, but only abnormal results are displayed) Labs Reviewed  CERVICOVAGINAL ANCILLARY ONLY    EKG None  Radiology No results found.  Procedures Procedures (including critical care time)  Medications Ordered in UC Medications - No data to display  Initial Impression / Assessment and Plan / UC Course  I have reviewed the triage vital signs and the nursing notes.  Pertinent labs & imaging results that were available during my care of the patient were reviewed by me and considered in my medical decision making (see chart for details).      Final Clinical Impressions(s) / UC Diagnoses   Final diagnoses:  Endometritis  Lower abdominal pain     Discharge Instructions     Take the doxycycline twice a day with food Also take a probiotic to prevent diarrhea Take the ibuprofen three times a day with food This is for pain You should see improvement in a couple of days Return as needed    ED Prescriptions    Medication Sig Dispense Auth. Provider   doxycycline (VIBRAMYCIN) 100 MG capsule Take 1 capsule (100 mg total) by mouth 2 (two) times daily. 20 capsule Raylene Everts, MD   ibuprofen (ADVIL,MOTRIN) 800 MG tablet Take 1 tablet (800 mg total) by mouth 3 (three) times daily. 21 tablet Raylene Everts, MD     Controlled Substance Prescriptions [place] Controlled Substance Registry consulted? Not Applicable   Raylene Everts, MD 06/21/17 2054

## 2017-06-21 NOTE — Discharge Instructions (Addendum)
Take the doxycycline twice a day with food Also take a probiotic to prevent diarrhea Take the ibuprofen three times a day with food This is for pain You should see improvement in a couple of days Return as needed

## 2017-06-21 NOTE — ED Triage Notes (Addendum)
Pt here for lower abd cramping radiating into her back x 1 week. Denies any vaginal discharge and bleeding. sts that she is taking tylenol and its not helping. Pt eating and drinking.

## 2017-06-22 ENCOUNTER — Telehealth (HOSPITAL_COMMUNITY): Payer: Self-pay

## 2017-06-22 LAB — CERVICOVAGINAL ANCILLARY ONLY
BACTERIAL VAGINITIS: NEGATIVE
CANDIDA VAGINITIS: NEGATIVE
Chlamydia: NEGATIVE
Neisseria Gonorrhea: NEGATIVE
TRICH (WINDOWPATH): NEGATIVE

## 2017-06-22 NOTE — Telephone Encounter (Signed)
Attempted to contact patient regarding normal results. No answer at this time.

## 2017-06-23 ENCOUNTER — Other Ambulatory Visit: Payer: Self-pay | Admitting: Sports Medicine

## 2017-06-23 DIAGNOSIS — M545 Low back pain, unspecified: Secondary | ICD-10-CM

## 2017-06-23 DIAGNOSIS — M1712 Unilateral primary osteoarthritis, left knee: Secondary | ICD-10-CM | POA: Diagnosis not present

## 2017-07-01 ENCOUNTER — Other Ambulatory Visit: Payer: Medicare Other

## 2017-07-03 ENCOUNTER — Other Ambulatory Visit: Payer: Self-pay | Admitting: Urgent Care

## 2017-07-16 DIAGNOSIS — R102 Pelvic and perineal pain: Secondary | ICD-10-CM | POA: Diagnosis not present

## 2017-07-16 DIAGNOSIS — G8929 Other chronic pain: Secondary | ICD-10-CM | POA: Diagnosis not present

## 2017-07-16 DIAGNOSIS — F419 Anxiety disorder, unspecified: Secondary | ICD-10-CM | POA: Diagnosis not present

## 2017-07-16 DIAGNOSIS — R232 Flushing: Secondary | ICD-10-CM | POA: Diagnosis not present

## 2017-07-16 DIAGNOSIS — J4520 Mild intermittent asthma, uncomplicated: Secondary | ICD-10-CM | POA: Diagnosis not present

## 2017-07-16 DIAGNOSIS — R002 Palpitations: Secondary | ICD-10-CM | POA: Diagnosis not present

## 2017-08-17 DIAGNOSIS — Z1231 Encounter for screening mammogram for malignant neoplasm of breast: Secondary | ICD-10-CM | POA: Diagnosis not present

## 2017-08-17 DIAGNOSIS — J4520 Mild intermittent asthma, uncomplicated: Secondary | ICD-10-CM | POA: Diagnosis not present

## 2017-08-17 DIAGNOSIS — G8929 Other chronic pain: Secondary | ICD-10-CM | POA: Diagnosis not present

## 2017-08-17 DIAGNOSIS — R102 Pelvic and perineal pain: Secondary | ICD-10-CM | POA: Diagnosis not present

## 2017-08-17 DIAGNOSIS — F419 Anxiety disorder, unspecified: Secondary | ICD-10-CM | POA: Diagnosis not present

## 2017-08-17 DIAGNOSIS — R232 Flushing: Secondary | ICD-10-CM | POA: Diagnosis not present

## 2017-08-25 DIAGNOSIS — J45909 Unspecified asthma, uncomplicated: Secondary | ICD-10-CM | POA: Diagnosis not present

## 2017-08-30 DIAGNOSIS — R0683 Snoring: Secondary | ICD-10-CM | POA: Diagnosis not present

## 2017-08-30 DIAGNOSIS — R0602 Shortness of breath: Secondary | ICD-10-CM | POA: Diagnosis not present

## 2017-08-30 DIAGNOSIS — J309 Allergic rhinitis, unspecified: Secondary | ICD-10-CM | POA: Diagnosis not present

## 2017-08-30 DIAGNOSIS — G4719 Other hypersomnia: Secondary | ICD-10-CM | POA: Diagnosis not present

## 2017-11-05 DIAGNOSIS — K219 Gastro-esophageal reflux disease without esophagitis: Secondary | ICD-10-CM | POA: Diagnosis not present

## 2017-11-05 DIAGNOSIS — F17290 Nicotine dependence, other tobacco product, uncomplicated: Secondary | ICD-10-CM | POA: Diagnosis not present

## 2017-11-05 DIAGNOSIS — G44209 Tension-type headache, unspecified, not intractable: Secondary | ICD-10-CM | POA: Diagnosis not present

## 2017-11-05 DIAGNOSIS — E559 Vitamin D deficiency, unspecified: Secondary | ICD-10-CM | POA: Diagnosis not present

## 2017-11-05 DIAGNOSIS — F419 Anxiety disorder, unspecified: Secondary | ICD-10-CM | POA: Diagnosis not present

## 2017-11-05 DIAGNOSIS — I10 Essential (primary) hypertension: Secondary | ICD-10-CM | POA: Diagnosis not present

## 2017-11-05 DIAGNOSIS — E785 Hyperlipidemia, unspecified: Secondary | ICD-10-CM | POA: Diagnosis not present

## 2017-12-04 ENCOUNTER — Other Ambulatory Visit: Payer: Self-pay | Admitting: Urgent Care

## 2017-12-04 NOTE — Telephone Encounter (Signed)
Message sent to wrong practice.  Sarah Mathews has seen pt at [facility] forwarded to [facility]

## 2018-02-06 ENCOUNTER — Other Ambulatory Visit: Payer: Self-pay

## 2018-02-06 ENCOUNTER — Emergency Department (HOSPITAL_COMMUNITY)
Admission: EM | Admit: 2018-02-06 | Discharge: 2018-02-06 | Disposition: A | Discharge status: Home / Self Care | Payer: Medicare Other | Attending: Emergency Medicine | Admitting: Emergency Medicine

## 2018-02-06 ENCOUNTER — Encounter (HOSPITAL_COMMUNITY): Payer: Self-pay | Admitting: Emergency Medicine

## 2018-02-06 DIAGNOSIS — Z79899 Other long term (current) drug therapy: Secondary | ICD-10-CM | POA: Diagnosis not present

## 2018-02-06 DIAGNOSIS — R0789 Other chest pain: Secondary | ICD-10-CM | POA: Diagnosis not present

## 2018-02-06 DIAGNOSIS — Z7982 Long term (current) use of aspirin: Secondary | ICD-10-CM | POA: Insufficient documentation

## 2018-02-06 DIAGNOSIS — J40 Bronchitis, not specified as acute or chronic: Secondary | ICD-10-CM | POA: Insufficient documentation

## 2018-02-06 DIAGNOSIS — I10 Essential (primary) hypertension: Secondary | ICD-10-CM | POA: Diagnosis not present

## 2018-02-06 DIAGNOSIS — R05 Cough: Secondary | ICD-10-CM | POA: Diagnosis present

## 2018-02-06 MED ORDER — HYDROCODONE-HOMATROPINE 5-1.5 MG/5ML PO SYRP: 5.0000 mL | ORAL_SOLUTION | Freq: Four times a day (QID) | ORAL | 0 refills | Status: DC | PRN

## 2018-02-06 MED ORDER — AZITHROMYCIN 250 MG PO TABS: 250.0000 mg | ORAL_TABLET | Freq: Every day | ORAL | 0 refills | Status: DC

## 2018-02-06 MED ORDER — PREDNISONE 10 MG PO TABS: 20.0000 mg | ORAL_TABLET | Freq: Every day | ORAL | 0 refills | Status: AC

## 2018-02-06 MED ORDER — ALBUTEROL SULFATE (2.5 MG/3ML) 0.083% IN NEBU
5.0000 mg | INHALATION_SOLUTION | Freq: Once | RESPIRATORY_TRACT | Status: AC
Start: 2018-02-06 — End: 2018-02-06
  Administered 2018-02-06: 5 mg via RESPIRATORY_TRACT
  Filled 2018-02-06: qty 6

## 2018-02-06 MED ORDER — PREDNISONE 20 MG PO TABS
60.0000 mg | ORAL_TABLET | Freq: Once | ORAL | Status: AC
  Administered 2018-02-06: 60 mg via ORAL
  Filled 2018-02-06: qty 3

## 2018-02-06 MED ORDER — ALBUTEROL SULFATE HFA 108 (90 BASE) MCG/ACT IN AERS
2.0000 | INHALATION_SPRAY | Freq: Once | RESPIRATORY_TRACT | Status: AC
  Administered 2018-02-06: 2 via RESPIRATORY_TRACT
  Filled 2018-02-06: qty 6.7

## 2018-02-06 NOTE — Discharge Instructions (Addendum)
Please read instructions below.  You can take tylenol as needed for sore throat or fever.  Drink plenty of water.  Use saline nasal spray for congestion. You can take Hycodan cough syrup every 6 hours as needed for cough.  Be aware this medication can make you drowsy, do not drive or drink alcohol while taking this. Tomorrow, take prednisone daily as prescribed. If your symptoms persist past 5 days without improvement, you can begin taking the antibiotic.  It is important you follow-up with your primary care provider. You can use the albuterol inhaler every 6 hours as needed for chest tightness. Follow up with your primary care provider as needed.  Return to the ER for inability to swallow liquids, difficulty breathing, or new or worsening symptoms.

## 2018-02-06 NOTE — ED Notes (Signed)
Pt stable, ambulatory, states understanding of discharge instructions 

## 2018-02-06 NOTE — ED Provider Notes (Addendum)
[facility] EMERGENCY DEPARTMENT Provider Note   CSN: [id number] Arrival date & time: 02/06/18  1324     History   Chief Complaint Chief Complaint  Patient presents with  . Cough  . Nasal Congestion    HPI Sarah Mathews is a 52 y.o. female with past medical history of hypertension, GERD, bronchitis, presenting to the emergency department with complaint of 1 week of nasal congestion.  She states yesterday she began developing a cough that is mostly productive of clear sputum, however intermittently productive of yellow sputum.  She has associated chest tightness, similar to how she felt with prior episode of bronchitis.  States her throat is slightly sore.  Been treating symptoms with Mucinex.  No associated fever, difficulty breathing or swallowing.  Patient requesting albuterol treatment as this provided her with relief of her bronchitis last year.  The history is provided by the patient.    Past Medical History:  Diagnosis Date  . Depressed   . Fibroids   . GERD (gastroesophageal reflux disease)   . Hypertension   . Obesity     Patient Active Problem List   Diagnosis Date Noted  . Asthmatic bronchitis 09/03/2016  . SUI (stress urinary incontinence, female) 02/06/2016  . Dysmenorrhea 12/25/2015  . Abnormal uterine bleeding (AUB) 12/25/2015  . Perimenopausal 12/25/2015  . Metatarsal deformity 11/09/2014  . Pain, foot 02/07/2013  . Equinus deformity of foot, acquired 02/07/2013  . Tenosynovitis of foot and ankle 02/07/2013    Past Surgical History:  Procedure Laterality Date  . DILATION AND CURETTAGE OF UTERUS  09/2006  . TUBAL LIGATION       OB History    Gravida  17   Para  7   Term  6   Preterm  1   AB  3   Living  7     SAB  2   TAB      Ectopic  1   Multiple  0   Live Births  7            Home Medications    Prior to Admission medications   Medication Sig Start Date End Date Taking? Authorizing Provider    acetaminophen (TYLENOL) 500 MG tablet Take 500 mg by mouth as needed.    [provider]  albuterol (PROVENTIL HFA;VENTOLIN HFA) 108 (90 Base) MCG/ACT inhaler Inhale 2 puffs into the lungs every 6 (six) hours as needed for wheezing or shortness of breath. 01/06/16   Katy Apo, NP  ALPRAZolam Duanne Moron) 0.25 MG tablet Take 0.25 mg by mouth at bedtime as needed for anxiety.    [provider]  aspirin EC 81 MG tablet Take 81 mg by mouth daily.    [provider]  azithromycin (ZITHROMAX) 250 MG tablet Take 1 tablet (250 mg total) by mouth daily. Take first 2 tablets together, then 1 every day until finished. 02/06/18   Robinson, [place] N, PA-C  budesonide-formoterol (SYMBICORT) 80-4.5 MCG/ACT inhaler Inhale 2 puffs into the lungs daily. 05/09/17   Jaynee Eagles, PA-C  cetirizine (ZYRTEC ALLERGY) 10 MG tablet Take 1 tablet (10 mg total) by mouth daily. 05/09/17   Jaynee Eagles, PA-C  doxycycline (VIBRAMYCIN) 100 MG capsule Take 1 capsule (100 mg total) by mouth 2 (two) times daily. 06/21/17   Raylene Everts, MD  fluticasone [facility]) 50 MCG/ACT nasal spray Place 1 spray into both nostrils as needed. 05/09/17   Jaynee Eagles, PA-C  hydrochlorothiazide (HYDRODIURIL) 25 MG tablet Take  12.5 mg by mouth daily.    [provider]  HYDROcodone-homatropine (HYCODAN) 5-1.5 MG/5ML syrup Take 5 mLs by mouth every 6 (six) hours as needed for cough. 02/06/18   Robinson, [place] N, PA-C  ibuprofen (ADVIL,MOTRIN) 800 MG tablet Take 1 tablet (800 mg total) by mouth 3 (three) times daily. 06/21/17   Raylene Everts, MD  metoprolol succinate (TOPROL-XL) 25 MG 24 hr tablet Take 25 mg by mouth daily. 08/04/16   [provider]  montelukast (SINGULAIR) 10 MG tablet Take 1 tablet (10 mg total) by mouth at bedtime. 05/09/17   Jaynee Eagles, PA-C  omeprazole (PRILOSEC) 20 MG capsule Take 20 mg daily by mouth.    [provider]  predniSONE (DELTASONE) 10 MG tablet Take 2  tablets (20 mg total) by mouth daily for 4 days. 02/07/18 02/11/18  Robinson, [place] N, PA-C    Family History Family History  Problem Relation Age of Onset  . Heart failure Mother     Social History Social History   Tobacco Use  . Smoking status: Never Smoker  . Smokeless tobacco: Never Used  Substance Use Topics  . Alcohol use: Yes    Alcohol/week: 0.0 standard drinks    Comment: occasional-wine  . Drug use: No     Allergies   Darvocet [propoxyphene n-acetaminophen]; Darvon [propoxyphene hcl]; Percocet [oxycodone-acetaminophen]; and Stadol [butorphanol]   Review of Systems Review of Systems  Constitutional: Negative for fever.  HENT: Positive for congestion and sore throat. Negative for trouble swallowing and voice change.   Respiratory: Positive for cough and chest tightness. Negative for shortness of breath.      Physical Exam Updated Vital Signs BP (!) 141/81   Pulse 80   Temp 98.6 F (37 C) (Oral)   Resp 20   Ht 5\' 4"  (1.626 m)   Wt 130 kg   SpO2 100%   BMI 49.19 kg/m   Physical Exam Vitals signs and nursing note reviewed.  Constitutional:      General: She is not in acute distress.    Appearance: She is well-developed. She is not ill-appearing.  HENT:     Head: Normocephalic and atraumatic.     Right Ear: Tympanic membrane and ear canal normal.     Left Ear: Tympanic membrane and ear canal normal.     Mouth/Throat:     Mouth: Mucous membranes are moist.     Pharynx: Oropharynx is clear. No oropharyngeal exudate or posterior oropharyngeal erythema.  Eyes:     Conjunctiva/sclera: Conjunctivae normal.  Neck:     Musculoskeletal: Normal range of motion.  Cardiovascular:     Rate and Rhythm: Normal rate and regular rhythm.  Pulmonary:     Effort: Pulmonary effort is normal. No respiratory distress.     Breath sounds: Normal breath sounds.     Comments: Normal work of breathing. Lymphadenopathy:     Cervical: No cervical adenopathy.   Neurological:     Mental Status: She is alert.  Psychiatric:        Mood and Affect: Mood normal.        Behavior: Behavior normal.      ED Treatments / Results  Labs (all labs ordered are listed, but only abnormal results are displayed) Labs Reviewed - No data to display  EKG None  Radiology No results found.  Procedures Procedures (including critical care time)  Medications Ordered in ED Medications  albuterol (PROVENTIL HFA;VENTOLIN HFA) 108 (90 Base) MCG/ACT inhaler 2 puff (has no  administration in time range)  predniSONE (DELTASONE) tablet 60 mg (has no administration in time range)  albuterol (PROVENTIL) (2.5 MG/3ML) 0.083% nebulizer solution 5 mg (5 mg Nebulization Given 02/06/18 1416)     Initial Impression / Assessment and Plan / ED Course  I have reviewed the triage vital signs and the nursing notes.  Pertinent labs & imaging results that were available during my care of the patient were reviewed by me and considered in my medical decision making (see chart for details).  Clinical Course as of Feb 06 1502  Sun Feb 06, 2018  1401 Patient requesting breathing treatment, states this made her feel better last year with similar symptoms.   [JR]  3810 Patient reevaluated, reports significant improvement in chest tightness.  States she does not feel like she needs a chest x-ray and was requesting discharge with similar treatment as she had last year.   [JR]    Clinical Course User Index [JR] Robinson, [place] N, PA-C    Patient presenting with 1 week of nasal congestion, and intermittently productive cough that began yesterday.  Associated chest tightness.  On exam, she is well-appearing, afebrile, normal work of breathing.  Oxygenating 100% on room air.  Lungs are clear to auscultation bilaterally.  ENT exam is unremarkable.  Patient requesting breathing treatment as this provided her with relief with previous episode of bronchitis, states this feels the same.   Nebulizer treatment ordered.  Chest x-ray to rule out pneumonia.  3:03 PM Patient reevaluated, reports significant provement after albuterol treatment.  States she does not feel like she needs an x-ray.  States this feels very similar to her history of asthmatic bronchitis.  Will treat with prednisone and albuterol.  Will provide prescription for azithromycin, however instructed her to wait a few days and if symptoms persist or worsen to begin taking it.  She verbalized understanding and agrees with this plan.  Patient has PCP follow-up this week.  [place] Controlled Substance reporting System queried  Discussed results, findings, treatment and follow up. Patient advised of return precautions. Patient verbalized understanding and agreed with plan.  Final Clinical Impressions(s) / ED Diagnoses   Final diagnoses:  Bronchitis    ED Discharge Orders         Ordered    predniSONE (DELTASONE) 10 MG tablet  Daily     02/06/18 1502    HYDROcodone-homatropine (HYCODAN) 5-1.5 MG/5ML syrup  Every 6 hours PRN     02/06/18 1502    azithromycin (ZITHROMAX) 250 MG tablet  Daily     02/06/18 1502           Robinson, [place] N, PA-C 02/06/18 1439    Robinson, [place] N, PA-C 02/06/18 1505    Blanchie Dessert, MD 02/06/18 1918

## 2018-02-06 NOTE — ED Triage Notes (Signed)
Onset one day ago developed productive cough green and yellow sputum with nasal congestion one week ago.

## 2018-02-09 ENCOUNTER — Emergency Department (HOSPITAL_COMMUNITY): Payer: Medicare Other

## 2018-02-09 ENCOUNTER — Emergency Department (HOSPITAL_COMMUNITY)
Admission: EM | Admit: 2018-02-09 | Discharge: 2018-02-09 | Disposition: A | Discharge status: Home / Self Care | Payer: Medicare Other | Attending: Emergency Medicine | Admitting: Emergency Medicine

## 2018-02-09 ENCOUNTER — Encounter (HOSPITAL_COMMUNITY): Payer: Self-pay | Admitting: Emergency Medicine

## 2018-02-09 DIAGNOSIS — R05 Cough: Secondary | ICD-10-CM | POA: Diagnosis not present

## 2018-02-09 DIAGNOSIS — Z7982 Long term (current) use of aspirin: Secondary | ICD-10-CM | POA: Diagnosis not present

## 2018-02-09 DIAGNOSIS — I10 Essential (primary) hypertension: Secondary | ICD-10-CM | POA: Diagnosis not present

## 2018-02-09 DIAGNOSIS — J069 Acute upper respiratory infection, unspecified: Secondary | ICD-10-CM | POA: Insufficient documentation

## 2018-02-09 DIAGNOSIS — Z79899 Other long term (current) drug therapy: Secondary | ICD-10-CM | POA: Diagnosis not present

## 2018-02-09 MED ORDER — IPRATROPIUM-ALBUTEROL 0.5-2.5 (3) MG/3ML IN SOLN
3.0000 mL | Freq: Once | RESPIRATORY_TRACT | Status: AC
  Administered 2018-02-09: 3 mL via RESPIRATORY_TRACT
  Filled 2018-02-09: qty 3

## 2018-02-09 NOTE — ED Notes (Signed)
Pt states that she feels better since breathing treatment

## 2018-02-09 NOTE — Discharge Instructions (Addendum)
Please read attached information. If you experience any new or worsening signs or symptoms please return to the emergency room for evaluation. Please follow-up with your primary care provider or specialist as discussed. Please use medication prescribed only as directed and discontinue taking if you have any concerning signs or symptoms.   °

## 2018-02-09 NOTE — ED Triage Notes (Signed)
Pt states she was seen here Sunday for runny nose and congestion, was given cough meds and zpak, also taking mucinex. Pt states she feels like cough is getting better but now she feels achy.

## 2018-02-09 NOTE — ED Notes (Signed)
Patient transported to X-ray 

## 2018-02-09 NOTE — ED Provider Notes (Signed)
[facility] EMERGENCY DEPARTMENT Provider Note   CSN: [id number] Arrival date & time: 02/09/18  1324    History   Chief Complaint Chief Complaint  Patient presents with  . Cough  . Generalized Body Aches    HPI Sarah Mathews is a 53 y.o. female.  HPI   53 year old female presents today with complaints of upper respiratory infection.  Patient notes that she was seen on 12 1:29 week of upper respiratory symptoms including nasal congestion and cough.  She was started on azithromycin at that time.  She notes that the cough has improved but still feels some achiness in her body.  Patient denies any associated fever, difficulty breathing, significant sore throat.  Patient notes she has been using an albuterol inhaler occasionally at home with some improvement in her symptoms.  She is concerned that she still has some body aches and is concerned that she may have pneumonia.  Past Medical History:  Diagnosis Date  . Depressed   . Fibroids   . GERD (gastroesophageal reflux disease)   . Hypertension   . Obesity     Patient Active Problem List   Diagnosis Date Noted  . Asthmatic bronchitis 09/03/2016  . SUI (stress urinary incontinence, female) 02/06/2016  . Dysmenorrhea 12/25/2015  . Abnormal uterine bleeding (AUB) 12/25/2015  . Perimenopausal 12/25/2015  . Metatarsal deformity 11/09/2014  . Pain, foot 02/07/2013  . Equinus deformity of foot, acquired 02/07/2013  . Tenosynovitis of foot and ankle 02/07/2013    Past Surgical History:  Procedure Laterality Date  . DILATION AND CURETTAGE OF UTERUS  09/2006  . TUBAL LIGATION       OB History    Gravida  17   Para  7   Term  6   Preterm  1   AB  3   Living  7     SAB  2   TAB      Ectopic  1   Multiple  0   Live Births  7            Home Medications    Prior to Admission medications   Medication Sig Start Date End Date Taking? Authorizing Provider  acetaminophen (TYLENOL) 500 MG  tablet Take 500 mg by mouth as needed.    [provider]  albuterol (PROVENTIL HFA;VENTOLIN HFA) 108 (90 Base) MCG/ACT inhaler Inhale 2 puffs into the lungs every 6 (six) hours as needed for wheezing or shortness of breath. 01/06/16   Katy Apo, NP  ALPRAZolam Duanne Moron) 0.25 MG tablet Take 0.25 mg by mouth at bedtime as needed for anxiety.    [provider]  aspirin EC 81 MG tablet Take 81 mg by mouth daily.    [provider]  azithromycin (ZITHROMAX) 250 MG tablet Take 1 tablet (250 mg total) by mouth daily. Take first 2 tablets together, then 1 every day until finished. 02/06/18   Robinson, [place] N, PA-C  budesonide-formoterol (SYMBICORT) 80-4.5 MCG/ACT inhaler Inhale 2 puffs into the lungs daily. 05/09/17   Jaynee Eagles, PA-C  cetirizine (ZYRTEC ALLERGY) 10 MG tablet Take 1 tablet (10 mg total) by mouth daily. 05/09/17   Jaynee Eagles, PA-C  doxycycline (VIBRAMYCIN) 100 MG capsule Take 1 capsule (100 mg total) by mouth 2 (two) times daily. 06/21/17   Raylene Everts, MD  fluticasone [facility]) 50 MCG/ACT nasal spray Place 1 spray into both nostrils as needed. 05/09/17   Jaynee Eagles, PA-C  hydrochlorothiazide (HYDRODIURIL) 25 MG tablet  Take 12.5 mg by mouth daily.    [provider]  HYDROcodone-homatropine (HYCODAN) 5-1.5 MG/5ML syrup Take 5 mLs by mouth every 6 (six) hours as needed for cough. 02/06/18   Robinson, [place] N, PA-C  ibuprofen (ADVIL,MOTRIN) 800 MG tablet Take 1 tablet (800 mg total) by mouth 3 (three) times daily. 06/21/17   Raylene Everts, MD  metoprolol succinate (TOPROL-XL) 25 MG 24 hr tablet Take 25 mg by mouth daily. 08/04/16   [provider]  montelukast (SINGULAIR) 10 MG tablet Take 1 tablet (10 mg total) by mouth at bedtime. 05/09/17   Jaynee Eagles, PA-C  omeprazole (PRILOSEC) 20 MG capsule Take 20 mg daily by mouth.    [provider]  predniSONE (DELTASONE) 10 MG tablet Take 2 tablets (20 mg total) by mouth daily  for 4 days. 02/07/18 02/11/18  Robinson, [place] N, PA-C    Family History Family History  Problem Relation Age of Onset  . Heart failure Mother     Social History Social History   Tobacco Use  . Smoking status: Never Smoker  . Smokeless tobacco: Never Used  Substance Use Topics  . Alcohol use: Yes    Alcohol/week: 0.0 standard drinks    Comment: occasional-wine  . Drug use: No     Allergies   Darvocet [propoxyphene n-acetaminophen]; Darvon [propoxyphene hcl]; Percocet [oxycodone-acetaminophen]; and Stadol [butorphanol]   Review of Systems Review of Systems  All other systems reviewed and are negative.    Physical Exam Updated Vital Signs BP 121/71   Pulse 65   Temp 98.3 F (36.8 C) (Oral)   Resp (!) 24   SpO2 97%   Physical Exam Vitals signs and nursing note reviewed.  Constitutional:      Appearance: She is well-developed.  HENT:     Head: Normocephalic and atraumatic.  Eyes:     General: No scleral icterus.       Right eye: No discharge.        Left eye: No discharge.     Conjunctiva/sclera: Conjunctivae normal.     Pupils: Pupils are equal, round, and reactive to light.  Neck:     Musculoskeletal: Normal range of motion.     Vascular: No JVD.     Trachea: No tracheal deviation.  Pulmonary:     Effort: Pulmonary effort is normal. No respiratory distress.     Breath sounds: No stridor. No wheezing, rhonchi or rales.  Chest:     Chest wall: No tenderness.  Neurological:     Mental Status: She is alert and oriented to person, place, and time.     Coordination: Coordination normal.  Psychiatric:        Behavior: Behavior normal.        Thought Content: Thought content normal.        Judgment: Judgment normal.     ED Treatments / Results  Labs (all labs ordered are listed, but only abnormal results are displayed) Labs Reviewed - No data to display  EKG None  Radiology Dg Chest 2 View  Result Date: 02/09/2018 CLINICAL DATA:  Cough. EXAM:  CHEST - 2 VIEW COMPARISON:  01/10/2017 FINDINGS: The heart size and vascularity are normal. No infiltrates or effusions or other significant abnormalities. Bones are normal. IMPRESSION: Normal chest. Electronically Signed   By: Lorriane Shire M.D.   On: 02/09/2018 14:57    Procedures Procedures (including critical care time)  Medications Ordered in ED Medications  ipratropium-albuterol (DUONEB) 0.5-2.5 (3) MG/3ML nebulizer solution  3 mL (3 mLs Nebulization Given 02/09/18 1427)     Initial Impression / Assessment and Plan / ED Course  I have reviewed the triage vital signs and the nursing notes.  Pertinent labs & imaging results that were available during my care of the patient were reviewed by me and considered in my medical decision making (see chart for details).       Assessment/Plan: 53 year old female presents today with upper respiratory infection.  She notes that her symptoms are improving, she has a negative chest x-ray afebrile with clear lung sounds.  Discharged with outpatient follow-up and return precautions.  She verbalized understanding and agreement to today's plan.      Final Clinical Impressions(s) / ED Diagnoses   Final diagnoses:  Upper respiratory tract infection, unspecified type    ED Discharge Orders    None       Okey Regal, PA-C 02/09/18 1512    Malvin Johns, MD 02/10/18 2324

## 2018-02-11 DIAGNOSIS — J069 Acute upper respiratory infection, unspecified: Secondary | ICD-10-CM | POA: Diagnosis not present

## 2018-02-11 DIAGNOSIS — R232 Flushing: Secondary | ICD-10-CM | POA: Diagnosis not present

## 2018-02-13 ENCOUNTER — Encounter (HOSPITAL_COMMUNITY): Payer: Self-pay | Admitting: Emergency Medicine

## 2018-02-13 ENCOUNTER — Ambulatory Visit (HOSPITAL_COMMUNITY)
Admission: EM | Admit: 2018-02-13 | Discharge: 2018-02-13 | Disposition: A | Discharge status: Home / Self Care | Payer: Medicare Other

## 2018-02-13 ENCOUNTER — Other Ambulatory Visit: Payer: Self-pay

## 2018-02-13 DIAGNOSIS — J00 Acute nasopharyngitis [common cold]: Secondary | ICD-10-CM | POA: Diagnosis not present

## 2018-02-13 NOTE — Discharge Instructions (Signed)
For sore throat/cough try using a honey-based tea. Use 3 teaspoons of honey with juice squeezed from half lemon. Place shaved pieces of ginger into 1/2-1 cup of water and warm over stove top. Then mix the ingredients and repeat every 4 hours as needed.

## 2018-02-13 NOTE — ED Triage Notes (Signed)
Pt here for continued cough.  She has been seen twice in the ED getting nebulizer treatments, antibiotics, and prednisone.  Pt states she is not here for any medication she just wants to know if she is still wheezing.  Pt is using her inhaler as she is sitting in the room.  Pt also had a chest xray in the ED.

## 2018-02-13 NOTE — ED Provider Notes (Signed)
[place]    CSN: [id number] Arrival date & time: 02/13/18  1717     History   Chief Complaint Chief Complaint  Patient presents with  . URI    HPI Sarah Mathews is a 53 y.o. female.   53 year old female comes in for work notes. She has been seen multiple times in the past week for URI symptoms. She has finished a course of prednisone, azithromycin and was given different cough medicines for symptoms. She was seen 2 days ago by PCP for ongoing symptoms. She states she is in today for work note for tonight as she has not had a chance to pick up prescriptions from her visit with her PCP. States work requires long hours of talking, and having hard time doing so given cough. Last CXR 02/09/18 normal. Has not had fever, chills, night sweats. Has continued to use albuterol inhaler for wheezing.      Past Medical History:  Diagnosis Date  . Depressed   . Fibroids   . GERD (gastroesophageal reflux disease)   . Hypertension   . Obesity     Patient Active Problem List   Diagnosis Date Noted  . Asthmatic bronchitis 09/03/2016  . SUI (stress urinary incontinence, female) 02/06/2016  . Dysmenorrhea 12/25/2015  . Abnormal uterine bleeding (AUB) 12/25/2015  . Perimenopausal 12/25/2015  . Metatarsal deformity 11/09/2014  . Pain, foot 02/07/2013  . Equinus deformity of foot, acquired 02/07/2013  . Tenosynovitis of foot and ankle 02/07/2013    Past Surgical History:  Procedure Laterality Date  . DILATION AND CURETTAGE OF UTERUS  09/2006  . TUBAL LIGATION      OB History    Gravida  17   Para  7   Term  6   Preterm  1   AB  3   Living  7     SAB  2   TAB      Ectopic  1   Multiple  0   Live Births  7            Home Medications    Prior to Admission medications   Medication Sig Start Date End Date Taking? Authorizing Provider  albuterol (PROVENTIL HFA;VENTOLIN HFA) 108 (90 Base) MCG/ACT inhaler Inhale 2 puffs into the lungs every 6 (six)  hours as needed for wheezing or shortness of breath. 01/06/16  Yes AmyotNicholes Stairs, NP  aspirin EC 81 MG tablet Take 81 mg by mouth daily.   Yes [provider]  azithromycin (ZITHROMAX) 250 MG tablet Take 1 tablet (250 mg total) by mouth daily. Take first 2 tablets together, then 1 every day until finished. 02/06/18  Yes Quentin Cornwall, [place] N, PA-C  budesonide-formoterol (SYMBICORT) 80-4.5 MCG/ACT inhaler Inhale 2 puffs into the lungs daily. 05/09/17  Yes Jaynee Eagles, PA-C  HYDROcodone-homatropine [facility]) 5-1.5 MG/5ML syrup Take 5 mLs by mouth every 6 (six) hours as needed for cough. 02/06/18  Yes Robinson, [place] N, PA-C  metoprolol succinate (TOPROL-XL) 25 MG 24 hr tablet Take 25 mg by mouth daily. 08/04/16  Yes [provider]  acetaminophen (TYLENOL) 500 MG tablet Take 500 mg by mouth as needed.    [provider]  ALPRAZolam Duanne Moron) 0.25 MG tablet Take 0.25 mg by mouth at bedtime as needed for anxiety.    [provider]  cetirizine (ZYRTEC ALLERGY) 10 MG tablet Take 1 tablet (10 mg total) by mouth daily. 05/09/17   Jaynee Eagles, PA-C  fluticasone (FLONASE) 50 MCG/ACT  nasal spray Place 1 spray into both nostrils as needed. 05/09/17   Jaynee Eagles, PA-C  ibuprofen (ADVIL,MOTRIN) 800 MG tablet Take 1 tablet (800 mg total) by mouth 3 (three) times daily. 06/21/17   Raylene Everts, MD  omeprazole (PRILOSEC) 20 MG capsule Take 20 mg daily by mouth.    [provider]    Family History Family History  Problem Relation Age of Onset  . Heart failure Mother     Social History Social History   Tobacco Use  . Smoking status: Never Smoker  . Smokeless tobacco: Never Used  Substance Use Topics  . Alcohol use: Yes    Alcohol/week: 0.0 standard drinks    Comment: occasional-wine  . Drug use: No     Allergies   Darvocet [propoxyphene n-acetaminophen]; Darvon [propoxyphene hcl]; Percocet [oxycodone-acetaminophen]; and Stadol [butorphanol]   Review  of Systems Review of Systems  Reason unable to perform ROS: See HPI as above.     Physical Exam Triage Vital Signs ED Triage Vitals  Enc Vitals Group     BP 02/13/18 1831 135/74     Pulse Rate 02/13/18 1831 60     Resp --      Temp 02/13/18 1831 98.3 F (36.8 C)     Temp Source 02/13/18 1831 Oral     SpO2 02/13/18 1831 100 %     Weight --      Height --      Head Circumference --      Peak Flow --      Pain Score 02/13/18 1828 0     Pain Loc --      Pain Edu? --      Excl. in [place]? --    No data found.  Updated Vital Signs BP 135/74 (BP Location: Left Wrist)   Pulse 60   Temp 98.3 F (36.8 C) (Oral)   SpO2 100%       Physical Exam Constitutional:      General: She is not in acute distress.    Appearance: She is well-developed. She is not ill-appearing, toxic-appearing or diaphoretic.  HENT:     Head: Normocephalic and atraumatic.     Right Ear: Tympanic membrane, ear canal and external ear normal. Tympanic membrane is not erythematous or bulging.     Left Ear: Tympanic membrane, ear canal and external ear normal. Tympanic membrane is not erythematous or bulging.     Nose: Nose normal.     Right Sinus: No maxillary sinus tenderness or frontal sinus tenderness.     Left Sinus: No maxillary sinus tenderness or frontal sinus tenderness.     Mouth/Throat:     Mouth: Mucous membranes are moist.     Pharynx: Oropharynx is clear. Uvula midline.  Eyes:     Conjunctiva/sclera: Conjunctivae normal.     Pupils: Pupils are equal, round, and reactive to light.  Neck:     Musculoskeletal: Normal range of motion and neck supple.  Cardiovascular:     Rate and Rhythm: Normal rate and regular rhythm.     Heart sounds: Normal heart sounds. No murmur. No friction rub. No gallop.   Pulmonary:     Effort: Pulmonary effort is normal.     Breath sounds: Normal breath sounds. No decreased breath sounds, wheezing, rhonchi or rales.  Lymphadenopathy:     Cervical: No cervical  adenopathy.  Skin:    General: Skin is warm and dry.  Neurological:     Mental Status: She  is alert and oriented to person, place, and time.  Psychiatric:        Behavior: Behavior normal.        Judgment: Judgment normal.      UC Treatments / Results  Labs (all labs ordered are listed, but only abnormal results are displayed) Labs Reviewed - No data to display  EKG None  Radiology No results found.  Procedures Procedures (including critical care time)  Medications Ordered in UC Medications - No data to display  Initial Impression / Assessment and Plan / UC Course  I have reviewed the triage vital signs and the nursing notes.  Pertinent labs & imaging results that were available during my care of the patient were reviewed by me and considered in my medical decision making (see chart for details).    Will provide patient with work note for today. Exam reassuring. Will have patient pick up Rx from PCP 2 days ago for symptomatic relief. Return precautions given. Patient expresses understanding and agrees to plan.  Final Clinical Impressions(s) / UC Diagnoses   Final diagnoses:  Acute nasopharyngitis    ED Prescriptions    None        Ok Edwards, PA-C 02/13/18 2253

## 2018-02-22 DIAGNOSIS — E785 Hyperlipidemia, unspecified: Secondary | ICD-10-CM | POA: Diagnosis not present

## 2018-02-22 DIAGNOSIS — F17290 Nicotine dependence, other tobacco product, uncomplicated: Secondary | ICD-10-CM | POA: Diagnosis not present

## 2018-02-22 DIAGNOSIS — E559 Vitamin D deficiency, unspecified: Secondary | ICD-10-CM | POA: Diagnosis not present

## 2018-02-22 DIAGNOSIS — J40 Bronchitis, not specified as acute or chronic: Secondary | ICD-10-CM | POA: Diagnosis not present

## 2018-02-22 DIAGNOSIS — I10 Essential (primary) hypertension: Secondary | ICD-10-CM | POA: Diagnosis not present

## 2018-02-22 DIAGNOSIS — K219 Gastro-esophageal reflux disease without esophagitis: Secondary | ICD-10-CM | POA: Diagnosis not present

## 2018-02-22 DIAGNOSIS — F419 Anxiety disorder, unspecified: Secondary | ICD-10-CM | POA: Diagnosis not present

## 2018-04-28 ENCOUNTER — Other Ambulatory Visit: Payer: Self-pay

## 2018-04-28 ENCOUNTER — Encounter (HOSPITAL_COMMUNITY): Payer: Self-pay

## 2018-04-28 ENCOUNTER — Ambulatory Visit (HOSPITAL_COMMUNITY)
Admission: EM | Admit: 2018-04-28 | Discharge: 2018-04-28 | Disposition: A | Discharge status: Home / Self Care | Payer: Medicare Other | Attending: Family Medicine | Admitting: Family Medicine

## 2018-04-28 DIAGNOSIS — I10 Essential (primary) hypertension: Secondary | ICD-10-CM | POA: Diagnosis not present

## 2018-04-28 DIAGNOSIS — R519 Headache, unspecified: Secondary | ICD-10-CM

## 2018-04-28 DIAGNOSIS — R51 Headache: Secondary | ICD-10-CM | POA: Diagnosis not present

## 2018-04-28 DIAGNOSIS — J0110 Acute frontal sinusitis, unspecified: Secondary | ICD-10-CM | POA: Diagnosis not present

## 2018-04-28 MED ORDER — AMOXICILLIN 875 MG PO TABS: 875.0000 mg | ORAL_TABLET | Freq: Two times a day (BID) | ORAL | 0 refills | Status: AC

## 2018-04-28 MED ORDER — ALBUTEROL SULFATE HFA 108 (90 BASE) MCG/ACT IN AERS: 2.0000 | INHALATION_SPRAY | Freq: Four times a day (QID) | RESPIRATORY_TRACT | 2 refills | Status: AC | PRN

## 2018-04-28 MED ORDER — CETIRIZINE HCL 10 MG PO TABS: 10.0000 mg | ORAL_TABLET | Freq: Every day | ORAL | 2 refills | Status: DC

## 2018-04-28 MED ORDER — FLUTICASONE PROPIONATE 50 MCG/ACT NA SUSP: 1.0000 | Freq: Every day | NASAL | 2 refills | Status: DC | PRN

## 2018-04-28 NOTE — ED Triage Notes (Signed)
Pt cc sinus pressure and drainage and headaches. This started yesterday.

## 2018-04-28 NOTE — ED Provider Notes (Signed)
[place]    CSN: [id number] Arrival date & time: 04/28/18  1109     History   Chief Complaint Chief Complaint  Patient presents with  . Facial Pain    HPI Sarah Mathews is a 53 y.o. female.   53 year old female presents with nasal congestion, sinus pressure and sinus drainage for the past 3 to 4 days. Denies any fever, cough or GI symptoms. Yesterday symptoms have gotten worse with some nausea today. Has taken Flonase, Symbicort and Albuterol with minimal relief. Concerned since she was ill about 2 months ago with bronchitis, cough and sinus pressure that took 6 weeks to resolve. Also concerned that blood pressure is slightly elevated today. Currently on Toprol XL 25mg  daily. Other chronic health issues include seasonal allergies and GERD and currently on Aspirin daily. Was on Zyrtec but has not taken any recently. No other family members ill. No known exposure to COVID-19.   The history is provided by the patient.    Past Medical History:  Diagnosis Date  . Depressed   . Fibroids   . GERD (gastroesophageal reflux disease)   . Hypertension   . Obesity     Patient Active Problem List   Diagnosis Date Noted  . Asthmatic bronchitis 09/03/2016  . SUI (stress urinary incontinence, female) 02/06/2016  . Dysmenorrhea 12/25/2015  . Abnormal uterine bleeding (AUB) 12/25/2015  . Perimenopausal 12/25/2015  . Metatarsal deformity 11/09/2014  . Pain, foot 02/07/2013  . Equinus deformity of foot, acquired 02/07/2013  . Tenosynovitis of foot and ankle 02/07/2013    Past Surgical History:  Procedure Laterality Date  . DILATION AND CURETTAGE OF UTERUS  09/2006  . TUBAL LIGATION      OB History    Gravida  17   Para  7   Term  6   Preterm  1   AB  3   Living  7     SAB  2   TAB      Ectopic  1   Multiple  0   Live Births  7            Home Medications    Prior to Admission medications   Medication Sig Start Date End Date Taking?  Authorizing Provider  acetaminophen (TYLENOL) 500 MG tablet Take 500 mg by mouth as needed.    [provider]  albuterol (PROVENTIL HFA;VENTOLIN HFA) 108 (90 Base) MCG/ACT inhaler Inhale 2 puffs into the lungs every 6 (six) hours as needed for wheezing or shortness of breath. 04/28/18   Katy Apo, NP  ALPRAZolam Duanne Moron) 0.25 MG tablet Take 0.25 mg by mouth at bedtime as needed for anxiety.    [provider]  amoxicillin (AMOXIL) 875 MG tablet Take 1 tablet (875 mg total) by mouth 2 (two) times daily for 7 days. 04/28/18 05/05/18  Katy Apo, NP  aspirin EC 81 MG tablet Take 81 mg by mouth daily.    [provider]  budesonide-formoterol (SYMBICORT) 80-4.5 MCG/ACT inhaler Inhale 2 puffs into the lungs daily. 05/09/17   Jaynee Eagles, PA-C  cetirizine (ZYRTEC ALLERGY) 10 MG tablet Take 1 tablet (10 mg total) by mouth daily. 04/28/18   Katy Apo, NP  fluticasone (FLONASE) 50 MCG/ACT nasal spray Place 1 spray into both nostrils daily as needed for allergies. 04/28/18   Katy Apo, NP  metoprolol succinate (TOPROL-XL) 25 MG 24 hr tablet Take 25 mg by mouth daily. 08/04/16   [provider]    Family History Family History  Problem Relation Age of Onset  . Heart failure Mother     Social History Social History   Tobacco Use  . Smoking status: Never Smoker  . Smokeless tobacco: Never Used  Substance Use Topics  . Alcohol use: Yes    Alcohol/week: 0.0 standard drinks    Comment: occasional-wine  . Drug use: No     Allergies   Darvocet [propoxyphene n-acetaminophen]; Darvon [propoxyphene hcl]; Percocet [oxycodone-acetaminophen]; and Stadol [butorphanol]   Review of Systems Review of Systems  Constitutional: Positive for fatigue. Negative for activity change, appetite change, chills and fever.  HENT: Positive for congestion, postnasal drip, sinus pressure, sinus pain and sore throat (irritated). Negative for ear discharge, ear pain,  facial swelling, mouth sores, nosebleeds, rhinorrhea, sneezing and trouble swallowing.   Eyes: Negative for pain, discharge, redness and itching.  Respiratory: Negative for cough, chest tightness, shortness of breath and wheezing.   Gastrointestinal: Positive for nausea. Negative for abdominal pain, diarrhea and vomiting.  Musculoskeletal: Negative for arthralgias, myalgias, neck pain and neck stiffness.  Skin: Negative for color change, rash and wound.  Allergic/Immunologic: Positive for environmental allergies.  Neurological: Positive for headaches. Negative for dizziness, tremors, seizures, syncope, weakness, light-headedness and numbness.  Hematological: Negative for adenopathy. Bruises/bleeds easily.     Physical Exam Triage Vital Signs ED Triage Vitals  Enc Vitals Group     BP 04/28/18 1122 (!) 150/83     Pulse Rate 04/28/18 1122 68     Resp 04/28/18 1122 18     Temp 04/28/18 1122 98.4 F (36.9 C)     Temp Source 04/28/18 1122 Oral     SpO2 04/28/18 1122 99 %     Weight 04/28/18 1126 266 lb (120.7 kg)     Height --      Head Circumference --      Peak Flow --      Pain Score 04/28/18 1126 9     Pain Loc --      Pain Edu? --      Excl. in [place]? --    No data found.  Updated Vital Signs BP (!) 150/83 (BP Location: Right Arm)   Pulse 68   Temp 98.4 F (36.9 C) (Oral)   Resp 18   Wt 266 lb (120.7 kg)   SpO2 99%   BMI 45.66 kg/m   Visual Acuity Right Eye Distance:   Left Eye Distance:   Bilateral Distance:    Right Eye Near:   Left Eye Near:    Bilateral Near:     Physical Exam Vitals signs and nursing note reviewed.  Constitutional:      General: She is awake. She is not in acute distress.    Appearance: She is well-developed, well-groomed and overweight. She is ill-appearing.     Comments: Patient sitting comfortably on exam table in no acute distress but appears ill and in pain   HENT:     Head: Normocephalic and atraumatic.     Right Ear: Hearing,  tympanic membrane, ear canal and external ear normal.     Left Ear: Hearing, tympanic membrane, ear canal and external ear normal.     Nose: Mucosal edema and congestion present.     Right Turbinates: Swollen.     Left Turbinates: Swollen.     Right Sinus: Maxillary sinus tenderness and frontal sinus tenderness present.     Left Sinus: Frontal sinus tenderness present. No maxillary sinus tenderness.  Mouth/Throat:     Lips: Pink.     Mouth: Mucous membranes are moist.     Pharynx: Uvula midline. Oropharyngeal exudate (slight yellowish post nasal drainage present) and posterior oropharyngeal erythema present. No pharyngeal swelling or uvula swelling.  Eyes:     Extraocular Movements: Extraocular movements intact.     Conjunctiva/sclera: Conjunctivae normal.  Neck:     Musculoskeletal: Normal range of motion and neck supple. No neck rigidity or muscular tenderness.  Cardiovascular:     Rate and Rhythm: Normal rate and regular rhythm.     Heart sounds: Normal heart sounds. No murmur.  Pulmonary:     Effort: Pulmonary effort is normal. No respiratory distress.     Breath sounds: Normal breath sounds and air entry. No stridor. No decreased breath sounds, wheezing, rhonchi or rales.  Musculoskeletal: Normal range of motion.  Lymphadenopathy:     Cervical: No cervical adenopathy.  Skin:    General: Skin is warm and dry.     Capillary Refill: Capillary refill takes less than 2 seconds.     Findings: No rash.  Neurological:     General: No focal deficit present.     Mental Status: She is alert and oriented to person, place, and time.  Psychiatric:        Mood and Affect: Mood normal.        Behavior: Behavior normal. Behavior is cooperative.        Thought Content: Thought content normal.        Judgment: Judgment normal.      UC Treatments / Results  Labs (all labs ordered are listed, but only abnormal results are displayed) Labs Reviewed - No data to display  EKG None   Radiology No results found.  Procedures Procedures (including critical care time)  Medications Ordered in UC Medications - No data to display  Initial Impression / Assessment and Plan / UC Course  I have reviewed the triage vital signs and the nursing notes.  Pertinent labs & imaging results that were available during my care of the patient were reviewed by me and considered in my medical decision making (see chart for details).    Reviewed with patient that she probably has some seasonal allergies and early sinusitis. Recommend restart Zyrtec 10mg  daily. Continue Flonase 2 sprays each nostril daily while ill then decrease to 1 spray daily. Continue Proventil 2 puffs every 6 hours as needed. Due to history of severity of sinus symptoms and history of recent sinus infection and bronchitis 2 months ago- may start Amoxicillin 875mg  twice a day as directed. Continue Symbicort as directed. Increase fluids to help loosen up mucus. May take Ibuprofen 600mg  every 6 hours as needed for headache. Continue to monitor blood pressure- may need to increase dose since on low dose of Toprol. Talk with her PCP regarding blood pressure management. Note written for work. Follow-up with her PCP in 4 to 5 days if sinus symptoms do not improve.  Final Clinical Impressions(s) / UC Diagnoses   Final diagnoses:  Acute non-recurrent frontal sinusitis  Sinus headache  Elevated blood pressure reading with diagnosis of hypertension     Discharge Instructions     Recommend start Amoxicillin 875mg  twice a day as directed. Continue Flonase 2 sprays each nostril while sick then 1 spray daily. Restart generic Zyrtec 10mg  daily. Continue Proventil 2 puffs every 6 hours as needed for wheezing. Continue Symbicort as directed. Increase fluids to help loosen up mucus. May take Ibuprofen 600mg   every 6 hours as needed for headache. Continue to monitor your blood pressure- may need to increase dose of blood pressure medication-  to be decided by your PCP. Follow-up with your PCP in 4 to 5 days if not improving.     ED Prescriptions    Medication Sig Dispense Auth. Provider   albuterol (PROVENTIL HFA;VENTOLIN HFA) 108 (90 Base) MCG/ACT inhaler Inhale 2 puffs into the lungs every 6 (six) hours as needed for wheezing or shortness of breath. 1 Inhaler Tavi Gaughran, Nicholes Stairs, NP   cetirizine (ZYRTEC ALLERGY) 10 MG tablet Take 1 tablet (10 mg total) by mouth daily. 30 tablet Katy Apo, NP   fluticasone [facility]) 50 MCG/ACT nasal spray Place 1 spray into both nostrils daily as needed for allergies. 16 g Katy Apo, NP   amoxicillin (AMOXIL) 875 MG tablet Take 1 tablet (875 mg total) by mouth 2 (two) times daily for 7 days. 14 tablet Katy Apo, NP     Controlled Substance Prescriptions [place] Controlled Substance Registry consulted? Not Applicable   Katy Apo, NP 04/28/18 1243

## 2018-04-28 NOTE — Discharge Instructions (Addendum)
Recommend start Amoxicillin 875mg  twice a day as directed. Continue Flonase 2 sprays each nostril while sick then 1 spray daily. Restart generic Zyrtec 10mg  daily. Continue Proventil 2 puffs every 6 hours as needed for wheezing. Continue Symbicort as directed. Increase fluids to help loosen up mucus. May take Ibuprofen 600mg  every 6 hours as needed for headache. Continue to monitor your blood pressure- may need to increase dose of blood pressure medication- to be decided by your PCP. Follow-up with your PCP in 4 to 5 days if not improving.

## 2018-05-12 ENCOUNTER — Ambulatory Visit (HOSPITAL_COMMUNITY)
Admission: EM | Admit: 2018-05-12 | Discharge: 2018-05-12 | Disposition: A | Discharge status: Home / Self Care | Payer: Medicare Other | Attending: Family Medicine | Admitting: Family Medicine

## 2018-05-12 ENCOUNTER — Encounter (HOSPITAL_COMMUNITY): Payer: Self-pay | Admitting: Emergency Medicine

## 2018-05-12 DIAGNOSIS — I10 Essential (primary) hypertension: Secondary | ICD-10-CM

## 2018-05-12 DIAGNOSIS — J4520 Mild intermittent asthma, uncomplicated: Secondary | ICD-10-CM | POA: Diagnosis not present

## 2018-05-12 DIAGNOSIS — R062 Wheezing: Secondary | ICD-10-CM

## 2018-05-12 DIAGNOSIS — J301 Allergic rhinitis due to pollen: Secondary | ICD-10-CM

## 2018-05-12 DIAGNOSIS — R030 Elevated blood-pressure reading, without diagnosis of hypertension: Secondary | ICD-10-CM | POA: Diagnosis not present

## 2018-05-12 MED ORDER — PREDNISONE 10 MG (21) PO TBPK: ORAL_TABLET | Freq: Every day | ORAL | 0 refills | Status: DC

## 2018-05-12 NOTE — ED Notes (Signed)
Patient verbalizes understanding of discharge instructions. Opportunity for questioning and answers were provided. Patient discharged from [facility] by RN.  

## 2018-05-12 NOTE — ED Triage Notes (Signed)
Pt was seen here two weeks ago for sinus issues and headaches, still having symptoms. States she also has allergies and asthma and has been feeling short of breath.

## 2018-05-12 NOTE — ED Provider Notes (Signed)
[place]   [id number] 05/12/18 Arrival Time: 1093  ASSESSMENT & PLAN:  1. Seasonal allergic rhinitis due to pollen   2. Wheezing   3. Essential hypertension    No indication for chest imaging at this time. Discussed.  Trial of: Meds ordered this encounter  Medications  . predniSONE (STERAPRED UNI-PAK 21 TAB) 10 MG (21) TBPK tablet    Sig: Take by mouth daily. Take as directed.    Dispense:  21 tablet    Refill:  0   For nasal congestion, discussed use of Afrin very sparingly; 2-3 days max. OTC symptom care/analgesics as needed. Work note provided.  Very anxious regarding her BP today. Has been taking medications as directed. While pt here, she spoke with her cardiologist regarding increased blood pressures this visit and last. Will schedule f/u with him.  Reviewed expectations re: course of current medical issues. Questions answered. Outlined signs and symptoms indicating need for more acute intervention. Patient verbalized understanding. After Visit Summary given.  SUBJECTIVE: History from: patient. Seen here on 04/28/2018 Sarah Mathews is a 53 y.o. female who presents with complaint of intermittent chest tightness and wheezing that has been more prominent over the past few days. Has been taking previously prescribed medications. Reports continued nasal congestion. Feels like she is now wheezing more often. No specific SOB. Symptoms worse at night. Triggers: pollen. Describes wheezing as mild to moderate when present. Albuterol inhaler does help temporarily. Fever: no. Overall normal PO intake without n/v. Sick contacts: no. OTC treatment: none reported.  Social History   Tobacco Use  Smoking Status Never Smoker  Smokeless Tobacco Never Used   ROS: As per HPI. All other systems negative.    OBJECTIVE:  Vitals:   05/12/18 1609  BP: (!) 153/81  Pulse: 66  Resp: 16  Temp: 98.3 F (36.8 C)  SpO2: 98%    General appearance: alert; no distress HEENT:  nasal congestion; throat irritation secondary to post-nasal drainage Neck: supple without LAD CV: RRR without murmer Lungs: unlabored respirations, mild bilateral expiratory wheezing; cough: absent; no significant respiratory distress; speaking full sentences without difficulty Skin: warm and dry Neuro: normal gait Psychological: alert and cooperative; normal mood and affect  Allergies  Allergen Reactions  . Darvocet [Propoxyphene N-Acetaminophen] Other (See Comments)    In high amount heart palpitations  . Darvon [Propoxyphene Hcl]   . Percocet [Oxycodone-Acetaminophen] Nausea And Vomiting  . Stadol [Butorphanol] Other (See Comments)    delierious    Past Medical History:  Diagnosis Date  . Depressed   . Fibroids   . GERD (gastroesophageal reflux disease)   . Hypertension   . Obesity    Family History  Problem Relation Age of Onset  . Heart failure Mother    Social History   Socioeconomic History  . Marital status: Single    Spouse name: Not on file  . Number of children: Not on file  . Years of education: Not on file  . Highest education level: Not on file  Occupational History  . Not on file  Social Needs  . Financial resource strain: Not on file  . Food insecurity:    Worry: Not on file    Inability: Not on file  . Transportation needs:    Medical: Not on file    Non-medical: Not on file  Tobacco Use  . Smoking status: Never Smoker  . Smokeless tobacco: Never Used  Substance and Sexual Activity  . Alcohol use: Yes    Alcohol/week:  0.0 standard drinks    Comment: occasional-wine  . Drug use: No  . Sexual activity: Yes    Birth control/protection: Surgical  Lifestyle  . Physical activity:    Days per week: Not on file    Minutes per session: Not on file  . Stress: Not on file  Relationships  . Social connections:    Talks on phone: Not on file    Gets together: Not on file    Attends religious service: Not on file    Active member of club or  organization: Not on file    Attends meetings of clubs or organizations: Not on file    Relationship status: Not on file  . Intimate partner violence:    Fear of current or ex partner: Not on file    Emotionally abused: Not on file    Physically abused: Not on file    Forced sexual activity: Not on file  Other Topics Concern  . Not on file  Social History Narrative  . Not on file            Vanessa Kick, MD 05/12/18 [phone]

## 2018-05-16 DIAGNOSIS — R030 Elevated blood-pressure reading, without diagnosis of hypertension: Secondary | ICD-10-CM | POA: Diagnosis not present

## 2018-05-16 DIAGNOSIS — J4520 Mild intermittent asthma, uncomplicated: Secondary | ICD-10-CM | POA: Diagnosis not present

## 2018-06-02 DIAGNOSIS — J4520 Mild intermittent asthma, uncomplicated: Secondary | ICD-10-CM | POA: Diagnosis not present

## 2018-06-02 DIAGNOSIS — R030 Elevated blood-pressure reading, without diagnosis of hypertension: Secondary | ICD-10-CM | POA: Diagnosis not present

## 2018-06-02 DIAGNOSIS — F419 Anxiety disorder, unspecified: Secondary | ICD-10-CM | POA: Diagnosis not present

## 2018-06-03 DIAGNOSIS — E785 Hyperlipidemia, unspecified: Secondary | ICD-10-CM | POA: Diagnosis not present

## 2018-06-03 DIAGNOSIS — F17290 Nicotine dependence, other tobacco product, uncomplicated: Secondary | ICD-10-CM | POA: Diagnosis not present

## 2018-06-03 DIAGNOSIS — I10 Essential (primary) hypertension: Secondary | ICD-10-CM | POA: Diagnosis not present

## 2018-06-03 DIAGNOSIS — J45909 Unspecified asthma, uncomplicated: Secondary | ICD-10-CM | POA: Diagnosis not present

## 2018-06-03 DIAGNOSIS — F419 Anxiety disorder, unspecified: Secondary | ICD-10-CM | POA: Diagnosis not present

## 2018-06-03 DIAGNOSIS — E559 Vitamin D deficiency, unspecified: Secondary | ICD-10-CM | POA: Diagnosis not present

## 2018-06-27 DIAGNOSIS — Z1211 Encounter for screening for malignant neoplasm of colon: Secondary | ICD-10-CM | POA: Diagnosis not present

## 2018-06-27 DIAGNOSIS — Z1212 Encounter for screening for malignant neoplasm of rectum: Secondary | ICD-10-CM | POA: Diagnosis not present

## 2018-06-27 DIAGNOSIS — N951 Menopausal and female climacteric states: Secondary | ICD-10-CM | POA: Diagnosis not present

## 2018-06-27 DIAGNOSIS — F419 Anxiety disorder, unspecified: Secondary | ICD-10-CM | POA: Diagnosis not present

## 2018-06-27 DIAGNOSIS — I10 Essential (primary) hypertension: Secondary | ICD-10-CM | POA: Diagnosis not present

## 2018-06-27 DIAGNOSIS — R1030 Lower abdominal pain, unspecified: Secondary | ICD-10-CM | POA: Diagnosis not present

## 2018-07-09 ENCOUNTER — Emergency Department (HOSPITAL_COMMUNITY): Payer: Medicare Other

## 2018-07-09 ENCOUNTER — Other Ambulatory Visit: Payer: Self-pay

## 2018-07-09 ENCOUNTER — Encounter (HOSPITAL_COMMUNITY): Payer: Self-pay | Admitting: Emergency Medicine

## 2018-07-09 ENCOUNTER — Emergency Department (HOSPITAL_COMMUNITY)
Admission: EM | Admit: 2018-07-09 | Discharge: 2018-07-09 | Disposition: A | Discharge status: Home / Self Care | Payer: Medicare Other | Attending: Emergency Medicine | Admitting: Emergency Medicine

## 2018-07-09 DIAGNOSIS — Y999 Unspecified external cause status: Secondary | ICD-10-CM | POA: Diagnosis not present

## 2018-07-09 DIAGNOSIS — Y929 Unspecified place or not applicable: Secondary | ICD-10-CM | POA: Diagnosis not present

## 2018-07-09 DIAGNOSIS — Y939 Activity, unspecified: Secondary | ICD-10-CM | POA: Diagnosis not present

## 2018-07-09 DIAGNOSIS — M545 Low back pain: Secondary | ICD-10-CM | POA: Diagnosis not present

## 2018-07-09 DIAGNOSIS — Z79899 Other long term (current) drug therapy: Secondary | ICD-10-CM | POA: Diagnosis not present

## 2018-07-09 DIAGNOSIS — N730 Acute parametritis and pelvic cellulitis: Secondary | ICD-10-CM

## 2018-07-09 DIAGNOSIS — S3992XA Unspecified injury of lower back, initial encounter: Secondary | ICD-10-CM | POA: Diagnosis not present

## 2018-07-09 DIAGNOSIS — I10 Essential (primary) hypertension: Secondary | ICD-10-CM | POA: Diagnosis not present

## 2018-07-09 DIAGNOSIS — N739 Female pelvic inflammatory disease, unspecified: Secondary | ICD-10-CM | POA: Diagnosis not present

## 2018-07-09 DIAGNOSIS — S3993XA Unspecified injury of pelvis, initial encounter: Secondary | ICD-10-CM | POA: Diagnosis not present

## 2018-07-09 DIAGNOSIS — R1030 Lower abdominal pain, unspecified: Secondary | ICD-10-CM | POA: Diagnosis not present

## 2018-07-09 DIAGNOSIS — Z7982 Long term (current) use of aspirin: Secondary | ICD-10-CM | POA: Insufficient documentation

## 2018-07-09 DIAGNOSIS — D259 Leiomyoma of uterus, unspecified: Secondary | ICD-10-CM | POA: Diagnosis not present

## 2018-07-09 DIAGNOSIS — S300XXA Contusion of lower back and pelvis, initial encounter: Secondary | ICD-10-CM | POA: Insufficient documentation

## 2018-07-09 DIAGNOSIS — W108XXA Fall (on) (from) other stairs and steps, initial encounter: Secondary | ICD-10-CM | POA: Diagnosis not present

## 2018-07-09 DIAGNOSIS — R102 Pelvic and perineal pain: Secondary | ICD-10-CM | POA: Diagnosis not present

## 2018-07-09 LAB — WET PREP, GENITAL
Sperm: NONE SEEN
Trich, Wet Prep: NONE SEEN
Yeast Wet Prep HPF POC: NONE SEEN

## 2018-07-09 LAB — COMPREHENSIVE METABOLIC PANEL
ALT: 13 U/L (ref 0–44)
AST: 17 U/L (ref 15–41)
Albumin: 3.8 g/dL (ref 3.5–5.0)
Alkaline Phosphatase: 61 U/L (ref 38–126)
Anion gap: 11 (ref 5–15)
BUN: 9 mg/dL (ref 6–20)
CO2: 24 mmol/L (ref 22–32)
Calcium: 9.4 mg/dL (ref 8.9–10.3)
Chloride: 105 mmol/L (ref 98–111)
Creatinine, Ser: 0.91 mg/dL (ref 0.44–1.00)
GFR calc Af Amer: >60 mL/min (ref >60)
GFR calc non Af Amer: >60 mL/min (ref >60)
Glucose, Bld: 102 mg/dL — ABNORMAL HIGH (ref 70–99)
Potassium: 3.9 mmol/L (ref 3.5–5.1)
Sodium: 140 mmol/L (ref 135–145)
Total Bilirubin: 0.3 mg/dL (ref 0.3–1.2)
Total Protein: 7.5 g/dL (ref 6.5–8.1)

## 2018-07-09 LAB — CBC WITH DIFFERENTIAL/PLATELET
Abs Immature Granulocytes: 0.01 10*3/uL (ref 0.00–0.07)
Basophils Absolute: 0 10*3/uL (ref 0.0–0.1)
Basophils Relative: 1 %
Eosinophils Absolute: 0.5 10*3/uL (ref 0.0–0.5)
Eosinophils Relative: 8 %
HCT: 34.1 % — ABNORMAL LOW (ref 36.0–46.0)
Hemoglobin: 10.5 g/dL — ABNORMAL LOW (ref 12.0–15.0)
Immature Granulocytes: 0 %
Lymphocytes Relative: 33 %
Lymphs Abs: 2 10*3/uL (ref 0.7–4.0)
MCH: 26.4 pg (ref 26.0–34.0)
MCHC: 30.8 g/dL (ref 30.0–36.0)
MCV: 85.7 fL (ref 80.0–100.0)
Monocytes Absolute: 0.5 10*3/uL (ref 0.1–1.0)
Monocytes Relative: 8 %
Neutro Abs: 2.9 10*3/uL (ref 1.7–7.7)
Neutrophils Relative %: 50 %
Platelets: 243 10*3/uL (ref 150–400)
RBC: 3.98 MIL/uL (ref 3.87–5.11)
RDW: 14.1 % (ref 11.5–15.5)
WBC: 5.9 10*3/uL (ref 4.0–10.5)
nRBC: 0 % (ref 0.0–0.2)

## 2018-07-09 LAB — I-STAT BETA HCG BLOOD, ED (MC, WL, AP ONLY): I-stat hCG, quantitative: <5 m[IU]/mL (ref <5)

## 2018-07-09 LAB — URINALYSIS, ROUTINE W REFLEX MICROSCOPIC
Bacteria, UA: NONE SEEN
Bilirubin Urine: NEGATIVE
Glucose, UA: NEGATIVE mg/dL
Hgb urine dipstick: NEGATIVE
Ketones, ur: NEGATIVE mg/dL
Nitrite: NEGATIVE
Protein, ur: NEGATIVE mg/dL
Specific Gravity, Urine: 1.025 (ref 1.005–1.030)
pH: 6 (ref 5.0–8.0)

## 2018-07-09 MED ORDER — LEVOFLOXACIN 500 MG PO TABS: 500.0000 mg | ORAL_TABLET | Freq: Every day | ORAL | 0 refills | Status: DC

## 2018-07-09 MED ORDER — KETOROLAC TROMETHAMINE 30 MG/ML IJ SOLN
60.0000 mg | Freq: Once | INTRAMUSCULAR | Status: DC
  Filled 2018-07-09: qty 2

## 2018-07-09 MED ORDER — ACETAMINOPHEN 500 MG PO TABS
1000.0000 mg | ORAL_TABLET | Freq: Once | ORAL | Status: AC
  Administered 2018-07-09: 1000 mg via ORAL
  Filled 2018-07-09: qty 2

## 2018-07-09 MED ORDER — CEFTRIAXONE SODIUM 250 MG IJ SOLR
250.0000 mg | Freq: Once | INTRAMUSCULAR | Status: DC
  Filled 2018-07-09: qty 250

## 2018-07-09 MED ORDER — HYDROCODONE-ACETAMINOPHEN 5-325 MG PO TABS: 1.0000 | ORAL_TABLET | ORAL | 0 refills | Status: DC | PRN

## 2018-07-09 MED ORDER — ONDANSETRON 4 MG PO TBDP: 4.0000 mg | ORAL_TABLET | Freq: Four times a day (QID) | ORAL | 0 refills | Status: DC | PRN

## 2018-07-09 MED ORDER — LIDOCAINE HCL (PF) 1 % IJ SOLN
INTRAMUSCULAR | Status: AC
  Filled 2018-07-09: qty 5

## 2018-07-09 MED ORDER — METRONIDAZOLE 500 MG PO TABS
500.0000 mg | ORAL_TABLET | Freq: Once | ORAL | Status: AC
  Administered 2018-07-09: 500 mg via ORAL
  Filled 2018-07-09: qty 1

## 2018-07-09 MED ORDER — ONDANSETRON 4 MG PO TBDP
4.0000 mg | ORAL_TABLET | Freq: Once | ORAL | Status: DC
  Filled 2018-07-09: qty 1

## 2018-07-09 MED ORDER — METRONIDAZOLE 500 MG PO TABS: 500.0000 mg | ORAL_TABLET | Freq: Two times a day (BID) | ORAL | 0 refills | Status: DC

## 2018-07-09 MED ORDER — AZITHROMYCIN 250 MG PO TABS
1000.0000 mg | ORAL_TABLET | Freq: Once | ORAL | Status: AC
  Administered 2018-07-09: 1000 mg via ORAL
  Filled 2018-07-09: qty 4

## 2018-07-09 MED ORDER — DOXYCYCLINE HYCLATE 100 MG PO TABS
100.0000 mg | ORAL_TABLET | Freq: Once | ORAL | Status: DC
  Filled 2018-07-09: qty 1

## 2018-07-09 NOTE — ED Triage Notes (Signed)
Reports falling down some steps last Saturday.  Now having low back pain that radiates down the left leg.  Also c/o lower abdominal pain worried she might be pregnant.

## 2018-07-09 NOTE — ED Notes (Signed)
Per [place] Abd Pelv Doppler not collected d/t unable to locate ovaries. Dr. Leonides Schanz Informed

## 2018-07-09 NOTE — ED Notes (Addendum)
Patient not in room. Transported to [place]

## 2018-07-09 NOTE — ED Notes (Signed)
Discharge instructions reviewed with pt. Pt has no questions at this time.

## 2018-07-09 NOTE — Discharge Instructions (Addendum)
I would not take Levaquin and Zofran at the same time.  Please only use the Zofran if absolutely needed for nausea and vomiting.  Do not drink alcohol when taking metronidazole.  Please take your antibiotics until complete.  Please follow-up with an OB/GYN.   [place]:  [facility] [employer] [facility] at [place], [place] [phone]  [facility] [place] [facility] [place], [place] [phone]  [place]  # [place], [place] [phone]   [place] [address] [place], [place] [phone]  [facility] [address] [place], [place] [phone]   [facility] [place] # [place], [place] [phone]   [facility] [address] [place], [place] [phone]   [facility] [address] [place], [place] [phone]   [facility] [address] [place], [place] [phone]

## 2018-07-09 NOTE — ED Notes (Signed)
Pt. States allergies to medications. Reviewed allergy list with pt and updated. MD notified.

## 2018-07-09 NOTE — ED Provider Notes (Signed)
TIME SEEN: 1:48 AM  CHIEF COMPLAINT: Abdominal pain, back pain  HPI: Patient is a 53 year old female with history of obesity, hypertension, uterine fibroids who presents to the emergency department with 1 month of lower abdominal pain that radiates into her back.  Pain is worse with palpation and movement.  She states she has had nausea but no vomiting, diarrhea, dysuria, hematuria, vaginal bleeding or discharge.  Her last menstrual period was 2 months ago and have been irregular for the past year.  She states she is concerned that she could be pregnant.  She is sexually active with one female partner and does not use protection.  States she has had chlamydia approximately 30 years ago that was treated.  No other STDs.  She states she has been pregnant 10 times and has 7 children.  She has had 2 D&Cs in 1 ectopic pregnancy requiring left-sided salpingectomy.  She also reports that 6 days ago she slipped on a rug and fell onto her back onto some stairs.  No head injury or loss of consciousness.  States that she is having increasing lower back pain because of this.  States she has a history of sciatica and always has some pain and numbness that goes down the right leg which is unchanged.  No other new numbness or focal weakness.  No bowel or bladder incontinence.  She denies fevers, cough, chills, chest pain, shortness of breath.  ROS: See HPI Constitutional: no fever  Eyes: no drainage  ENT: no runny nose   Cardiovascular:  no chest pain  Resp: no SOB  GI: no vomiting GU: no dysuria Integumentary: no rash  Allergy: no hives  Musculoskeletal: no leg swelling  Neurological: no slurred speech ROS otherwise negative  PAST MEDICAL HISTORY/PAST SURGICAL HISTORY:  Past Medical History:  Diagnosis Date  . Depressed   . Fibroids   . GERD (gastroesophageal reflux disease)   . Hypertension   . Obesity     MEDICATIONS:  Prior to Admission medications   Medication Sig Start Date End Date Taking?  Authorizing Provider  acetaminophen (TYLENOL) 500 MG tablet Take 500 mg by mouth as needed.    [provider]  albuterol (PROVENTIL HFA;VENTOLIN HFA) 108 (90 Base) MCG/ACT inhaler Inhale 2 puffs into the lungs every 6 (six) hours as needed for wheezing or shortness of breath. 04/28/18   Katy Apo, NP  ALPRAZolam Duanne Moron) 0.25 MG tablet Take 0.25 mg by mouth at bedtime as needed for anxiety.    [provider]  aspirin EC 81 MG tablet Take 81 mg by mouth daily.    [provider]  budesonide-formoterol (SYMBICORT) 80-4.5 MCG/ACT inhaler Inhale 2 puffs into the lungs daily. 05/09/17   Jaynee Eagles, PA-C  cetirizine (ZYRTEC ALLERGY) 10 MG tablet Take 1 tablet (10 mg total) by mouth daily. 04/28/18   Katy Apo, NP  fluticasone (FLONASE) 50 MCG/ACT nasal spray Place 1 spray into both nostrils daily as needed for allergies. 04/28/18   Katy Apo, NP  metoprolol succinate (TOPROL-XL) 25 MG 24 hr tablet Take 25 mg by mouth daily. 08/04/16   [provider]  predniSONE (STERAPRED UNI-PAK 21 TAB) 10 MG (21) TBPK tablet Take by mouth daily. Take as directed. 05/12/18   Vanessa Kick, MD    ALLERGIES:  Allergies  Allergen Reactions  . Darvocet [Propoxyphene N-Acetaminophen] Other (See Comments)    In high amount heart palpitations  . Darvon [Propoxyphene Hcl]   . Percocet [Oxycodone-Acetaminophen] Nausea And Vomiting  .  Stadol [Butorphanol] Other (See Comments)    delierious    SOCIAL HISTORY:  Social History   Tobacco Use  . Smoking status: Never Smoker  . Smokeless tobacco: Never Used  Substance Use Topics  . Alcohol use: Yes    Alcohol/week: 0.0 standard drinks    Comment: occasional-wine    FAMILY HISTORY: Family History  Problem Relation Age of Onset  . Heart failure Mother     EXAM: BP 125/71 (BP Location: Right Arm)   Pulse 72   Temp 98.6 F (37 C) (Oral)   Resp 18   Ht 5\' 4"  (1.626 m)   Wt 121.1 kg   SpO2 100%   BMI 45.83  kg/m  CONSTITUTIONAL: Alert and oriented and responds appropriately to questions. Well-appearing; well-nourished, obese HEAD: Normocephalic, atraumatic EYES: Conjunctivae clear, pupils appear equal, EOMI ENT: normal nose; moist mucous membranes NECK: Supple, no meningismus, no nuchal rigidity, no LAD  CARD: RRR; S1 and S2 appreciated; no murmurs, no clicks, no rubs, no gallops RESP: Normal chest excursion without splinting or tachypnea; breath sounds clear and equal bilaterally; no wheezes, no rhonchi, no rales, no hypoxia or respiratory distress, speaking full sentences ABD/GI: Normal bowel sounds; non-distended; soft, tender throughout the pelvic region, no guarding or rebound, no peritoneal signs, no tenderness at McBurney's point GU:  Normal external genitalia. No lesions, rashes noted. Patient has no vaginal bleeding on exam.  Moderate thick yellow vaginal discharge from the cervical loss.  Patient has bilateral adnexal tenderness without obvious fullness or mass appreciated but exam limited due to obesity.  She has significant cervical motion tenderness on exam.  Cervix is not appear friable.  Cervix is closed.  Chaperone present for exam. BACK:  The back appears normal and is tender over the lumbar spine diffusely without step-off or deformity, no ecchymosis or swelling noted, no redness or warmth, no lesions noted EXT: Normal ROM in all joints; non-tender to palpation; no edema; normal capillary refill; no cyanosis, no calf tenderness or swelling    SKIN: Normal color for age and race; warm; no rash NEURO: Moves all extremities equally, sensation to light touch intact diffusely without saddle anesthesia, able to ambulate without difficulty but ambulation does appear to increase her pain PSYCH: The patient's mood and manner are appropriate. Grooming and personal hygiene are appropriate.  MEDICAL DECISION MAKING: Patient here with complaints of a month of lower abdominal pain and back pain.   States she was seen by her primary care physician this week and was referred to OB/GYN but is waiting for the referral.  She tells me that she thinks she may have endometriosis although she has never been diagnosed with this before.  It does appear in her chart that she has history of uterine fibroids.  She states she has had abnormal menstrual cycles for the past year and thought she was perimenopausal.  She is also concerned that she could be pregnant.  She has not taken a pregnancy test at home.  She is unable to tell me what changed today that made her more concerned that this was an emergency.  It seems mostly like she was tired of waiting for her referral.  She does state that she drove herself to the emergency department.  Will give Toradol for her discomfort and Zofran for nausea.  Will obtain labs, perform pelvic exam with cultures, obtain urinalysis and pregnancy test and transvaginal ultrasound with Doppler.  ED PROGRESS: On pelvic exam, patient does have moderate amount of yellow  appearing discharge coming from the cervix.  She has significant cervical motion tenderness and mild bilateral adnexal tenderness.  I am unable to appreciate a mass but exam is limited due to morbid obesity.  Will treat for PID.  She states she has had PID previously but does not remember if this feels similar.    Patient refuses Toradol.  States it causes hypertension.  Also reports doxycycline causes nausea and diarrhea.  Reports Rocephin causes her throat to swell.  I have talked to pharmacy regarding what medications would be helpful for this patient with concerns for PID.  ED pharmacist Mickel Baas recommends giving 1 g of azithromycin now and discharging with prescription of Levaquin 500 mg twice daily for 2 weeks.   Transvaginal ultrasound shows uterine fibroids.  Unable to visualize patient's ovaries.  Low suspicion that she has had ovarian torsion for a month.  Wet prep positive for clue cells.  We will also  discharge with Flagyl.  X-ray of the lumbar spine shows no acute abnormality.  Urine shows moderate leukocyte esterase but no other sign of infection.   We will discharge patient home with Levaquin, Flagyl, Zofran, Vicodin.  Will give outpatient OB/GYN follow-up information.  Discussed return precautions.  Patient comfortable with this plan.   At this time, I do not feel there is any life-threatening condition present. I have reviewed and discussed all results (EKG, imaging, lab, urine as appropriate) and exam findings with patient/family. I have reviewed nursing notes and appropriate previous records.  I feel the patient is safe to be discharged home without further emergent workup and can continue workup as an outpatient as needed. Discussed usual and customary return precautions. Patient/family verbalize understanding and are comfortable with this plan.  Outpatient follow-up has been provided as needed. All questions have been answered.    Emma Schupp, Delice Bison, DO 07/09/18 [phone]

## 2018-07-11 LAB — GC/CHLAMYDIA PROBE AMP (~~LOC~~) NOT AT ARMC
Chlamydia: NEGATIVE
Neisseria Gonorrhea: NEGATIVE

## 2019-02-27 ENCOUNTER — Encounter: Payer: Self-pay | Admitting: Emergency Medicine

## 2019-02-27 ENCOUNTER — Other Ambulatory Visit: Payer: Self-pay

## 2019-02-27 ENCOUNTER — Ambulatory Visit
Admission: EM | Admit: 2019-02-27 | Discharge: 2019-02-27 | Disposition: A | Discharge status: Home / Self Care | Payer: Medicare Other | Attending: Physician Assistant | Admitting: Physician Assistant

## 2019-02-27 DIAGNOSIS — Z20822 Contact with and (suspected) exposure to covid-19: Secondary | ICD-10-CM | POA: Diagnosis not present

## 2019-02-27 DIAGNOSIS — R0981 Nasal congestion: Secondary | ICD-10-CM | POA: Diagnosis not present

## 2019-02-27 DIAGNOSIS — J029 Acute pharyngitis, unspecified: Secondary | ICD-10-CM | POA: Diagnosis not present

## 2019-02-27 MED ORDER — LIDOCAINE VISCOUS HCL 2 % MT SOLN: OROMUCOSAL | 0 refills | Status: DC

## 2019-02-27 MED ORDER — AZELASTINE HCL 0.1 % NA SOLN: 2.0000 | Freq: Two times a day (BID) | NASAL | 0 refills | Status: DC

## 2019-02-27 NOTE — ED Triage Notes (Signed)
Pt presents to [facility] for assessment of after a strong sneeze on Friday which caused a "pop" in her throat that caused a sore spot in her throat, which seems to have spread and worsened.  Also c/o mild nasal congestion, and c.o mild cough with a hx of asthma.

## 2019-02-27 NOTE — Discharge Instructions (Signed)
COVID PCR testing ordered. I would like you to quarantine until testing results. Start lidocaine for sore throat, do not eat or drink for the next 40 mins after use as it can stunt your gag reflex. Flonase and azelastine as directed. Tylenol/motrin for pain and fever. Keep hydrated, urine should be clear to pale yellow in color. If experiencing shortness of breath, trouble breathing, go to the emergency department for further evaluation needed.

## 2019-02-27 NOTE — ED Notes (Signed)
Patient able to ambulate independently  

## 2019-02-27 NOTE — ED Provider Notes (Signed)
[facility]    CSN: [id number] Arrival date & time: 02/27/19  [place]      History   Chief Complaint Chief Complaint  Patient presents with  . Sore Throat    HPI Jiyah Teachman is a 54 y.o. female.   54 year old female comes in for 3-4 day of URI symptoms. States sneezed really hard 5 days ago and felt some sore throat. However, this has continued, and has had some nasal congestion. Denies fever, chills, body aches. Denies abdominal pain, nausea, vomiting, diarrhea. Denies shortness of breath, loss of taste/smell. She has history of asthma, and uses inhalers daily, denies increase use.      Past Medical History:  Diagnosis Date  . Depressed   . Fibroids   . GERD (gastroesophageal reflux disease)   . Hypertension   . Obesity     Patient Active Problem List   Diagnosis Date Noted  . Asthmatic bronchitis 09/03/2016  . SUI (stress urinary incontinence, female) 02/06/2016  . Dysmenorrhea 12/25/2015  . Abnormal uterine bleeding (AUB) 12/25/2015  . Perimenopausal 12/25/2015  . Metatarsal deformity 11/09/2014  . Pain, foot 02/07/2013  . Equinus deformity of foot, acquired 02/07/2013  . Tenosynovitis of foot and ankle 02/07/2013    Past Surgical History:  Procedure Laterality Date  . DILATION AND CURETTAGE OF UTERUS  09/2006  . TUBAL LIGATION      OB History    Gravida  17   Para  7   Term  6   Preterm  1   AB  3   Living  7     SAB  2   TAB      Ectopic  1   Multiple  0   Live Births  7            Home Medications    Prior to Admission medications   Medication Sig Start Date End Date Taking? Authorizing Provider  acetaminophen (TYLENOL) 500 MG tablet Take 500 mg by mouth as needed.    [provider]  albuterol (PROVENTIL HFA;VENTOLIN HFA) 108 (90 Base) MCG/ACT inhaler Inhale 2 puffs into the lungs every 6 (six) hours as needed for wheezing or shortness of breath. 04/28/18   Katy Apo, NP  aspirin EC 81 MG tablet  Take 81 mg by mouth daily.    [provider]  azelastine (ASTELIN) 0.1 % nasal spray Place 2 sprays into both nostrils 2 (two) times daily. 02/27/19   Tasia Catchings, Amy V, PA-C  budesonide-formoterol (SYMBICORT) 80-4.5 MCG/ACT inhaler Inhale 2 puffs into the lungs daily. 05/09/17   Jaynee Eagles, PA-C  cetirizine (ZYRTEC ALLERGY) 10 MG tablet Take 1 tablet (10 mg total) by mouth daily. 04/28/18   Katy Apo, NP  fluticasone (FLONASE) 50 MCG/ACT nasal spray Place 1 spray into both nostrils daily as needed for allergies. 04/28/18   Katy Apo, NP  lidocaine (XYLOCAINE) 2 % solution 5-15 mL gurgle as needed 02/27/19   Tasia Catchings, Amy V, PA-C  metoprolol succinate (TOPROL-XL) 25 MG 24 hr tablet Take 25 mg by mouth daily. 08/04/16   [provider]    Family History Family History  Problem Relation Age of Onset  . Heart failure Mother     Social History Social History   Tobacco Use  . Smoking status: Never Smoker  . Smokeless tobacco: Never Used  Substance Use Topics  . Alcohol use: Yes    Alcohol/week: 0.0 standard drinks    Comment: occasional-wine  .  Drug use: No     Allergies   Rocephin [ceftriaxone], Darvocet [propoxyphene n-acetaminophen], Darvon [propoxyphene hcl], Doxycycline, Percocet [oxycodone-acetaminophen], Stadol [butorphanol], and Toradol [ketorolac tromethamine]   Review of Systems Review of Systems  Reason unable to perform ROS: See HPI as above.     Physical Exam Triage Vital Signs ED Triage Vitals [02/27/19 1842]  Enc Vitals Group     BP 136/85     Pulse Rate 67     Resp 16     Temp 98.2 F (36.8 C)     Temp Source Temporal     SpO2 97 %     Weight      Height      Head Circumference      Peak Flow      Pain Score 8     Pain Loc      Pain Edu?      Excl. in [place]?    No data found.  Updated Vital Signs BP 136/85 (BP Location: Left Arm)   Pulse 67   Temp 98.2 F (36.8 C) (Temporal)   Resp 16   SpO2 97%   Physical Exam  Constitutional:      General: She is not in acute distress.    Appearance: Normal appearance. She is not ill-appearing, toxic-appearing or diaphoretic.  HENT:     Head: Normocephalic and atraumatic.     Right Ear: Tympanic membrane, ear canal and external ear normal. Tympanic membrane is not erythematous or bulging.     Left Ear: Tympanic membrane, ear canal and external ear normal. Tympanic membrane is not erythematous or bulging.     Mouth/Throat:     Mouth: Mucous membranes are moist.     Pharynx: Oropharynx is clear. Uvula midline.  Cardiovascular:     Rate and Rhythm: Normal rate and regular rhythm.     Heart sounds: Normal heart sounds. No murmur. No friction rub. No gallop.   Pulmonary:     Effort: Pulmonary effort is normal. No accessory muscle usage, prolonged expiration, respiratory distress or retractions.     Comments: Lungs clear to auscultation without adventitious lung sounds. Musculoskeletal:     Cervical back: Normal range of motion and neck supple.  Neurological:     General: No focal deficit present.     Mental Status: She is alert and oriented to person, place, and time.      UC Treatments / Results  Labs (all labs ordered are listed, but only abnormal results are displayed) Labs Reviewed  NOVEL CORONAVIRUS, NAA    EKG   Radiology No results found.  Procedures Procedures (including critical care time)  Medications Ordered in UC Medications - No data to display  Initial Impression / Assessment and Plan / UC Course  I have reviewed the triage vital signs and the nursing notes.  Pertinent labs & imaging results that were available during my care of the patient were reviewed by me and considered in my medical decision making (see chart for details).    COVID PCR test ordered. Patient to quarantine until testing results return. No alarming signs on exam.  Patient speaking in full sentences without respiratory distress.  Symptomatic treatment  discussed.  Push fluids.  Return precautions given.  Patient expresses understanding and agrees to plan.  Final Clinical Impressions(s) / UC Diagnoses   Final diagnoses:  Sore throat  Nasal congestion   ED Prescriptions    Medication Sig Dispense Auth. Provider   azelastine (ASTELIN) 0.1 % nasal  spray Place 2 sprays into both nostrils 2 (two) times daily. 30 mL Yu, Amy V, PA-C   lidocaine (XYLOCAINE) 2 % solution 5-15 mL gurgle as needed 150 mL Ok Edwards, PA-C     PDMP not reviewed this encounter.   Ok Edwards, PA-C 02/27/19 1927

## 2019-03-01 LAB — NOVEL CORONAVIRUS, NAA: SARS-CoV-2, NAA: NOT DETECTED

## 2019-03-02 ENCOUNTER — Telehealth: Payer: Self-pay | Admitting: Emergency Medicine

## 2019-03-02 NOTE — Telephone Encounter (Signed)
Attempted to return call from patient for lab results.  Left voicemail encouraging return call.

## 2019-03-02 NOTE — Telephone Encounter (Signed)
Received return call from patient for COVID results.  Confirmed identity using two identifiers and provided negative result.  Patient to come in tomorrow to sign medical release form and get print out of negative result.

## 2019-04-07 ENCOUNTER — Ambulatory Visit (INDEPENDENT_AMBULATORY_CARE_PROVIDER_SITE_OTHER): Payer: Medicare Other

## 2019-04-07 ENCOUNTER — Ambulatory Visit
Admission: EM | Admit: 2019-04-07 | Discharge: 2019-04-07 | Disposition: A | Discharge status: Home / Self Care | Payer: Medicare Other | Attending: Emergency Medicine | Admitting: Emergency Medicine

## 2019-04-07 DIAGNOSIS — M25561 Pain in right knee: Secondary | ICD-10-CM

## 2019-04-07 DIAGNOSIS — M25531 Pain in right wrist: Secondary | ICD-10-CM

## 2019-04-07 DIAGNOSIS — W19XXXA Unspecified fall, initial encounter: Secondary | ICD-10-CM

## 2019-04-07 DIAGNOSIS — M25511 Pain in right shoulder: Secondary | ICD-10-CM

## 2019-04-07 DIAGNOSIS — M79605 Pain in left leg: Secondary | ICD-10-CM | POA: Diagnosis not present

## 2019-04-07 MED ORDER — PREDNISONE 50 MG PO TABS
60.0000 mg | ORAL_TABLET | Freq: Once | ORAL | Status: AC
  Administered 2019-04-07: 60 mg via ORAL

## 2019-04-07 NOTE — Discharge Instructions (Addendum)
Recommend RICE: rest, ice, compression, elevation as needed for pain.    Heat therapy (hot compress, warm wash red, hot showers, etc.) can help relax muscles and soothe muscle aches. Cold therapy (ice packs) can be used to help swelling both after injury and after prolonged use of areas of chronic pain/aches.  For pain: recommend 350 mg-1000 mg of Tylenol (acetaminophen) and/or 200 mg - 800 mg of Advil (ibuprofen, Motrin) every 8 hours as needed.  May alternate between the two throughout the day as they are generally safe to take together.  DO NOT exceed more than 3000 mg of Tylenol or 3200 mg of ibuprofen in a 24 hour period as this could damage your stomach, kidneys, liver, or increase your bleeding risk.  Return for pain, numbness, tingling

## 2019-04-07 NOTE — ED Triage Notes (Signed)
Pt states she fell trying to get something out of her car around 1am this morning. States slipped on mud and fell forward with her rt hand on the concrete and lt leg going behind her. Pt c/o rt should/arm/hand pain and lt inner thigh pain. Denies LOC. States unable to work today.

## 2019-04-07 NOTE — ED Provider Notes (Signed)
[facility]    CSN: [id number] Arrival date & time: 04/07/19  1530      History   Chief Complaint Chief Complaint  Patient presents with  . Fall    HPI Sarah Mathews is a 54 y.o. female history of obesity, hypertension presenting for right shoulder pain, right wrist pain, right knee pain, left upper leg pain s/p fall around 1 AM this morning.  Was trying to get out of her car when she slipped in the mud and fell forward.  Reached out her right arm to catch herself.  States pain is worse in her right shoulder with some decreased range of motion secondary pain.  No numbness, tingling, weakness in any of her extremities.  Denies head trauma, LOC.  Has not taken anything for pain.  States pain is worse when she was lifting her hands up to type which caused her to call out from work today.    Past Medical History:  Diagnosis Date  . Depressed   . Fibroids   . GERD (gastroesophageal reflux disease)   . Hypertension   . Obesity     Patient Active Problem List   Diagnosis Date Noted  . Asthmatic bronchitis 09/03/2016  . SUI (stress urinary incontinence, female) 02/06/2016  . Dysmenorrhea 12/25/2015  . Abnormal uterine bleeding (AUB) 12/25/2015  . Perimenopausal 12/25/2015  . Metatarsal deformity 11/09/2014  . Pain, foot 02/07/2013  . Equinus deformity of foot, acquired 02/07/2013  . Tenosynovitis of foot and ankle 02/07/2013    Past Surgical History:  Procedure Laterality Date  . DILATION AND CURETTAGE OF UTERUS  09/2006  . TUBAL LIGATION      OB History    Gravida  17   Para  7   Term  6   Preterm  1   AB  3   Living  7     SAB  2   TAB      Ectopic  1   Multiple  0   Live Births  7            Home Medications    Prior to Admission medications   Medication Sig Start Date End Date Taking? Authorizing Provider  acetaminophen (TYLENOL) 500 MG tablet Take 500 mg by mouth as needed.    [provider]  albuterol (PROVENTIL  HFA;VENTOLIN HFA) 108 (90 Base) MCG/ACT inhaler Inhale 2 puffs into the lungs every 6 (six) hours as needed for wheezing or shortness of breath. 04/28/18   Katy Apo, NP  aspirin EC 81 MG tablet Take 81 mg by mouth daily.    [provider]  azelastine (ASTELIN) 0.1 % nasal spray Place 2 sprays into both nostrils 2 (two) times daily. 02/27/19   Tasia Catchings, Amy V, PA-C  budesonide-formoterol (SYMBICORT) 80-4.5 MCG/ACT inhaler Inhale 2 puffs into the lungs daily. 05/09/17   Jaynee Eagles, PA-C  cetirizine (ZYRTEC ALLERGY) 10 MG tablet Take 1 tablet (10 mg total) by mouth daily. 04/28/18   Katy Apo, NP  fluticasone (FLONASE) 50 MCG/ACT nasal spray Place 1 spray into both nostrils daily as needed for allergies. 04/28/18   Katy Apo, NP  metoprolol succinate (TOPROL-XL) 25 MG 24 hr tablet Take 25 mg by mouth daily. 08/04/16   [provider]    Family History Family History  Problem Relation Age of Onset  . Heart failure Mother     Social History Social History   Tobacco Use  . Smoking status: Never Smoker  .  Smokeless tobacco: Never Used  Substance Use Topics  . Alcohol use: Yes    Alcohol/week: 0.0 standard drinks    Comment: occasional-wine  . Drug use: No     Allergies   Rocephin [ceftriaxone], Darvocet [propoxyphene n-acetaminophen], Darvon [propoxyphene hcl], Doxycycline, Percocet [oxycodone-acetaminophen], Stadol [butorphanol], and Toradol [ketorolac tromethamine]   Review of Systems As per HPI   Physical Exam Triage Vital Signs ED Triage Vitals  Enc Vitals Group     BP      Pulse      Resp      Temp      Temp src      SpO2      Weight      Height      Head Circumference      Peak Flow      Pain Score      Pain Loc      Pain Edu?      Excl. in [place]?    No data found.  Updated Vital Signs BP 135/81 (BP Location: Left Arm)   Pulse 64   Temp 98.4 F (36.9 C) (Oral)   Resp 18   SpO2 97%   Visual Acuity Right Eye Distance:   Left  Eye Distance:   Bilateral Distance:    Right Eye Near:   Left Eye Near:    Bilateral Near:     Physical Exam Constitutional:      General: She is not in acute distress.    Appearance: She is obese. She is not ill-appearing or diaphoretic.  HENT:     Head: Normocephalic and atraumatic.     Mouth/Throat:     Mouth: Mucous membranes are moist.     Pharynx: Oropharynx is clear.  Eyes:     General: No scleral icterus.    Conjunctiva/sclera: Conjunctivae normal.     Pupils: Pupils are equal, round, and reactive to light.  Cardiovascular:     Rate and Rhythm: Normal rate.  Pulmonary:     Effort: Pulmonary effort is normal. No respiratory distress.     Breath sounds: No wheezing.  Musculoskeletal:     Right shoulder: Tenderness and bony tenderness present. No crepitus. Decreased range of motion. Decreased strength.     Left shoulder: Normal.     Right elbow: Normal.     Left elbow: Normal.     Right wrist: Tenderness present. No deformity, bony tenderness, snuff box tenderness or crepitus. Normal range of motion. Normal pulse.     Left wrist: Normal.     Cervical back: Normal range of motion and neck supple. No tenderness.     Right upper leg: Normal.     Left upper leg: Tenderness present. No swelling, deformity or bony tenderness.     Right knee: No deformity, effusion, erythema, ecchymosis, bony tenderness or crepitus. Normal range of motion. Tenderness present. No patellar tendon tenderness. Normal alignment and normal patellar mobility.     Left knee: Normal.       Legs:     Comments: Shoulder with distal clavicular and AC joint tenderness.  No crepitus with ROM.  Patient has near active ROM, full passive ROM, though endorsing pain.  Skin:    Capillary Refill: Capillary refill takes less than 2 seconds.     Coloration: Skin is not jaundiced or pale.     Findings: No bruising.  Neurological:     General: No focal deficit present.     Mental Status: She is alert and  oriented  to person, place, and time.      UC Treatments / Results  Labs (all labs ordered are listed, but only abnormal results are displayed) Labs Reviewed - No data to display  EKG   Radiology DG Clavicle Right  Result Date: 04/07/2019 CLINICAL DATA:  RIGHT distal clavicle pain EXAM: RIGHT CLAVICLE - 2+ VIEWS COMPARISON:  None. FINDINGS: Acromioclavicular joint appears normal. No fracture dislocation. No abnormality shoulder joint identified. IMPRESSION: No clavicle shoulder fracture identified.  No dislocation Electronically Signed   By: Suzy Bouchard M.D.   On: 04/07/2019 16:36    Procedures Procedures (including critical care time)  Medications Ordered in UC Medications  predniSONE (DELTASONE) tablet 60 mg (60 mg Oral Given 04/07/19 1658)    Initial Impression / Assessment and Plan / UC Course  I have reviewed the triage vital signs and the nursing notes.  Pertinent labs & imaging results that were available during my care of the patient were reviewed by me and considered in my medical decision making (see chart for details).     Patient nontoxic, hemodynamically stable in office.  Patient having most pain in right shoulder, particularly over distal clavicle and AC joint.  Right clavicle film obtained in office, reviewed by me and radiology: Negative for fracture, dislocation, normal joint spaces.  Reviewed findings with patient who verbalized understanding.  Will treat supportively as outlined below.  Patient given 60 mg prednisone in office which he tolerated well.  Return precautions discussed, patient verbalized understanding and is agreeable to plan. Final Clinical Impressions(s) / UC Diagnoses   Final diagnoses:  Fall, initial encounter  Acute pain of right shoulder  Left leg pain  Acute pain of right knee  Right wrist pain     Discharge Instructions     Recommend RICE: rest, ice, compression, elevation as needed for pain.    Heat therapy (hot compress, warm wash  red, hot showers, etc.) can help relax muscles and soothe muscle aches. Cold therapy (ice packs) can be used to help swelling both after injury and after prolonged use of areas of chronic pain/aches.  For pain: recommend 350 mg-1000 mg of Tylenol (acetaminophen) and/or 200 mg - 800 mg of Advil (ibuprofen, Motrin) every 8 hours as needed.  May alternate between the two throughout the day as they are generally safe to take together.  DO NOT exceed more than 3000 mg of Tylenol or 3200 mg of ibuprofen in a 24 hour period as this could damage your stomach, kidneys, liver, or increase your bleeding risk.  Return for pain, numbness, tingling    ED Prescriptions    None     I have reviewed the PDMP during this encounter.   Hall[place], [place], [place] 04/07/19 2000

## 2019-06-02 DIAGNOSIS — E559 Vitamin D deficiency, unspecified: Secondary | ICD-10-CM | POA: Diagnosis not present

## 2019-06-02 DIAGNOSIS — I10 Essential (primary) hypertension: Secondary | ICD-10-CM | POA: Diagnosis not present

## 2019-06-02 DIAGNOSIS — E785 Hyperlipidemia, unspecified: Secondary | ICD-10-CM | POA: Diagnosis not present

## 2019-06-02 DIAGNOSIS — R002 Palpitations: Secondary | ICD-10-CM | POA: Diagnosis not present

## 2019-06-02 DIAGNOSIS — F17290 Nicotine dependence, other tobacco product, uncomplicated: Secondary | ICD-10-CM | POA: Diagnosis not present

## 2019-06-02 DIAGNOSIS — J45909 Unspecified asthma, uncomplicated: Secondary | ICD-10-CM | POA: Diagnosis not present

## 2019-06-30 DIAGNOSIS — M5441 Lumbago with sciatica, right side: Secondary | ICD-10-CM | POA: Diagnosis not present

## 2019-06-30 DIAGNOSIS — M5442 Lumbago with sciatica, left side: Secondary | ICD-10-CM | POA: Diagnosis not present

## 2019-08-07 DIAGNOSIS — Z1211 Encounter for screening for malignant neoplasm of colon: Secondary | ICD-10-CM | POA: Diagnosis not present

## 2019-08-07 DIAGNOSIS — I10 Essential (primary) hypertension: Secondary | ICD-10-CM | POA: Diagnosis not present

## 2019-08-07 DIAGNOSIS — D509 Iron deficiency anemia, unspecified: Secondary | ICD-10-CM | POA: Diagnosis not present

## 2019-08-07 DIAGNOSIS — Z1239 Encounter for other screening for malignant neoplasm of breast: Secondary | ICD-10-CM | POA: Diagnosis not present

## 2019-08-07 DIAGNOSIS — Z0000 Encounter for general adult medical examination without abnormal findings: Secondary | ICD-10-CM | POA: Diagnosis not present

## 2019-08-30 ENCOUNTER — Ambulatory Visit
Admission: EM | Admit: 2019-08-30 | Discharge: 2019-08-30 | Disposition: A | Discharge status: Home / Self Care | Payer: Medicare Other

## 2019-08-30 DIAGNOSIS — T162XXA Foreign body in left ear, initial encounter: Secondary | ICD-10-CM

## 2019-08-30 DIAGNOSIS — H938X2 Other specified disorders of left ear: Secondary | ICD-10-CM

## 2019-08-30 NOTE — Discharge Instructions (Addendum)
No more Q-tips!  May use a half water, half peroxide solution to clean ears 2-3 times per week  Follow up with primary care or with this office as needed

## 2019-08-30 NOTE — ED Triage Notes (Signed)
Pt states the cotton from a q-tip is stuck in her lt ear since 10am.

## 2019-08-30 NOTE — ED Provider Notes (Signed)
[facility]   [id number] 08/30/19 Arrival Time: 1101  CC: EAR PAIN  SUBJECTIVE: History from: patient.  Sarah Mathews is a 54 y.o. female who presents with of left ear fullness since last night. Reports that she was cleaning her ears with a Q tip and that she thinks that the end of the Q tip got stuck in her ear. Reports that this has occurred in the past. Denies fever, chills, fatigue, sinus pain, rhinorrhea, ear discharge, sore throat, SOB, wheezing, chest pain, nausea, changes in bowel or bladder habits.    ROS: As per HPI.  All other pertinent ROS negative.     Past Medical History:  Diagnosis Date  . Depressed   . Fibroids   . GERD (gastroesophageal reflux disease)   . Hypertension   . Obesity    Past Surgical History:  Procedure Laterality Date  . DILATION AND CURETTAGE OF UTERUS  09/2006  . TUBAL LIGATION     Allergies  Allergen Reactions  . Rocephin [Ceftriaxone] Anaphylaxis  . Darvocet [Propoxyphene N-Acetaminophen] Other (See Comments)    In high amount heart palpitations  . Darvon [Propoxyphene Hcl]   . Doxycycline Diarrhea  . Percocet [Oxycodone-Acetaminophen] Nausea And Vomiting  . Stadol [Butorphanol] Other (See Comments)    delierious  . Toradol [Ketorolac Tromethamine] Hypertension   No current facility-administered medications on file prior to encounter.   Current Outpatient Medications on File Prior to Encounter  Medication Sig Dispense Refill  . acetaminophen (TYLENOL) 500 MG tablet Take 500 mg by mouth as needed.    [name] albuterol (PROVENTIL HFA;VENTOLIN HFA) 108 (90 Base) MCG/ACT inhaler Inhale 2 puffs into the lungs every 6 (six) hours as needed for wheezing or shortness of breath. 1 Inhaler 2  . aspirin EC 81 MG tablet Take 81 mg by mouth daily.    [name] azelastine (ASTELIN) 0.1 % nasal spray Place 2 sprays into both nostrils 2 (two) times daily. 30 mL 0  . budesonide-formoterol (SYMBICORT) 80-4.5 MCG/ACT inhaler Inhale 2 puffs into the lungs  daily. 1 Inhaler 1  . cetirizine (ZYRTEC ALLERGY) 10 MG tablet Take 1 tablet (10 mg total) by mouth daily. 30 tablet 2  . fluticasone (FLONASE) 50 MCG/ACT nasal spray Place 1 spray into both nostrils daily as needed for allergies. 16 g 2  . metoprolol succinate (TOPROL-XL) 25 MG 24 hr tablet Take 25 mg by mouth daily.  3   Social History   Socioeconomic History  . Marital status: Single    Spouse name: Not on file  . Number of children: Not on file  . Years of education: Not on file  . Highest education level: Not on file  Occupational History  . Not on file  Tobacco Use  . Smoking status: Never Smoker  . Smokeless tobacco: Never Used  Substance and Sexual Activity  . Alcohol use: Yes    Alcohol/week: 0.0 standard drinks    Comment: occasional-wine  . Drug use: No  . Sexual activity: Yes    Birth control/protection: Surgical  Other Topics Concern  . Not on file  Social History Narrative  . Not on file   Social Determinants of Health   Financial Resource Strain:   . Difficulty of Paying Living Expenses:   Food Insecurity:   . Worried About Charity fundraiser in the Last Year:   . Arboriculturist in the Last Year:   Transportation Needs:   . Film/video editor (Medical):   [name] Lack of  Transportation (Non-Medical):   Physical Activity:   . Days of Exercise per Week:   . Minutes of Exercise per Session:   Stress:   . Feeling of Stress :   Social Connections:   . Frequency of Communication with Friends and Family:   . Frequency of Social Gatherings with Friends and Family:   . Attends Religious Services:   . Active Member of Clubs or Organizations:   . Attends Archivist Meetings:   [name] Marital Status:   Intimate Partner Violence:   . Fear of Current or Ex-Partner:   . Emotionally Abused:   [name] Physically Abused:   . Sexually Abused:    Family History  Problem Relation Age of Onset  . Heart failure Mother     OBJECTIVE:  Vitals:   08/30/19 1136  08/30/19 1152  BP: 136/82   Pulse: 65   Resp:  18  Temp: 98.3 F (36.8 C)   TempSrc: Oral   SpO2: 95%      General appearance: alert; appears fatigued HEENT: Ears: L EAC with foreign body in canal, R EAC clear, TMs pearly gray with visible cone of light, without erythema; Eyes: PERRL, EOMI grossly; Sinuses nontender to palpation; Nose: clear rhinorrhea; Throat: oropharynx mildly erythematous, tonsils 1+ without white tonsillar exudates, uvula midline Neck: supple without LAD Lungs: unlabored respirations, symmetrical air entry; cough: absent; no respiratory distress Heart: regular rate and rhythm.  Radial pulses 2+ symmetrical bilaterally Skin: warm and dry Psychological: alert and cooperative; normal mood and affect  Imaging: No results found.   ASSESSMENT & PLAN:  1. Sensation of fullness in left ear   2. Foreign body of left ear, initial encounter     Rest and drink plenty of fluids Removed the end of a q-tip from left ear Do not clean ears with Q-tips Use solution of 1/2 peroxide, 1/2 water to clean ears Continue to use OTC ibuprofen and/ or tylenol as needed for pain control Follow up with PCP if symptoms persists Return here or go to the ER if you have any new or worsening symptoms   Reviewed expectations re: course of current medical issues. Questions answered. Outlined signs and symptoms indicating need for more acute intervention. Patient verbalized understanding. After Visit Summary given.          Faustino Congress, NP 08/30/19 1307

## 2019-09-26 ENCOUNTER — Ambulatory Visit
Admission: EM | Admit: 2019-09-26 | Discharge: 2019-09-26 | Disposition: A | Discharge status: Home / Self Care | Payer: Medicare Other | Attending: Physician Assistant | Admitting: Physician Assistant

## 2019-09-26 ENCOUNTER — Other Ambulatory Visit: Payer: Self-pay

## 2019-09-26 DIAGNOSIS — J3489 Other specified disorders of nose and nasal sinuses: Secondary | ICD-10-CM | POA: Diagnosis not present

## 2019-09-26 DIAGNOSIS — Z1152 Encounter for screening for COVID-19: Secondary | ICD-10-CM | POA: Diagnosis not present

## 2019-09-26 DIAGNOSIS — R0789 Other chest pain: Secondary | ICD-10-CM | POA: Diagnosis not present

## 2019-09-26 MED ORDER — DEXAMETHASONE SODIUM PHOSPHATE 10 MG/ML IJ SOLN
10.0000 mg | Freq: Once | INTRAMUSCULAR | Status: AC
  Administered 2019-09-26: 10 mg via INTRAMUSCULAR

## 2019-09-26 MED ORDER — TIZANIDINE HCL 2 MG PO TABS: 2.0000 mg | ORAL_TABLET | Freq: Three times a day (TID) | ORAL | 0 refills | Status: DC | PRN

## 2019-09-26 NOTE — Discharge Instructions (Signed)
COVID PCR testing ordered. I would like you to quarantine until testing results. Decadron injection in office today. Tizanidine as needed, this can make you drowsy, so do not take if you are going to drive, operate heavy machinery, or make important decisions. Continue flonase. Follow-up with PCP for further evaluation if symptoms not improving. If significant worsening of headache, nausea/vomiting, fever, confusion, passing out, go to the emergency department for further evaluation.

## 2019-09-26 NOTE — ED Provider Notes (Signed)
[facility]    CSN: [id number] Arrival date & time: 09/26/19  1937      History   Chief Complaint Chief Complaint  Patient presents with  . Headache    HPI Sarah Mathews is a 54 y.o. female.   54 year old female comes in for 3 day history of headache. Headache to the whole head, but also pressure behind the eye. No nausea, vomiting, photophobia, phonophobia. Denies URI symptoms. Denies shortness of breath, loss of taste/smell.   Also complaints of right sided chest pain x 2 days. States was doing heavier lifting and wonders if this could be causing it. Pain worse with arm movement.      Past Medical History:  Diagnosis Date  . Depressed   . Fibroids   . GERD (gastroesophageal reflux disease)   . Hypertension   . Obesity     Patient Active Problem List   Diagnosis Date Noted  . Asthmatic bronchitis 09/03/2016  . SUI (stress urinary incontinence, female) 02/06/2016  . Dysmenorrhea 12/25/2015  . Abnormal uterine bleeding (AUB) 12/25/2015  . Perimenopausal 12/25/2015  . Metatarsal deformity 11/09/2014  . Pain, foot 02/07/2013  . Equinus deformity of foot, acquired 02/07/2013  . Tenosynovitis of foot and ankle 02/07/2013    Past Surgical History:  Procedure Laterality Date  . DILATION AND CURETTAGE OF UTERUS  09/2006  . TUBAL LIGATION      OB History    Gravida  17   Para  7   Term  6   Preterm  1   AB  3   Living  7     SAB  2   TAB      Ectopic  1   Multiple  0   Live Births  7            Home Medications    Prior to Admission medications   Medication Sig Start Date End Date Taking? Authorizing Provider  acetaminophen (TYLENOL) 500 MG tablet Take 500 mg by mouth as needed.    [provider]  albuterol (PROVENTIL HFA;VENTOLIN HFA) 108 (90 Base) MCG/ACT inhaler Inhale 2 puffs into the lungs every 6 (six) hours as needed for wheezing or shortness of breath. 04/28/18   Katy Apo, NP  aspirin EC 81 MG tablet  Take 81 mg by mouth daily.    [provider]  azelastine (ASTELIN) 0.1 % nasal spray Place 2 sprays into both nostrils 2 (two) times daily. 02/27/19   Tasia Catchings, Joselin Crandell V, PA-C  budesonide-formoterol (SYMBICORT) 80-4.5 MCG/ACT inhaler Inhale 2 puffs into the lungs daily. 05/09/17   Jaynee Eagles, PA-C  cetirizine (ZYRTEC ALLERGY) 10 MG tablet Take 1 tablet (10 mg total) by mouth daily. 04/28/18   Katy Apo, NP  fluticasone (FLONASE) 50 MCG/ACT nasal spray Place 1 spray into both nostrils daily as needed for allergies. 04/28/18   Katy Apo, NP  metoprolol succinate (TOPROL-XL) 25 MG 24 hr tablet Take 25 mg by mouth daily. 08/04/16   [provider]  tiZANidine (ZANAFLEX) 2 MG tablet Take 1 tablet (2 mg total) by mouth every 8 (eight) hours as needed for muscle spasms. 09/26/19   Ok Edwards, PA-C    Family History Family History  Problem Relation Age of Onset  . Heart failure Mother     Social History Social History   Tobacco Use  . Smoking status: Never Smoker  . Smokeless tobacco: Never Used  Substance Use Topics  . Alcohol  use: Yes    Alcohol/week: 0.0 standard drinks    Comment: occasional-wine  . Drug use: No     Allergies   Rocephin [ceftriaxone], Darvocet [propoxyphene n-acetaminophen], Darvon [propoxyphene hcl], Doxycycline, Percocet [oxycodone-acetaminophen], Stadol [butorphanol], and Toradol [ketorolac tromethamine]   Review of Systems Review of Systems  Reason unable to perform ROS: See HPI as above.     Physical Exam Triage Vital Signs ED Triage Vitals [09/26/19 1956]  Enc Vitals Group     BP (!) 143/81     Pulse Rate 64     Resp 18     Temp 98.4 F (36.9 C)     Temp Source Oral     SpO2 100 %     Weight      Height      Head Circumference      Peak Flow      Pain Score 7     Pain Loc      Pain Edu?      Excl. in [place]?    No data found.  Updated Vital Signs BP (!) 143/81 (BP Location: Left Arm)   Pulse 64   Temp 98.4 F (36.9 C)  (Oral)   Resp 18   LMP 03/09/2016   SpO2 100%   Physical Exam Constitutional:      General: She is not in acute distress.    Appearance: Normal appearance. She is well-developed. She is not ill-appearing, toxic-appearing or diaphoretic.  HENT:     Head: Normocephalic and atraumatic.     Right Ear: Tympanic membrane, ear canal and external ear normal. Tympanic membrane is not erythematous or bulging.     Left Ear: Tympanic membrane, ear canal and external ear normal. Tympanic membrane is not erythematous or bulging.     Nose:     Right Sinus: Maxillary sinus tenderness and frontal sinus tenderness present.     Left Sinus: Maxillary sinus tenderness and frontal sinus tenderness present.     Mouth/Throat:     Mouth: Mucous membranes are moist.     Pharynx: Oropharynx is clear. Uvula midline.  Eyes:     Extraocular Movements: Extraocular movements intact.     Conjunctiva/sclera: Conjunctivae normal.     Pupils: Pupils are equal, round, and reactive to light.  Cardiovascular:     Rate and Rhythm: Normal rate and regular rhythm.  Pulmonary:     Effort: Pulmonary effort is normal. No accessory muscle usage, prolonged expiration, respiratory distress or retractions.     Breath sounds: No decreased air movement or transmitted upper airway sounds. No decreased breath sounds.     Comments: LCTAB Chest:     Comments: Diffuse right sided chest wall tenderness Musculoskeletal:     Cervical back: Normal range of motion and neck supple.     Comments: Full ROM of BUE. Strength 5/5. Sensation intact.   Skin:    General: Skin is warm and dry.  Neurological:     Mental Status: She is alert and oriented to person, place, and time.     Comments: Grossly intact without focal deficits. Ambulating on own without difficulty.       UC Treatments / Results  Labs (all labs ordered are listed, but only abnormal results are displayed) Labs Reviewed  NOVEL CORONAVIRUS, NAA    EKG   Radiology No  results found.  Procedures Procedures (including critical care time)  Medications Ordered in UC Medications  dexamethasone (DECADRON) injection 10 mg (has no administration in time range)  Initial Impression / Assessment and Plan / UC Course  I have reviewed the triage vital signs and the nursing notes.  Pertinent labs & imaging results that were available during my care of the patient were reviewed by me and considered in my medical decision making (see chart for details).     1. Headache/sinus pressure.  Sinus pressure on exam. No signs of bacterial sinusitis. No significant URI symptoms. Patient requesting COVID testing, ordered. Neurology exam grossly intact without focal deficits. Will continue to monitor. Return precautions given.  2. Right sided chest wall pain Patient states was told not to take NSAIDs by cardiology due to metoprolol use. Tizanidine as needed. Return precautions given.  Final Clinical Impressions(s) / UC Diagnoses   Final diagnoses:  Encounter for screening for COVID-19  Sinus pressure  Right-sided chest wall pain   ED Prescriptions    Medication Sig Dispense Auth. Provider   tiZANidine (ZANAFLEX) 2 MG tablet Take 1 tablet (2 mg total) by mouth every 8 (eight) hours as needed for muscle spasms. 15 tablet Ok Edwards, PA-C     PDMP not reviewed this encounter.   Ok Edwards, PA-C 09/26/19 2017

## 2019-09-26 NOTE — ED Triage Notes (Signed)
Pt c/o headache since Sunday with pressure behind eyes. Pt c/o rt center to upper chest muscle pain since yesterday after lifter a laundry basket.

## 2019-09-28 LAB — SARS-COV-2, NAA 2 DAY TAT

## 2019-09-28 LAB — NOVEL CORONAVIRUS, NAA: SARS-CoV-2, NAA: NOT DETECTED

## 2019-10-04 ENCOUNTER — Other Ambulatory Visit: Payer: Self-pay | Admitting: Physician Assistant

## 2019-10-04 DIAGNOSIS — Z1231 Encounter for screening mammogram for malignant neoplasm of breast: Secondary | ICD-10-CM

## 2019-11-22 ENCOUNTER — Ambulatory Visit: Payer: Medicare Other

## 2020-01-14 ENCOUNTER — Encounter (HOSPITAL_COMMUNITY): Payer: Self-pay

## 2020-01-14 ENCOUNTER — Emergency Department (HOSPITAL_COMMUNITY): Payer: Medicare Other

## 2020-01-14 ENCOUNTER — Other Ambulatory Visit: Payer: Self-pay

## 2020-01-14 ENCOUNTER — Emergency Department (HOSPITAL_COMMUNITY)
Admission: EM | Admit: 2020-01-14 | Discharge: 2020-01-14 | Disposition: A | Discharge status: Home / Self Care | Payer: Medicare Other | Attending: Emergency Medicine | Admitting: Emergency Medicine

## 2020-01-14 DIAGNOSIS — Z7982 Long term (current) use of aspirin: Secondary | ICD-10-CM | POA: Insufficient documentation

## 2020-01-14 DIAGNOSIS — Z79899 Other long term (current) drug therapy: Secondary | ICD-10-CM | POA: Diagnosis not present

## 2020-01-14 DIAGNOSIS — J45909 Unspecified asthma, uncomplicated: Secondary | ICD-10-CM | POA: Insufficient documentation

## 2020-01-14 DIAGNOSIS — Z7951 Long term (current) use of inhaled steroids: Secondary | ICD-10-CM | POA: Diagnosis not present

## 2020-01-14 DIAGNOSIS — I10 Essential (primary) hypertension: Secondary | ICD-10-CM | POA: Insufficient documentation

## 2020-01-14 DIAGNOSIS — R1032 Left lower quadrant pain: Secondary | ICD-10-CM | POA: Diagnosis not present

## 2020-01-14 LAB — COMPREHENSIVE METABOLIC PANEL
ALT: 11 U/L (ref 0–44)
AST: 14 U/L — ABNORMAL LOW (ref 15–41)
Albumin: 4.3 g/dL (ref 3.5–5.0)
Alkaline Phosphatase: 84 U/L (ref 38–126)
Anion gap: 9 (ref 5–15)
BUN: 12 mg/dL (ref 6–20)
CO2: 26 mmol/L (ref 22–32)
Calcium: 9 mg/dL (ref 8.9–10.3)
Chloride: 103 mmol/L (ref 98–111)
Creatinine, Ser: 1.08 mg/dL — ABNORMAL HIGH (ref 0.44–1.00)
GFR, Estimated: >60 mL/min (ref >60)
Glucose, Bld: 107 mg/dL — ABNORMAL HIGH (ref 70–99)
Potassium: 3.8 mmol/L (ref 3.5–5.1)
Sodium: 138 mmol/L (ref 135–145)
Total Bilirubin: 0.3 mg/dL (ref 0.3–1.2)
Total Protein: 8.4 g/dL — ABNORMAL HIGH (ref 6.5–8.1)

## 2020-01-14 LAB — I-STAT BETA HCG BLOOD, ED (MC, WL, AP ONLY): I-stat hCG, quantitative: <5 m[IU]/mL (ref <5)

## 2020-01-14 LAB — URINALYSIS, ROUTINE W REFLEX MICROSCOPIC
Bacteria, UA: NONE SEEN
Bilirubin Urine: NEGATIVE
Glucose, UA: NEGATIVE mg/dL
Hgb urine dipstick: NEGATIVE
Ketones, ur: NEGATIVE mg/dL
Nitrite: NEGATIVE
Protein, ur: NEGATIVE mg/dL
Specific Gravity, Urine: 1.025 (ref 1.005–1.030)
pH: 6 (ref 5.0–8.0)

## 2020-01-14 LAB — CBC
HCT: 31.6 % — ABNORMAL LOW (ref 36.0–46.0)
Hemoglobin: 9.5 g/dL — ABNORMAL LOW (ref 12.0–15.0)
MCH: 24.8 pg — ABNORMAL LOW (ref 26.0–34.0)
MCHC: 30.1 g/dL (ref 30.0–36.0)
MCV: 82.5 fL (ref 80.0–100.0)
Platelets: 301 10*3/uL (ref 150–400)
RBC: 3.83 MIL/uL — ABNORMAL LOW (ref 3.87–5.11)
RDW: 15.4 % (ref 11.5–15.5)
WBC: 5.8 10*3/uL (ref 4.0–10.5)
nRBC: 0 % (ref 0.0–0.2)

## 2020-01-14 LAB — LIPASE, BLOOD: Lipase: 30 U/L (ref 11–51)

## 2020-01-14 LAB — POC OCCULT BLOOD, ED: Fecal Occult Bld: POSITIVE — AB

## 2020-01-14 MED ORDER — METRONIDAZOLE 500 MG PO TABS
500.0000 mg | ORAL_TABLET | Freq: Once | ORAL | Status: AC
  Administered 2020-01-14: 500 mg via ORAL
  Filled 2020-01-14: qty 1

## 2020-01-14 MED ORDER — ONDANSETRON 4 MG PO TBDP: 4.0000 mg | ORAL_TABLET | Freq: Three times a day (TID) | ORAL | 0 refills | Status: DC | PRN

## 2020-01-14 MED ORDER — CIPROFLOXACIN HCL 500 MG PO TABS: 500.0000 mg | ORAL_TABLET | Freq: Two times a day (BID) | ORAL | 0 refills | Status: AC

## 2020-01-14 MED ORDER — METRONIDAZOLE 500 MG PO TABS: 500.0000 mg | ORAL_TABLET | Freq: Three times a day (TID) | ORAL | 0 refills | Status: AC

## 2020-01-14 MED ORDER — LORAZEPAM 0.5 MG PO TABS
0.5000 mg | ORAL_TABLET | Freq: Once | ORAL | Status: DC
  Filled 2020-01-14: qty 1

## 2020-01-14 MED ORDER — CIPROFLOXACIN HCL 500 MG PO TABS
500.0000 mg | ORAL_TABLET | Freq: Once | ORAL | Status: AC
  Administered 2020-01-14: 500 mg via ORAL
  Filled 2020-01-14: qty 1

## 2020-01-14 MED ORDER — IOHEXOL 300 MG/ML  SOLN
100.0000 mL | Freq: Once | INTRAMUSCULAR | Status: AC | PRN
  Administered 2020-01-14: 100 mL via INTRAVENOUS

## 2020-01-14 NOTE — ED Provider Notes (Signed)
[facility] DEPT Provider Note   CSN: [id number] Arrival date & time: 01/14/20  1643     History Chief Complaint  Patient presents with  . Abdominal Pain  . Rectal Bleeding    Sarah Mathews is a 54 y.o. female with past medical history significant for fibroids, GERD, hypertension, obesity.  Surgical history includes tubal ligation and D&C.  HPI Patient presents to emergency room today with chief complaint of left lower quadrant abdominal pain x1 week.  She states the pain has been intermittent.  She describes as a cramping sensation.  Pain does not radiate.  She rates the pain 3 out of 10 in severity.  She states she has had the feeling of constipation for the last week as well.  She had a bowel movement this morning that had bright red blood in it.  She became concerned and came to the emergency department.  She denies taking any over-the-counter medications for symptoms prior to arrival.  She does also endorse history of hemorrhoids. She denies any fever, chills, nausea, emesis, pelvic pain, v abnormal vaginal bleeding, vaginal discharge, urinary frequency, gross hematuria, dysuria, black tarry stool, anal trauma.  Past Medical History:  Diagnosis Date  . Depressed   . Fibroids   . GERD (gastroesophageal reflux disease)   . Hypertension   . Obesity     Patient Active Problem List   Diagnosis Date Noted  . Asthmatic bronchitis 09/03/2016  . SUI (stress urinary incontinence, female) 02/06/2016  . Dysmenorrhea 12/25/2015  . Abnormal uterine bleeding (AUB) 12/25/2015  . Perimenopausal 12/25/2015  . Metatarsal deformity 11/09/2014  . Pain, foot 02/07/2013  . Equinus deformity of foot, acquired 02/07/2013  . Tenosynovitis of foot and ankle 02/07/2013    Past Surgical History:  Procedure Laterality Date  . DILATION AND CURETTAGE OF UTERUS  09/2006  . TUBAL LIGATION       OB History    Gravida  54   Para  7   Term  6   Preterm  1   AB    3   Living  7     SAB  2   TAB      Ectopic  1   Multiple  0   Live Births  7           Family History  Problem Relation Age of Onset  . Heart failure Mother     Social History   Tobacco Use  . Smoking status: Never Smoker  . Smokeless tobacco: Never Used  Substance Use Topics  . Alcohol use: Yes    Alcohol/week: 0.0 standard drinks    Comment: occasional-wine  . Drug use: No    Home Medications Prior to Admission medications   Medication Sig Start Date End Date Taking? Authorizing Provider  acetaminophen (TYLENOL) 500 MG tablet Take 500 mg by mouth as needed.    [provider]  albuterol (PROVENTIL HFA;VENTOLIN HFA) 108 (90 Base) MCG/ACT inhaler Inhale 2 puffs into the lungs every 6 (six) hours as needed for wheezing or shortness of breath. 04/28/18   Katy Apo, NP  aspirin EC 81 MG tablet Take 81 mg by mouth daily.    [provider]  azelastine (ASTELIN) 0.1 % nasal spray Place 2 sprays into both nostrils 2 (two) times daily. 02/27/19   Tasia Catchings, Amy V, PA-C  budesonide-formoterol (SYMBICORT) 80-4.5 MCG/ACT inhaler Inhale 2 puffs into the lungs daily. 05/09/17   Jaynee Eagles, PA-C  cetirizine (ZYRTEC ALLERGY)  10 MG tablet Take 1 tablet (10 mg total) by mouth daily. 04/28/18   Katy Apo, NP  ciprofloxacin (CIPRO) 500 MG tablet Take 1 tablet (500 mg total) by mouth every 12 (twelve) hours for 10 days. 01/15/20 01/25/20  Walisiewicz, Verline Lema E, PA-C  fluticasone (FLONASE) 50 MCG/ACT nasal spray Place 1 spray into both nostrils daily as needed for allergies. 04/28/18   Katy Apo, NP  metoprolol succinate (TOPROL-XL) 25 MG 24 hr tablet Take 25 mg by mouth daily. 08/04/16   [provider]  metroNIDAZOLE (FLAGYL) 500 MG tablet Take 1 tablet (500 mg total) by mouth 3 (three) times daily for 10 days. 01/15/20 01/25/20  Walisiewicz, Verline Lema E, PA-C  ondansetron (ZOFRAN ODT) 4 MG disintegrating tablet Take 1 tablet (4 mg total) by mouth  every 8 (eight) hours as needed for nausea or vomiting. 01/14/20   Sherol Dade E, PA-C    Allergies    Rocephin [ceftriaxone], Darvocet [propoxyphene n-acetaminophen], Darvon [propoxyphene hcl], Doxycycline, Percocet [oxycodone-acetaminophen], Stadol [butorphanol], and Toradol [ketorolac tromethamine]  Review of Systems   Review of Systems All other systems are reviewed and are negative for acute change except as noted in the HPI.  Physical Exam Updated Vital Signs BP (!) 159/83 (BP Location: Left Arm)   Pulse 98   Temp 98.1 F (36.7 C) (Oral)   Resp 18   LMP 03/09/2016   SpO2 100%   Physical Exam Vitals and nursing note reviewed.  Constitutional:      General: She is not in acute distress.    Appearance: She is obese. She is not ill-appearing.  HENT:     Head: Normocephalic and atraumatic.     Right Ear: Tympanic membrane and external ear normal.     Left Ear: Tympanic membrane and external ear normal.     Nose: Nose normal.     Mouth/Throat:     Mouth: Mucous membranes are moist.     Pharynx: Oropharynx is clear.  Eyes:     General: No scleral icterus.       Right eye: No discharge.        Left eye: No discharge.     Extraocular Movements: Extraocular movements intact.     Conjunctiva/sclera: Conjunctivae normal.     Pupils: Pupils are equal, round, and reactive to light.  Neck:     Vascular: No JVD.  Cardiovascular:     Rate and Rhythm: Normal rate and regular rhythm.     Pulses: Normal pulses.          Radial pulses are 2+ on the right side and 2+ on the left side.     Heart sounds: Normal heart sounds.  Pulmonary:     Comments: Lungs clear to auscultation in all fields. Symmetric chest rise. No wheezing, rales, or rhonchi. Abdominal:     Comments: Abdomen is soft, non-distended, tender to palpation of left lower quadrant. No rigidity, no guarding. No peritoneal signs.  Genitourinary:    Comments: Chaperone Melissa NT present for exam. Digital Rectal  Exam reveals sphincter with good tone.  Multiple small external hemorrhoids without evidence of thrombosis. No masses or fissures. Stool color is brown with no overt blood. No gross melena.  Musculoskeletal:        General: Normal range of motion.     Cervical back: Normal range of motion.  Skin:    General: Skin is warm and dry.     Capillary Refill: Capillary refill takes less than 2 seconds.  Neurological:     Mental Status: She is oriented to person, place, and time.     GCS: GCS eye subscore is 4. GCS verbal subscore is 5. GCS motor subscore is 6.     Comments: Fluent speech, no facial droop.  Psychiatric:        Behavior: Behavior normal.     ED Results / Procedures / Treatments   Labs (all labs ordered are listed, but only abnormal results are displayed) Labs Reviewed  COMPREHENSIVE METABOLIC PANEL - Abnormal; Notable for the following components:      Result Value   Glucose, Bld 107 (*)    Creatinine, Ser 1.08 (*)    Total Protein 8.4 (*)    AST 14 (*)    All other components within normal limits  CBC - Abnormal; Notable for the following components:   RBC 3.83 (*)    Hemoglobin 9.5 (*)    HCT 31.6 (*)    MCH 24.8 (*)    All other components within normal limits  URINALYSIS, ROUTINE W REFLEX MICROSCOPIC - Abnormal; Notable for the following components:   APPearance HAZY (*)    Leukocytes,Ua MODERATE (*)    All other components within normal limits  POC OCCULT BLOOD, ED - Abnormal; Notable for the following components:   Fecal Occult Bld POSITIVE (*)    All other components within normal limits  LIPASE, BLOOD  I-STAT BETA HCG BLOOD, ED ([facility], [facility], [facility] ONLY)    EKG None  Radiology No results found.  Procedures Procedures (including critical care time)  Medications Ordered in ED Medications  iohexol (OMNIPAQUE) 300 MG/ML solution 100 mL (100 mLs Intravenous Contrast Given 01/14/20 2013)  ciprofloxacin (CIPRO) tablet 500 mg (500 mg Oral Given 01/14/20 2106)    metroNIDAZOLE (FLAGYL) tablet 500 mg (500 mg Oral Given 01/14/20 2106)    ED Course  I have reviewed the triage vital signs and the nursing notes.  Pertinent labs & imaging results that were available during my care of the patient were reviewed by me and considered in my medical decision making (see chart for details).    MDM Rules/Calculators/A&P                          History provided by patient with additional history obtained from chart review.    Patient presents to the ED with complaints of abdominal pain. Patient nontoxic appearing, in no apparent distress, vitals WNL On exam patient tender to left lower quadrant, no peritoneal signs.  Rectal exam performed with chaperone present.  Patient has external hemorrhoids without thrombosis.  No gross melena.  Fecal occult positive. Will evaluate with labs and CT abdomen pelvis.  Patient declines need for analgesics, anti-emetics, and fluids.  Labs reviewed and grossly unremarkable. No leukocytosis, hemoglobin today is 9.5 Chart review shows baseline hemoglobin ranges from 9-10.  No significant electrolyte derangements, slight bump in creatinine at 1.08, BUN normal.  Lipase within normal range UA without signs of infection.  Patient did not want to have CT scanning because of her severe claustrophobia. Offered patient medication to calm her down however she would rather follow-up with her primary care doctor. Clinically she has diverticulitis. Will treat with PO antibiotics. On repeat abdominal exam patient remains without peritoneal signs, doubt cholecystitis, pancreatitis, diverticulitis, appendicitis, bowel obstruction/perforation. Patient tolerating PO in the emergency department.    I discussed results, treatment plan, need for PCP follow-up, and return precautions with the patient. Provided  opportunity for questions, patient confirmed understanding and is in agreement with plan.     Portions of this note were generated with Geographical information systems officer. Dictation errors may occur despite best attempts at proofreading.   Final Clinical Impression(s) / ED Diagnoses Final diagnoses:  Left lower quadrant abdominal pain    Rx / DC Orders ED Discharge Orders         Ordered    ciprofloxacin (CIPRO) 500 MG tablet  Every 12 hours        01/14/20 2103    metroNIDAZOLE (FLAGYL) 500 MG tablet  3 times daily        01/14/20 2103    ondansetron (ZOFRAN ODT) 4 MG disintegrating tablet  Every 8 hours PRN        01/14/20 2103           Barrie Folk, PA-C 01/14/20 2109    Lajean Saver, MD 01/15/20 [phone]

## 2020-01-14 NOTE — ED Notes (Signed)
Pt reports that she cannot take Ativan because benzodiazepines make her "jittery".  Provider notified at this time.  Will continue to monitor.

## 2020-01-14 NOTE — Discharge Instructions (Addendum)
-  Two prescriptions were sent to your pharmacy. Take them as prescribed. Do not drink alcohol while taking metronidazole as it will make you have uncontrollable vomiting. Take medications with food so they do not upset her stomach. -Prescription for Zofran was also sent to your pharmacy. This is for nausea. Take if needed.  Based on your exam today it is likely that you have diverticulitis. However I am unable to know for sure since you did not have the CT scan. You should follow-up with your primary care doctor to further discuss your abdominal pain.  Return to the emergency department for any new or worsening symptoms.

## 2020-01-14 NOTE — ED Triage Notes (Signed)
Patient reports to the ER for abdominal pain and passing blood clots in her stool. Patient denies a hx of diverticulitis.

## 2020-01-14 NOTE — ED Notes (Signed)
Pt. Was stuck for labs but unsuccessful. Pt. Wants to wait for a nurse to put in a IV instead of being stuck again. Nurse aware.

## 2020-02-07 ENCOUNTER — Other Ambulatory Visit: Payer: Self-pay

## 2020-02-07 ENCOUNTER — Ambulatory Visit (INDEPENDENT_AMBULATORY_CARE_PROVIDER_SITE_OTHER): Payer: Medicare Other

## 2020-02-07 ENCOUNTER — Ambulatory Visit
Admission: EM | Admit: 2020-02-07 | Discharge: 2020-02-07 | Disposition: A | Discharge status: Home / Self Care | Payer: Medicare Other | Attending: Internal Medicine | Admitting: Internal Medicine

## 2020-02-07 DIAGNOSIS — R6889 Other general symptoms and signs: Secondary | ICD-10-CM

## 2020-02-07 DIAGNOSIS — R059 Cough, unspecified: Secondary | ICD-10-CM | POA: Diagnosis not present

## 2020-02-07 DIAGNOSIS — Z20822 Contact with and (suspected) exposure to covid-19: Secondary | ICD-10-CM | POA: Diagnosis not present

## 2020-02-07 DIAGNOSIS — R509 Fever, unspecified: Secondary | ICD-10-CM | POA: Diagnosis not present

## 2020-02-07 DIAGNOSIS — Z1152 Encounter for screening for COVID-19: Secondary | ICD-10-CM

## 2020-02-07 MED ORDER — OSELTAMIVIR PHOSPHATE 75 MG PO CAPS: 75.0000 mg | ORAL_CAPSULE | Freq: Two times a day (BID) | ORAL | 0 refills | Status: DC

## 2020-02-07 MED ORDER — BENZONATATE 200 MG PO CAPS: 200.0000 mg | ORAL_CAPSULE | Freq: Two times a day (BID) | ORAL | 0 refills | Status: DC | PRN

## 2020-02-07 NOTE — ED Provider Notes (Signed)
[facility]    CSN: [id number] Arrival date & time: 02/07/20  1425      History   Chief Complaint Chief Complaint  Patient presents with  . Generalized Body Aches  . Headache  . Cough    HPI Sarah Mathews is a 54 y.o. female who presents with body aches, cough, fever and HA since yesterday. Has felt hot and cold like she has a fever, but has not checked it. Her side ribs have beewen sore.     Past Medical History:  Diagnosis Date  . Depressed   . Fibroids   . GERD (gastroesophageal reflux disease)   . Hypertension   . Obesity     Patient Active Problem List   Diagnosis Date Noted  . Asthmatic bronchitis 09/03/2016  . SUI (stress urinary incontinence, female) 02/06/2016  . Dysmenorrhea 12/25/2015  . Abnormal uterine bleeding (AUB) 12/25/2015  . Perimenopausal 12/25/2015  . Metatarsal deformity 11/09/2014  . Pain, foot 02/07/2013  . Equinus deformity of foot, acquired 02/07/2013  . Tenosynovitis of foot and ankle 02/07/2013    Past Surgical History:  Procedure Laterality Date  . DILATION AND CURETTAGE OF UTERUS  09/2006  . TUBAL LIGATION      OB History    Gravida  17   Para  7   Term  6   Preterm  1   AB  3   Living  7     SAB  2   IAB      Ectopic  1   Multiple  0   Live Births  7            Home Medications    Prior to Admission medications   Medication Sig Start Date End Date Taking? Authorizing Provider  benzonatate (TESSALON) 200 MG capsule Take 1 capsule (200 mg total) by mouth 2 (two) times daily as needed for cough. 02/07/20  Yes Rodriguez-Southworth, Sunday Spillers, PA-C  oseltamivir (TAMIFLU) 75 MG capsule Take 1 capsule (75 mg total) by mouth every 12 (twelve) hours. 02/07/20  Yes Rodriguez-Southworth, Sunday Spillers, PA-C  acetaminophen (TYLENOL) 500 MG tablet Take 500 mg by mouth as needed.    [provider]  albuterol (PROVENTIL HFA;VENTOLIN HFA) 108 (90 Base) MCG/ACT inhaler Inhale 2 puffs into the lungs  every 6 (six) hours as needed for wheezing or shortness of breath. 04/28/18   Katy Apo, NP  aspirin EC 81 MG tablet Take 81 mg by mouth daily.    [provider]  azelastine (ASTELIN) 0.1 % nasal spray Place 2 sprays into both nostrils 2 (two) times daily. 02/27/19   Tasia Catchings, Amy V, PA-C  budesonide-formoterol (SYMBICORT) 80-4.5 MCG/ACT inhaler Inhale 2 puffs into the lungs daily. 05/09/17   Jaynee Eagles, PA-C  cetirizine (ZYRTEC ALLERGY) 10 MG tablet Take 1 tablet (10 mg total) by mouth daily. 04/28/18   Katy Apo, NP  fluticasone (FLONASE) 50 MCG/ACT nasal spray Place 1 spray into both nostrils daily as needed for allergies. 04/28/18   Katy Apo, NP  metoprolol succinate (TOPROL-XL) 25 MG 24 hr tablet Take 25 mg by mouth daily. 08/04/16   [provider]  ondansetron (ZOFRAN ODT) 4 MG disintegrating tablet Take 1 tablet (4 mg total) by mouth every 8 (eight) hours as needed for nausea or vomiting. 01/14/20   Barrie Folk, PA-C    Family History Family History  Problem Relation Age of Onset  . Heart failure Mother     Social History Social  History   Tobacco Use  . Smoking status: Never Smoker  . Smokeless tobacco: Never Used  Substance Use Topics  . Alcohol use: Yes  . Drug use: No     Allergies   Rocephin [ceftriaxone], Buspirone, Butorphanol, Oxycodone-acetaminophen, Propoxyphene, Darvocet [propoxyphene n-acetaminophen], Darvon [propoxyphene hcl], Percocet [oxycodone-acetaminophen], Toradol [ketorolac tromethamine], and Doxycycline   Review of Systems Review of Systems  Constitutional: Positive for appetite change, chills, fatigue and fever. Negative for activity change.  HENT: Positive for postnasal drip and rhinorrhea. Negative for congestion, ear discharge, ear pain, sore throat, trouble swallowing and voice change.   Eyes: Negative for discharge.  Respiratory: Positive for cough. Negative for chest tightness, shortness of breath and  wheezing.   Cardiovascular: Negative for chest pain.  Gastrointestinal: Negative for abdominal pain, diarrhea, nausea and vomiting.  Musculoskeletal: Positive for myalgias.  Skin: Negative for rash.  Neurological: Positive for headaches.  Hematological: Negative for adenopathy.     Physical Exam Triage Vital Signs ED Triage Vitals  Enc Vitals Group     BP 02/07/20 1833 127/79     Pulse Rate 02/07/20 1833 74     Resp 02/07/20 1833 (!) 22     Temp 02/07/20 1833 (!) 100.7 F (38.2 C)     Temp Source 02/07/20 1833 Oral     SpO2 02/07/20 1833 95 %     Weight --      Height --      Head Circumference --      Peak Flow --      Pain Score 02/07/20 1830 0     Pain Loc --      Pain Edu? --      Excl. in [place]? --    No data found.  Updated Vital Signs BP 127/79 (BP Location: Right Arm)   Pulse 74   Temp (!) 100.7 F (38.2 C) (Oral)   Resp (!) 22   LMP 03/09/2016   SpO2 95%   Visual Acuity Right Eye Distance:   Left Eye Distance:   Bilateral Distance:    Right Eye Near:   Left Eye Near:    Bilateral Near:     Physical Exam Physical Exam Vitals signs and nursing note reviewed.  Constitutional:      General: She is not in acute distress.    Appearance: Normal appearance. She is not ill-appearing, toxic-appearing or diaphoretic.  HENT:     Head: Normocephalic.     Right Ear: Tympanic membrane, ear canal and external ear normal.     Left Ear: Tympanic membrane, ear canal and external ear normal.     Nose: Nose normal.     Mouth/Throat:     Mouth: Mucous membranes are moist.  Eyes:     General: No scleral icterus.       Right eye: No discharge.        Left eye: No discharge.     Conjunctiva/sclera: Conjunctivae normal.  Neck:     Musculoskeletal: Neck supple. No neck rigidity.  Cardiovascular:     Rate and Rhythm: Normal rate and regular rhythm.     Heart sounds: No murmur.  Pulmonary:     Effort: Pulmonary effort is normal.     Breath sounds: Normal breath  sounds.   Musculoskeletal: Normal range of motion.  Lymphadenopathy:     Cervical: No cervical adenopathy.  Skin:    General: Skin is warm and dry.     Coloration: Skin is not jaundiced.  Findings: No rash.  Neurological:     Mental Status: She is alert and oriented to person, place, and time.     Gait: Gait normal.  Psychiatric:        Mood and Affect: Mood normal.        Behavior: Behavior normal.        Thought Content: Thought content normal.        Judgment: Judgment normal.    UC Treatments / Results  Labs (all labs ordered are listed, but only abnormal results are displayed) Labs Reviewed  RESP PANEL BY RT-PCR (FLU A&B, COVID) [id number]    EKG   Radiology DG Chest 2 View  Result Date: 02/07/2020 CLINICAL DATA:  Cough and fever, COVID-19 exposure EXAM: CHEST - 2 VIEW COMPARISON:  02/09/2018 FINDINGS: Frontal and lateral views of the chest demonstrate a stable cardiac silhouette. Chronic scarring at the left lung base. No airspace disease, effusion, or pneumothorax. No acute bony abnormalities. IMPRESSION: 1. No acute intrathoracic process. Electronically Signed   By: Sharlet Salina M.D.   On: 02/07/2020 19:03    Procedures Procedures (including critical care time)  Medications Ordered in UC Medications - No data to display  Initial Impression / Assessment and Plan / UC Course  I have reviewed the triage vital signs and the nursing notes. Covid and flu are pending  Pertinent imaging results that were available during my care of the patient were reviewed by me and considered in my medical decision making (see chart for details).   Final Clinical Impressions(s) / UC Diagnoses   Final diagnoses:  Flu-like symptoms     Discharge Instructions     Your chest xray is normal, you just have rib soreness from the cough I am sending Tamiflu to cover for flu since it is best started within 48h of onset of symptoms, and cough medication. Take Tyelnol as needed for  the fever. Your covid test and flu wont be back for 2-3 days, check you mychart for results  Due to your chronic conditions and if your covid test is positive, you qualify to receive Monoclonal Antibody infusion and if you don't hear from anyone within 24 hours of finding your results, call [phone]     ED Prescriptions    Medication Sig Dispense Auth. Provider   oseltamivir (TAMIFLU) 75 MG capsule Take 1 capsule (75 mg total) by mouth every 12 (twelve) hours. 10 capsule Rodriguez-Southworth, Nettie Elm, PA-C   benzonatate (TESSALON) 200 MG capsule Take 1 capsule (200 mg total) by mouth 2 (two) times daily as needed for cough. 30 capsule Rodriguez-Southworth, Nettie Elm, PA-C     PDMP not reviewed this encounter.   Garey Ham, Cordelia Poche 02/07/20 2124

## 2020-02-07 NOTE — ED Triage Notes (Signed)
Pt is here with body aches a cough with fever/headache that started yesterday, pt has taken OTC meds to relieve discomfort.

## 2020-02-07 NOTE — Discharge Instructions (Signed)
Your chest xray is normal, you just have rib soreness from the cough I am sending Tamiflu to cover for flu since it is best started within 48h of onset of symptoms, and cough medication. Take Tylenol as needed for the fever. Your covid test and flu wont be back for 2-3 days, check you mychart for results  Due to your chronic conditions and if your covid test is positive, you qualify to receive Monoclonal Antibody infusion and if you don't hear from anyone within 24 hours of finding your results, call [phone]

## 2020-02-08 LAB — COVID-19, FLU A+B NAA
Influenza A, NAA: NOT DETECTED
Influenza B, NAA: NOT DETECTED
SARS-CoV-2, NAA: DETECTED — AB

## 2020-02-29 ENCOUNTER — Ambulatory Visit
Admission: EM | Admit: 2020-02-29 | Discharge: 2020-02-29 | Disposition: A | Discharge status: Home / Self Care | Payer: Medicare Other | Attending: Emergency Medicine | Admitting: Emergency Medicine

## 2020-02-29 ENCOUNTER — Other Ambulatory Visit: Payer: Self-pay

## 2020-02-29 DIAGNOSIS — R519 Headache, unspecified: Secondary | ICD-10-CM | POA: Diagnosis not present

## 2020-02-29 DIAGNOSIS — J029 Acute pharyngitis, unspecified: Secondary | ICD-10-CM

## 2020-02-29 DIAGNOSIS — Z20822 Contact with and (suspected) exposure to covid-19: Secondary | ICD-10-CM

## 2020-02-29 MED ORDER — CETIRIZINE HCL 10 MG PO TABS: 10.0000 mg | ORAL_TABLET | Freq: Every day | ORAL | 0 refills | Status: AC

## 2020-02-29 MED ORDER — FLUTICASONE PROPIONATE 50 MCG/ACT NA SUSP: 1.0000 | Freq: Every day | NASAL | 0 refills | Status: DC | PRN

## 2020-02-29 NOTE — ED Triage Notes (Signed)
Pt c/o headache and sore throat since Tuesday night. States needs a work note.

## 2020-02-29 NOTE — Discharge Instructions (Signed)
Please continue Tylenol and/or Ibuprofen as needed for fever, pain.  May try warm salt water gargles, cepacol lozenges, throat spray, warm tea or water with lemon/honey, or OTC cold relief medicine for throat discomfort.   For congestion: take a daily anti-histamine like Zyrtec, Claritin, and a oral decongestant to help with post nasal drip that may be irritating your throat.   It is important to stay hydrated: drink plenty of fluids (primarily water) to keep your throat moisturized and help further relieve irritation/discomfort. 

## 2020-02-29 NOTE — ED Provider Notes (Signed)
[facility]    CSN: [id number] Arrival date & time: 02/29/20  0909      History   Chief Complaint Chief Complaint  Patient presents with  . Headache     HPI Sarah Mathews is a 55 y.o. female  With history as below presenting for frontal headache and sore throat since Tuesday night.  Tolerating liquids, foods well.  No fever, cough, difficulty breathing.  Better than symptom onset.  Patient is s/p COVID-19 infection in December.  Recovered without sequela.  Past Medical History:  Diagnosis Date  . Depressed   . Fibroids   . GERD (gastroesophageal reflux disease)   . Hypertension   . Obesity     Patient Active Problem List   Diagnosis Date Noted  . Asthmatic bronchitis 09/03/2016  . SUI (stress urinary incontinence, female) 02/06/2016  . Dysmenorrhea 12/25/2015  . Abnormal uterine bleeding (AUB) 12/25/2015  . Perimenopausal 12/25/2015  . Metatarsal deformity 11/09/2014  . Pain, foot 02/07/2013  . Equinus deformity of foot, acquired 02/07/2013  . Tenosynovitis of foot and ankle 02/07/2013    Past Surgical History:  Procedure Laterality Date  . DILATION AND CURETTAGE OF UTERUS  09/2006  . TUBAL LIGATION      OB History    Gravida  17   Para  7   Term  6   Preterm  1   AB  3   Living  7     SAB  2   IAB      Ectopic  1   Multiple  0   Live Births  7            Home Medications    Prior to Admission medications   Medication Sig Start Date End Date Taking? Authorizing Provider  acetaminophen (TYLENOL) 500 MG tablet Take 500 mg by mouth as needed.    [provider]  albuterol (PROVENTIL HFA;VENTOLIN HFA) 108 (90 Base) MCG/ACT inhaler Inhale 2 puffs into the lungs every 6 (six) hours as needed for wheezing or shortness of breath. 04/28/18   Katy Apo, NP  aspirin EC 81 MG tablet Take 81 mg by mouth daily.    [provider]  azelastine (ASTELIN) 0.1 % nasal spray Place 2 sprays into both nostrils 2 (two)  times daily. 02/27/19   Tasia Catchings, Amy V, PA-C  budesonide-formoterol (SYMBICORT) 80-4.5 MCG/ACT inhaler Inhale 2 puffs into the lungs daily. 05/09/17   Jaynee Eagles, PA-C  cetirizine (ZYRTEC ALLERGY) 10 MG tablet Take 1 tablet (10 mg total) by mouth daily. 02/29/20   Hall-Potvin, [place], PA-C  fluticasone (FLONASE) 50 MCG/ACT nasal spray Place 1 spray into both nostrils daily as needed for allergies. 02/29/20   Hall-Potvin, [place], PA-C  metoprolol succinate (TOPROL-XL) 25 MG 24 hr tablet Take 25 mg by mouth daily. 08/04/16   [provider]    Family History Family History  Problem Relation Age of Onset  . Heart failure Mother     Social History Social History   Tobacco Use  . Smoking status: Never Smoker  . Smokeless tobacco: Never Used  Substance Use Topics  . Alcohol use: Yes  . Drug use: No     Allergies   Rocephin [ceftriaxone], Buspirone, Butorphanol, Oxycodone-acetaminophen, Propoxyphene, Darvocet [propoxyphene n-acetaminophen], Darvon [propoxyphene hcl], Percocet [oxycodone-acetaminophen], Toradol [ketorolac tromethamine], and Doxycycline   Review of Systems Review of Systems  Constitutional: Negative for fatigue and fever.  HENT: Positive for sore throat. Negative for congestion, dental problem, ear pain, facial  swelling, hearing loss, sinus pain, trouble swallowing and voice change.   Eyes: Negative for photophobia, pain and visual disturbance.  Respiratory: Negative for cough and shortness of breath.   Cardiovascular: Negative for chest pain and palpitations.  Gastrointestinal: Negative for diarrhea and vomiting.  Musculoskeletal: Negative for arthralgias and myalgias.  Neurological: Positive for headaches. Negative for dizziness.     Physical Exam Triage Vital Signs ED Triage Vitals [02/29/20 1023]  Enc Vitals Group     BP 137/63     Pulse Rate 68     Resp 18     Temp 98.3 F (36.8 C)     Temp Source Oral     SpO2 95 %     Weight      Height       Head Circumference      Peak Flow      Pain Score 4     Pain Loc      Pain Edu?      Excl. in [place]?    No data found.  Updated Vital Signs BP 137/63 (BP Location: Left Arm)   Pulse 68   Temp 98.3 F (36.8 C) (Oral)   Resp 18   LMP 03/09/2016   SpO2 95%   Visual Acuity Right Eye Distance:   Left Eye Distance:   Bilateral Distance:    Right Eye Near:   Left Eye Near:    Bilateral Near:     Physical Exam Constitutional:      General: She is not in acute distress.    Appearance: She is not ill-appearing or diaphoretic.  HENT:     Head: Normocephalic and atraumatic.     Right Ear: Tympanic membrane and ear canal normal.     Left Ear: Tympanic membrane and ear canal normal.     Mouth/Throat:     Mouth: Mucous membranes are moist.     Pharynx: Oropharynx is clear. No oropharyngeal exudate or posterior oropharyngeal erythema.  Eyes:     General: No scleral icterus.    Conjunctiva/sclera: Conjunctivae normal.     Pupils: Pupils are equal, round, and reactive to light.  Neck:     Comments: Trachea midline, negative JVD Cardiovascular:     Rate and Rhythm: Normal rate and regular rhythm.     Heart sounds: No murmur heard. No gallop.   Pulmonary:     Effort: Pulmonary effort is normal. No respiratory distress.     Breath sounds: No wheezing, rhonchi or rales.  Musculoskeletal:     Cervical back: Neck supple. No tenderness.  Lymphadenopathy:     Cervical: No cervical adenopathy.  Skin:    Capillary Refill: Capillary refill takes less than 2 seconds.     Coloration: Skin is not jaundiced or pale.     Findings: No rash.  Neurological:     General: No focal deficit present.     Mental Status: She is alert and oriented to person, place, and time.      UC Treatments / Results  Labs (all labs ordered are listed, but only abnormal results are displayed) Labs Reviewed  NOVEL CORONAVIRUS, NAA    EKG   Radiology No results found.  Procedures Procedures  (including critical care time)  Medications Ordered in UC Medications - No data to display  Initial Impression / Assessment and Plan / UC Course  I have reviewed the triage vital signs and the nursing notes.  Pertinent labs & imaging results that were available during my  care of the patient were reviewed by me and considered in my medical decision making (see chart for details).     Patient afebrile, nontoxic, with SpO2 95%.  Covid PCR pending.  Patient to quarantine until results are back.  We will treat supportively as outlined below.  Return precautions discussed, patient verbalized understanding and is agreeable to plan. Final Clinical Impressions(s) / UC Diagnoses   Final diagnoses:  Encounter for screening laboratory testing for COVID-19 virus  Frontal headache  Sore throat     Discharge Instructions     Please continue Tylenol and/or Ibuprofen as needed for fever, pain.  May try warm salt water gargles, cepacol lozenges, throat spray, warm tea or water with lemon/honey, or OTC cold relief medicine for throat discomfort.   For congestion: take a daily anti-histamine like Zyrtec, Claritin, and a oral decongestant to help with post nasal drip that may be irritating your throat.   It is important to stay hydrated: drink plenty of fluids (primarily water) to keep your throat moisturized and help further relieve irritation/discomfort.     ED Prescriptions    Medication Sig Dispense Auth. Provider   fluticasone (FLONASE) 50 MCG/ACT nasal spray Place 1 spray into both nostrils daily as needed for allergies. 16 g Hall-Potvin, [place], PA-C   cetirizine (ZYRTEC ALLERGY) 10 MG tablet Take 1 tablet (10 mg total) by mouth daily. 30 tablet Hall-Potvin, [place], PA-C     PDMP not reviewed this encounter.   Hall-Potvin, [place], [place] 02/29/20 1151

## 2020-03-02 LAB — NOVEL CORONAVIRUS, NAA: SARS-CoV-2, NAA: NOT DETECTED

## 2020-03-02 LAB — SARS-COV-2, NAA 2 DAY TAT

## 2020-05-07 DIAGNOSIS — I10 Essential (primary) hypertension: Secondary | ICD-10-CM | POA: Diagnosis not present

## 2020-05-07 DIAGNOSIS — E559 Vitamin D deficiency, unspecified: Secondary | ICD-10-CM | POA: Diagnosis not present

## 2020-05-07 DIAGNOSIS — R002 Palpitations: Secondary | ICD-10-CM | POA: Diagnosis not present

## 2020-05-07 DIAGNOSIS — J45909 Unspecified asthma, uncomplicated: Secondary | ICD-10-CM | POA: Diagnosis not present

## 2020-05-07 DIAGNOSIS — E785 Hyperlipidemia, unspecified: Secondary | ICD-10-CM | POA: Diagnosis not present

## 2020-06-05 DIAGNOSIS — R002 Palpitations: Secondary | ICD-10-CM | POA: Diagnosis not present

## 2020-07-30 ENCOUNTER — Ambulatory Visit
Admission: EM | Admit: 2020-07-30 | Discharge: 2020-07-30 | Disposition: A | Discharge status: Home / Self Care | Payer: Medicare Other | Attending: Emergency Medicine | Admitting: Emergency Medicine

## 2020-07-30 ENCOUNTER — Other Ambulatory Visit: Payer: Self-pay

## 2020-07-30 DIAGNOSIS — R21 Rash and other nonspecific skin eruption: Secondary | ICD-10-CM | POA: Insufficient documentation

## 2020-07-30 DIAGNOSIS — M545 Low back pain, unspecified: Secondary | ICD-10-CM | POA: Diagnosis not present

## 2020-07-30 DIAGNOSIS — R1032 Left lower quadrant pain: Secondary | ICD-10-CM | POA: Insufficient documentation

## 2020-07-30 LAB — POCT URINALYSIS DIP (MANUAL ENTRY)
Bilirubin, UA: NEGATIVE
Blood, UA: NEGATIVE
Glucose, UA: NEGATIVE mg/dL
Ketones, POC UA: NEGATIVE mg/dL
Leukocytes, UA: NEGATIVE
Nitrite, UA: NEGATIVE
Spec Grav, UA: >=1.03 — AB (ref 1.010–1.025)
Urobilinogen, UA: 1 E.U./dL
pH, UA: 6 (ref 5.0–8.0)

## 2020-07-30 MED ORDER — MUPIROCIN 2 % EX OINT: 1.0000 "application " | TOPICAL_OINTMENT | Freq: Two times a day (BID) | CUTANEOUS | 0 refills | Status: DC

## 2020-07-30 MED ORDER — CLOTRIMAZOLE-BETAMETHASONE 1-0.05 % EX CREA: TOPICAL_CREAM | CUTANEOUS | 0 refills | Status: DC

## 2020-07-30 MED ORDER — DICLOFENAC SODIUM 1 % EX GEL: 2.0000 g | Freq: Four times a day (QID) | CUTANEOUS | 0 refills | Status: DC

## 2020-07-30 MED ORDER — DICYCLOMINE HCL 20 MG PO TABS: 20.0000 mg | ORAL_TABLET | Freq: Three times a day (TID) | ORAL | 0 refills | Status: AC

## 2020-07-30 MED ORDER — ACETAMINOPHEN-CODEINE #3 300-30 MG PO TABS
1.0000 | ORAL_TABLET | Freq: Four times a day (QID) | ORAL | 0 refills | Status: DC | PRN
Start: 2020-07-30 — End: 2021-09-30

## 2020-07-30 NOTE — Discharge Instructions (Addendum)
Lotrisone twice daily to rash on ankle, also use Bactroban ointment twice daily  Diclofenac topically to back Tylenol 3 for severe pain-use sparingly  May try Bentyl before meals and bedtime to help with abdominal cramping  Please use Miralax for moderate to severe constipation. Take this once a day for the next 2-3 days. Please also start docusate stool softener, twice a day for at least 1 week. If stools become loose, cut down to once a day for another week. If stools remain loose, cut back to 1 pill every other day for a third week. You can stop docusate thereafter and resume as needed for constipation.  Daily metamucil supplement  To help reduce constipation and promote bowel health: 1. Drink at least 64 ounces of water each day 2. Eat plenty of fiber (fruits, vegetables, whole grains, legumes) 3. Be physically active or exercise including walking, jogging, swimming, yoga, etc. 4. For active constipation use a stool softener (docusate) or an osmotic laxative (like Miralax) each day, or as needed.

## 2020-07-30 NOTE — ED Triage Notes (Signed)
Pt c/o a round sore itchy ras to lt ankle for weeks.  Pt c/o lt flank pain x1wk. Denies urinary difficulties. States been having LLQ pain for a while and has a PCP appt 6/30.

## 2020-07-31 NOTE — ED Provider Notes (Signed)
[facility]    CSN: [id number] Arrival date & time: 07/30/20  1811      History   Chief Complaint Chief Complaint  Patient presents with   Rash   Flank Pain    HPI Sarah Mathews is a 55 y.o. female history of hypertension, GERD, presenting today for evaluation of rash to left ankle and abdominal/back pain.  Patient reports that she has had a dry area developing to her left outer ankle over the past month.  She has been applying Eucerin eczema cream without relief.  Denies history of similar or rashes elsewhere.  Associated with significant itching, slight tenderness.  Denies fevers.  Reports that there was some pustular drainage yesterday and was requesting antibiotic ointment.  Also reports that she has had abdominal discomfort on the left side x1 week.  Feels constipated, reports she is still having daily bowel movements but they are smaller harder and with increased straining.  Tried an enema without relief.  Also with associated left back pain, having difficulty getting comfortable.  She denies any urinary symptoms.  HPI  Past Medical History:  Diagnosis Date   Depressed    Fibroids    GERD (gastroesophageal reflux disease)    Hypertension    Obesity     Patient Active Problem List   Diagnosis Date Noted   Asthmatic bronchitis 09/03/2016   SUI (stress urinary incontinence, female) 02/06/2016   Dysmenorrhea 12/25/2015   Abnormal uterine bleeding (AUB) 12/25/2015   Perimenopausal 12/25/2015   Metatarsal deformity 11/09/2014   Pain, foot 02/07/2013   Equinus deformity of foot, acquired 02/07/2013   Tenosynovitis of foot and ankle 02/07/2013    Past Surgical History:  Procedure Laterality Date   DILATION AND CURETTAGE OF UTERUS  09/2006   TUBAL LIGATION      OB History     Gravida  17   Para  7   Term  6   Preterm  1   AB  3   Living  7      SAB  2   IAB      Ectopic  1   Multiple  0   Live Births  7            Home  Medications    Prior to Admission medications   Medication Sig Start Date End Date Taking? Authorizing Provider  acetaminophen-codeine (TYLENOL #3) 300-30 MG tablet Take 1-2 tablets by mouth every 6 (six) hours as needed for moderate pain or severe pain. 07/30/20  Yes Shahan Starks, [place] C, PA-C  clotrimazole-betamethasone (LOTRISONE) cream Apply to affected area 2 times daily 07/30/20  Yes Lahari Suttles C, PA-C  diclofenac Sodium (VOLTAREN) 1 % GEL Apply 2 g topically 4 (four) times daily. 07/30/20  Yes Verneal Wiers C, PA-C  dicyclomine (BENTYL) 20 MG tablet Take 1 tablet (20 mg total) by mouth 4 (four) times daily -  before meals and at bedtime. As needed for cramping 07/30/20  Yes Jamarrion Budai, [place] C, PA-C  mupirocin ointment (BACTROBAN) 2 % Apply 1 application topically 2 (two) times daily. 07/30/20  Yes Clebert Wenger C, PA-C  acetaminophen (TYLENOL) 500 MG tablet Take 500 mg by mouth as needed.    [provider]  albuterol (PROVENTIL HFA;VENTOLIN HFA) 108 (90 Base) MCG/ACT inhaler Inhale 2 puffs into the lungs every 6 (six) hours as needed for wheezing or shortness of breath. 04/28/18   Katy Apo, NP  aspirin EC 81 MG tablet Take 81 mg by mouth  daily.    [provider]  azelastine (ASTELIN) 0.1 % nasal spray Place 2 sprays into both nostrils 2 (two) times daily. 02/27/19   Tasia Catchings, Amy V, PA-C  budesonide-formoterol (SYMBICORT) 80-4.5 MCG/ACT inhaler Inhale 2 puffs into the lungs daily. 05/09/17   Jaynee Eagles, PA-C  cetirizine (ZYRTEC ALLERGY) 10 MG tablet Take 1 tablet (10 mg total) by mouth daily. 02/29/20   Hall-Potvin, [place], PA-C  fluticasone (FLONASE) 50 MCG/ACT nasal spray Place 1 spray into both nostrils daily as needed for allergies. 02/29/20   Hall-Potvin, [place], PA-C    Family History Family History  Problem Relation Age of Onset   Heart failure Mother     Social History Social History   Tobacco Use   Smoking status: Never   Smokeless tobacco: Never   Substance Use Topics   Alcohol use: Yes   Drug use: No     Allergies   Rocephin [ceftriaxone], Buspirone, Butorphanol, Oxycodone-acetaminophen, Propoxyphene, Darvocet [propoxyphene n-acetaminophen], Darvon [propoxyphene hcl], Percocet [oxycodone-acetaminophen], Toradol [ketorolac tromethamine], and Doxycycline   Review of Systems Review of Systems  Constitutional:  Negative for fatigue and fever.  HENT:  Negative for mouth sores.   Eyes:  Negative for visual disturbance.  Respiratory:  Negative for shortness of breath.   Cardiovascular:  Negative for chest pain.  Gastrointestinal:  Positive for abdominal pain and constipation. Negative for nausea and vomiting.  Genitourinary:  Negative for genital sores.  Musculoskeletal:  Positive for back pain. Negative for arthralgias and joint swelling.  Skin:  Positive for color change and rash. Negative for wound.  Neurological:  Negative for dizziness, weakness, light-headedness and headaches.    Physical Exam Triage Vital Signs ED Triage Vitals [07/30/20 1928]  Enc Vitals Group     BP 139/79     Pulse Rate (!) 59     Resp 18     Temp 98.3 F (36.8 C)     Temp Source Oral     SpO2 95 %     Weight      Height      Head Circumference      Peak Flow      Pain Score 10     Pain Loc      Pain Edu?      Excl. in [place]?    No data found.  Updated Vital Signs BP 139/79 (BP Location: Left Arm)   Pulse (!) 59   Temp 98.3 F (36.8 C) (Oral)   Resp 18   LMP 03/09/2016   SpO2 95%   Visual Acuity Right Eye Distance:   Left Eye Distance:   Bilateral Distance:    Right Eye Near:   Left Eye Near:    Bilateral Near:     Physical Exam Vitals and nursing note reviewed.  Constitutional:      Appearance: She is well-developed.     Comments: No acute distress  HENT:     Head: Normocephalic and atraumatic.     Nose: Nose normal.  Eyes:     Conjunctiva/sclera: Conjunctivae normal.  Cardiovascular:     Rate and Rhythm: Normal  rate and regular rhythm.  Pulmonary:     Effort: Pulmonary effort is normal. No respiratory distress.     Comments: Breathing comfortably at rest, CTABL, no wheezing, rales or other adventitious sounds auscultated   Abdominal:     General: There is no distension.     Comments: Soft, nondistended, tender to palpation to left upper and lower quadrant extending into  left flank, negative rebound, negative Rovsing  Musculoskeletal:        General: Normal range of motion.     Cervical back: Neck supple.     Comments: Left lower thoracic area with tenderness to palpation extending slightly into lumbar area  Skin:    General: Skin is warm and dry.     Comments: Left lateral malleolus with circular area of black/gray appearing dry skin and very faint surrounding reddish/purple discoloration just directly around the borders, no erythema extending outward, no warmth, no induration or fluctuance  Neurological:     Mental Status: She is alert and oriented to person, place, and time.     UC Treatments / Results  Labs (all labs ordered are listed, but only abnormal results are displayed) Labs Reviewed  POCT URINALYSIS DIP (MANUAL ENTRY) - Abnormal; Notable for the following components:      Result Value   Spec Grav, UA >=1.030 (*)    Protein Ur, POC trace (*)    All other components within normal limits  URINE CULTURE    EKG   Radiology No results found.  Procedures Procedures (including critical care time)  Medications Ordered in UC Medications - No data to display  Initial Impression / Assessment and Plan / UC Course  I have reviewed the triage vital signs and the nursing notes.  Pertinent labs & imaging results that were available during my care of the patient were reviewed by me and considered in my medical decision making (see chart for details).     Abdominal pain/flank pain-UA not suggestive of UTI, discussed constipation recommendations, trial of Bentyl for abdominal  cramping.  Suspect back pain likely may be more muscular in nature given tenderness to touch, patient reports that she does not tolerate NSAIDs well as these cause her to blood pressure to increase again cause headaches.  Did provide patient a couple tablets of Tylenol 3 as she is previously tolerated this.  Diclofenac topically. Rash-appears very dry like eczema, but circular well demarcated area, does not appear bacterial, no signs of any cause at this time, providing Bactroban per request, but also providing Lotrisone to apply topically, continue moisturization  Discussed strict return precautions. Patient verbalized understanding and is agreeable with plan.  Final Clinical Impressions(s) / UC Diagnoses   Final diagnoses:  Rash and nonspecific skin eruption  Left lower quadrant abdominal pain  Acute left-sided low back pain without sciatica     Discharge Instructions      Lotrisone twice daily to rash on ankle, also use Bactroban ointment twice daily  Diclofenac topically to back Tylenol 3 for severe pain-use sparingly  May try Bentyl before meals and bedtime to help with abdominal cramping  Please use Miralax for moderate to severe constipation. Take this once a day for the next 2-3 days. Please also start docusate stool softener, twice a day for at least 1 week. If stools become loose, cut down to once a day for another week. If stools remain loose, cut back to 1 pill every other day for a third week. You can stop docusate thereafter and resume as needed for constipation.  Daily metamucil supplement  To help reduce constipation and promote bowel health: 1. Drink at least 64 ounces of water each day 2. Eat plenty of fiber (fruits, vegetables, whole grains, legumes) 3. Be physically active or exercise including walking, jogging, swimming, yoga, etc. 4. For active constipation use a stool softener (docusate) or an osmotic laxative (like Miralax) each day,  or as needed.   ED  Prescriptions     Medication Sig Dispense Auth. Provider   clotrimazole-betamethasone (LOTRISONE) cream Apply to affected area 2 times daily 45 g Laurence Crofford C, PA-C   mupirocin ointment (BACTROBAN) 2 % Apply 1 application topically 2 (two) times daily. 30 g Kaisey Huseby C, PA-C   acetaminophen-codeine (TYLENOL #3) 300-30 MG tablet Take 1-2 tablets by mouth every 6 (six) hours as needed for moderate pain or severe pain. 8 tablet Demaree Liberto C, PA-C   dicyclomine (BENTYL) 20 MG tablet Take 1 tablet (20 mg total) by mouth 4 (four) times daily -  before meals and at bedtime. As needed for cramping 20 tablet Tonnie Friedel C, PA-C   diclofenac Sodium (VOLTAREN) 1 % GEL Apply 2 g topically 4 (four) times daily. 150 g Mihail Prettyman, [place] C, PA-C      I have reviewed the PDMP during this encounter.   Janith Lima, [place] 07/31/20 [phone]

## 2020-08-02 LAB — URINE CULTURE: Culture: <10000 — AB

## 2020-08-08 DIAGNOSIS — I493 Ventricular premature depolarization: Secondary | ICD-10-CM | POA: Diagnosis not present

## 2020-08-08 DIAGNOSIS — Z124 Encounter for screening for malignant neoplasm of cervix: Secondary | ICD-10-CM | POA: Diagnosis not present

## 2020-08-08 DIAGNOSIS — Z1239 Encounter for other screening for malignant neoplasm of breast: Secondary | ICD-10-CM | POA: Diagnosis not present

## 2020-08-08 DIAGNOSIS — I10 Essential (primary) hypertension: Secondary | ICD-10-CM | POA: Diagnosis not present

## 2020-08-08 DIAGNOSIS — Z1211 Encounter for screening for malignant neoplasm of colon: Secondary | ICD-10-CM | POA: Diagnosis not present

## 2020-08-08 DIAGNOSIS — R1030 Lower abdominal pain, unspecified: Secondary | ICD-10-CM | POA: Diagnosis not present

## 2020-08-08 DIAGNOSIS — Z0000 Encounter for general adult medical examination without abnormal findings: Secondary | ICD-10-CM | POA: Diagnosis not present

## 2020-08-16 DIAGNOSIS — R1030 Lower abdominal pain, unspecified: Secondary | ICD-10-CM | POA: Diagnosis not present

## 2020-08-16 DIAGNOSIS — Z1211 Encounter for screening for malignant neoplasm of colon: Secondary | ICD-10-CM | POA: Diagnosis not present

## 2020-08-16 DIAGNOSIS — K5901 Slow transit constipation: Secondary | ICD-10-CM | POA: Diagnosis not present

## 2020-08-20 DIAGNOSIS — Z1231 Encounter for screening mammogram for malignant neoplasm of breast: Secondary | ICD-10-CM | POA: Diagnosis not present

## 2020-09-06 DIAGNOSIS — N6002 Solitary cyst of left breast: Secondary | ICD-10-CM | POA: Diagnosis not present

## 2020-09-06 DIAGNOSIS — N6082 Other benign mammary dysplasias of left breast: Secondary | ICD-10-CM | POA: Diagnosis not present

## 2020-09-06 DIAGNOSIS — R928 Other abnormal and inconclusive findings on diagnostic imaging of breast: Secondary | ICD-10-CM | POA: Diagnosis not present

## 2020-09-06 DIAGNOSIS — N6320 Unspecified lump in the left breast, unspecified quadrant: Secondary | ICD-10-CM | POA: Diagnosis not present

## 2020-09-24 ENCOUNTER — Other Ambulatory Visit: Payer: Self-pay

## 2020-09-24 ENCOUNTER — Ambulatory Visit
Admission: EM | Admit: 2020-09-24 | Discharge: 2020-09-24 | Disposition: A | Discharge status: Home / Self Care | Payer: Medicare Other | Attending: Urgent Care | Admitting: Urgent Care

## 2020-09-24 DIAGNOSIS — N760 Acute vaginitis: Secondary | ICD-10-CM

## 2020-09-24 DIAGNOSIS — R21 Rash and other nonspecific skin eruption: Secondary | ICD-10-CM | POA: Diagnosis not present

## 2020-09-24 LAB — POCT URINALYSIS DIP (MANUAL ENTRY)
Bilirubin, UA: NEGATIVE
Glucose, UA: NEGATIVE mg/dL
Nitrite, UA: NEGATIVE
Protein Ur, POC: 100 mg/dL — AB
Spec Grav, UA: >=1.03 — AB (ref 1.010–1.025)
Urobilinogen, UA: 4 E.U./dL — AB
pH, UA: 6 (ref 5.0–8.0)

## 2020-09-24 MED ORDER — FLUCONAZOLE 150 MG PO TABS: 150.0000 mg | ORAL_TABLET | ORAL | 0 refills | Status: DC

## 2020-09-24 NOTE — ED Provider Notes (Signed)
[place]   MRN: [id number] DOB: [birth year]  Subjective:   Sarah Mathews is a 55 y.o. female presenting for 1 week history of persistent vaginal itching, vaginal irritation and malodor.  Symptoms started after patient used a douche.  Denies fever, nausea, vomiting, abdominal or pelvic pain, dysuria, urinary frequency.  Admits that she does not drink water.  Drinks a lot of Kool-Aid.  She also has a persistent rash over the left lower side of her ankle.  Has been using the cream prescribed to her, clotrimazole betamethasone without any relief.  No current facility-administered medications for this encounter.  Current Outpatient Medications:    acetaminophen (TYLENOL) 500 MG tablet, Take 500 mg by mouth as needed., Disp: , Rfl:    acetaminophen-codeine (TYLENOL #3) 300-30 MG tablet, Take 1-2 tablets by mouth every 6 (six) hours as needed for moderate pain or severe pain., Disp: 8 tablet, Rfl: 0   albuterol (PROVENTIL HFA;VENTOLIN HFA) 108 (90 Base) MCG/ACT inhaler, Inhale 2 puffs into the lungs every 6 (six) hours as needed for wheezing or shortness of breath., Disp: 1 Inhaler, Rfl: 2   aspirin EC 81 MG tablet, Take 81 mg by mouth daily., Disp: , Rfl:    azelastine (ASTELIN) 0.1 % nasal spray, Place 2 sprays into both nostrils 2 (two) times daily., Disp: 30 mL, Rfl: 0   budesonide-formoterol (SYMBICORT) 80-4.5 MCG/ACT inhaler, Inhale 2 puffs into the lungs daily., Disp: 1 Inhaler, Rfl: 1   cetirizine (ZYRTEC ALLERGY) 10 MG tablet, Take 1 tablet (10 mg total) by mouth daily., Disp: 30 tablet, Rfl: 0   clotrimazole-betamethasone (LOTRISONE) cream, Apply to affected area 2 times daily, Disp: 45 g, Rfl: 0   diclofenac Sodium (VOLTAREN) 1 % GEL, Apply 2 g topically 4 (four) times daily., Disp: 150 g, Rfl: 0   dicyclomine (BENTYL) 20 MG tablet, Take 1 tablet (20 mg total) by mouth 4 (four) times daily -  before meals and at bedtime. As needed for cramping, Disp: 20 tablet, Rfl: 0    fluticasone (FLONASE) 50 MCG/ACT nasal spray, Place 1 spray into both nostrils daily as needed for allergies., Disp: 16 g, Rfl: 0   mupirocin ointment (BACTROBAN) 2 %, Apply 1 application topically 2 (two) times daily., Disp: 30 g, Rfl: 0   Allergies  Allergen Reactions   Rocephin [Ceftriaxone] Anaphylaxis   Buspirone Other (See Comments)    Hand cramping   Butorphanol Other (See Comments)    delierious delierious delierious   Oxycodone-Acetaminophen Nausea And Vomiting   Propoxyphene Nausea Only and Other (See Comments)    In high amount heart palpitations   Darvocet [Propoxyphene N-Acetaminophen] Other (See Comments)    In high amount heart palpitations   Darvon [Propoxyphene Hcl]    Percocet [Oxycodone-Acetaminophen] Nausea And Vomiting   Toradol [Ketorolac Tromethamine] Hypertension   Doxycycline Diarrhea    Past Medical History:  Diagnosis Date   Depressed    Fibroids    GERD (gastroesophageal reflux disease)    Hypertension    Obesity      Past Surgical History:  Procedure Laterality Date   DILATION AND CURETTAGE OF UTERUS  09/2006   TUBAL LIGATION      Family History  Problem Relation Age of Onset   Heart failure Mother     Social History   Tobacco Use   Smoking status: Never   Smokeless tobacco: Never  Substance Use Topics   Alcohol use: Yes   Drug use: No    ROS  Objective:   Vitals: BP 139/80 (BP Location: Left Arm)   Pulse (!) 54   Temp 97.9 F (36.6 C) (Oral)   Resp 20   LMP 03/09/2016   SpO2 98%   Physical Exam Constitutional:      General: She is not in acute distress.    Appearance: Normal appearance. She is well-developed. She is obese. She is not ill-appearing, toxic-appearing or diaphoretic.  HENT:     Head: Normocephalic and atraumatic.     Nose: Nose normal.     Mouth/Throat:     Mouth: Mucous membranes are moist.     Pharynx: Oropharynx is clear.  Eyes:     General: No scleral icterus.       Right eye: No discharge.         Left eye: No discharge.     Extraocular Movements: Extraocular movements intact.     Conjunctiva/sclera: Conjunctivae normal.     Pupils: Pupils are equal, round, and reactive to light.  Cardiovascular:     Rate and Rhythm: Normal rate.  Pulmonary:     Effort: Pulmonary effort is normal.  Abdominal:     General: Bowel sounds are normal. There is no distension.     Palpations: Abdomen is soft. There is no mass.     Tenderness: There is no abdominal tenderness. There is no right CVA tenderness, left CVA tenderness, guarding or rebound.  Skin:    General: Skin is warm and dry.     Findings: Rash (Hyperpigmented lesion extending ~3cm in diameter over the lateral lower left leg) present.  Neurological:     General: No focal deficit present.     Mental Status: She is alert and oriented to person, place, and time.  Psychiatric:        Mood and Affect: Mood normal.        Behavior: Behavior normal.        Thought Content: Thought content normal.        Judgment: Judgment normal.    Results for orders placed or performed during the hospital encounter of 09/24/20 (from the past 24 hour(s))  POCT urinalysis dipstick     Status: Abnormal   Collection Time: 09/24/20  7:26 PM  Result Value Ref Range   Color, UA yellow yellow   Clarity, UA cloudy (A) clear   Glucose, UA negative negative mg/dL   Bilirubin, UA negative negative   Ketones, POC UA small (15) (A) negative mg/dL   Spec Grav, UA >=1.030 (A) 1.010 - 1.025   Blood, UA small (A) negative   pH, UA 6.0 5.0 - 8.0   Protein Ur, POC =100 (A) negative mg/dL   Urobilinogen, UA 4.0 (A) 0.2 or 1.0 E.U./dL   Nitrite, UA Negative Negative   Leukocytes, UA Trace (A) Negative    Assessment and Plan :   PDMP not reviewed this encounter.  1. Acute vaginitis   2. Rash and nonspecific skin eruption     Recommended Diflucan to address yeast vaginitis.  Low suspicion for PID, labs pending.  Emphasized need to hydrate much better and  avoid douching.  Recommended follow-up with a dermatologist for consultation on her skin lesion, consideration for biopsy. Counseled patient on potential for adverse effects with medications prescribed/recommended today, ER and return-to-clinic precautions discussed, patient verbalized understanding.    Jaynee Eagles, PA-C 09/24/20 2006

## 2020-09-24 NOTE — ED Triage Notes (Signed)
Pt states after douching last week she developed vaginal itchy, irritation, clear discharge, and odor.

## 2020-09-26 ENCOUNTER — Telehealth (HOSPITAL_COMMUNITY): Payer: Self-pay | Admitting: Emergency Medicine

## 2020-09-26 LAB — CERVICOVAGINAL ANCILLARY ONLY
Bacterial Vaginitis (gardnerella): NEGATIVE
Candida Glabrata: NEGATIVE
Candida Vaginitis: NEGATIVE
Chlamydia: NEGATIVE
Comment: NEGATIVE
Comment: NEGATIVE
Comment: NEGATIVE
Comment: NEGATIVE
Comment: NEGATIVE
Comment: NORMAL
Neisseria Gonorrhea: NEGATIVE
Trichomonas: POSITIVE — AB

## 2020-09-26 MED ORDER — METRONIDAZOLE 500 MG PO TABS: 500.0000 mg | ORAL_TABLET | Freq: Two times a day (BID) | ORAL | 0 refills | Status: DC

## 2020-10-01 DIAGNOSIS — Z8249 Family history of ischemic heart disease and other diseases of the circulatory system: Secondary | ICD-10-CM | POA: Diagnosis not present

## 2020-10-01 DIAGNOSIS — I493 Ventricular premature depolarization: Secondary | ICD-10-CM | POA: Diagnosis not present

## 2020-10-01 DIAGNOSIS — R002 Palpitations: Secondary | ICD-10-CM | POA: Diagnosis not present

## 2020-11-06 DIAGNOSIS — K5900 Constipation, unspecified: Secondary | ICD-10-CM | POA: Diagnosis not present

## 2020-11-06 DIAGNOSIS — R1030 Lower abdominal pain, unspecified: Secondary | ICD-10-CM | POA: Diagnosis not present

## 2020-11-06 DIAGNOSIS — R634 Abnormal weight loss: Secondary | ICD-10-CM | POA: Diagnosis not present

## 2021-01-08 ENCOUNTER — Other Ambulatory Visit: Payer: Self-pay

## 2021-01-08 ENCOUNTER — Ambulatory Visit
Admission: EM | Admit: 2021-01-08 | Discharge: 2021-01-08 | Disposition: A | Discharge status: Home / Self Care | Payer: Medicare Other | Attending: Physician Assistant | Admitting: Physician Assistant

## 2021-01-08 ENCOUNTER — Encounter: Payer: Self-pay | Admitting: Emergency Medicine

## 2021-01-08 DIAGNOSIS — J069 Acute upper respiratory infection, unspecified: Secondary | ICD-10-CM

## 2021-01-08 MED ORDER — FLUTICASONE PROPIONATE 50 MCG/ACT NA SUSP: 1.0000 | Freq: Every day | NASAL | 0 refills | Status: AC

## 2021-01-08 NOTE — ED Triage Notes (Signed)
Patient states that she's been exposed to [place] from her daughter.  Patient does have a cough, some congestion and wheezing in chest.  Patient has taken Mucinex, Zyrtec, Elderberry.

## 2021-01-08 NOTE — ED Provider Notes (Signed)
[facility]    CSN: [id number] Arrival date & time: 01/08/21  1637      History   Chief Complaint Chief Complaint  Patient presents with   Cough   Shortness of Breath    HPI Sarah Mathews is a 55 y.o. female.   Patient here today for evaluation of nasal congestion, cough that started a few days ago. She has not had fever. She states her daughter called her today and told her her family tested positive for covid. They were all together at Thanksgiving last week. Patient does not report any vomiting or diarrhea. She has tried OTC meds with mild relief.   The history is provided by the patient.   Past Medical History:  Diagnosis Date   Depressed    Fibroids    GERD (gastroesophageal reflux disease)    Hypertension    Obesity     Patient Active Problem List   Diagnosis Date Noted   Asthmatic bronchitis 09/03/2016   SUI (stress urinary incontinence, female) 02/06/2016   Dysmenorrhea 12/25/2015   Abnormal uterine bleeding (AUB) 12/25/2015   Perimenopausal 12/25/2015   Metatarsal deformity 11/09/2014   Pain, foot 02/07/2013   Equinus deformity of foot, acquired 02/07/2013   Tenosynovitis of foot and ankle 02/07/2013    Past Surgical History:  Procedure Laterality Date   DILATION AND CURETTAGE OF UTERUS  09/2006   TUBAL LIGATION      OB History     Gravida  17   Para  7   Term  6   Preterm  1   AB  3   Living  7      SAB  2   IAB      Ectopic  1   Multiple  0   Live Births  7            Home Medications    Prior to Admission medications   Medication Sig Start Date End Date Taking? Authorizing Provider  acetaminophen (TYLENOL) 500 MG tablet Take 500 mg by mouth as needed.   Yes [provider]  acetaminophen-codeine (TYLENOL #3) 300-30 MG tablet Take 1-2 tablets by mouth every 6 (six) hours as needed for moderate pain or severe pain. 07/30/20  Yes Wieters, Hallie C, PA-C  albuterol (PROVENTIL HFA;VENTOLIN HFA) 108  (90 Base) MCG/ACT inhaler Inhale 2 puffs into the lungs every 6 (six) hours as needed for wheezing or shortness of breath. 04/28/18  Yes Amyot, Nicholes Stairs, NP  aspirin EC 81 MG tablet Take 81 mg by mouth daily.   Yes [provider]  azelastine (ASTELIN) 0.1 % nasal spray Place 2 sprays into both nostrils 2 (two) times daily. 02/27/19  Yes Yu, Amy V, PA-C  budesonide-formoterol (SYMBICORT) 80-4.5 MCG/ACT inhaler Inhale 2 puffs into the lungs daily. 05/09/17  Yes Jaynee Eagles, PA-C  cetirizine (ZYRTEC ALLERGY) 10 MG tablet Take 1 tablet (10 mg total) by mouth daily. 02/29/20  Yes Hall-Potvin, [place], PA-C  clotrimazole-betamethasone (LOTRISONE) cream Apply to affected area 2 times daily 07/30/20  Yes Wieters, Hallie C, PA-C  diclofenac Sodium (VOLTAREN) 1 % GEL Apply 2 g topically 4 (four) times daily. 07/30/20  Yes Wieters, Hallie C, PA-C  dicyclomine (BENTYL) 20 MG tablet Take 1 tablet (20 mg total) by mouth 4 (four) times daily -  before meals and at bedtime. As needed for cramping 07/30/20  Yes Wieters, Hallie C, PA-C  fluconazole (DIFLUCAN) 150 MG tablet Take 1 tablet (150 mg total) by mouth  once a week. 09/24/20  Yes Jaynee Eagles, PA-C  fluticasone (FLONASE) 50 MCG/ACT nasal spray Place 1 spray into both nostrils daily. 01/08/21  Yes Francene Finders, PA-C  metroNIDAZOLE (FLAGYL) 500 MG tablet Take 1 tablet (500 mg total) by mouth 2 (two) times daily. 09/26/20  Yes Lamptey, Myrene Galas, MD  mupirocin ointment (BACTROBAN) 2 % Apply 1 application topically 2 (two) times daily. 07/30/20  Yes Wieters, [place] C, PA-C    Family History Family History  Problem Relation Age of Onset   Heart failure Mother     Social History Social History   Tobacco Use   Smoking status: Never   Smokeless tobacco: Never  Substance Use Topics   Alcohol use: Yes   Drug use: No     Allergies   Rocephin [ceftriaxone], Buspirone, Butorphanol, Oxycodone-acetaminophen, Propoxyphene, Darvocet [propoxyphene  n-acetaminophen], Darvon [propoxyphene hcl], Percocet [oxycodone-acetaminophen], Toradol [ketorolac tromethamine], and Doxycycline   Review of Systems Review of Systems  Constitutional:  Negative for chills and fever.  HENT:  Positive for congestion and sinus pressure. Negative for ear pain and sore throat.   Eyes:  Negative for discharge and redness.  Respiratory:  Positive for cough. Negative for shortness of breath and wheezing.   Gastrointestinal:  Negative for abdominal pain, diarrhea, nausea and vomiting.    Physical Exam Triage Vital Signs ED Triage Vitals  Enc Vitals Group     BP      Pulse      Resp      Temp      Temp src      SpO2      Weight      Height      Head Circumference      Peak Flow      Pain Score      Pain Loc      Pain Edu?      Excl. in [place]?    No data found.  Updated Vital Signs BP (!) 152/81 (BP Location: Left Arm)   Pulse 70   Temp 98 F (36.7 C) (Oral)   Ht 5\' 4"  (1.626 m)   Wt 234 lb 9 oz (106.4 kg)   LMP 03/09/2016   SpO2 98%   BMI 40.26 kg/m      Physical Exam Vitals and nursing note reviewed.  Constitutional:      General: She is not in acute distress.    Appearance: Normal appearance. She is not ill-appearing.  HENT:     Head: Normocephalic and atraumatic.     Nose: Congestion present.     Mouth/Throat:     Mouth: Mucous membranes are moist.     Pharynx: No oropharyngeal exudate or posterior oropharyngeal erythema.  Eyes:     Conjunctiva/sclera: Conjunctivae normal.  Cardiovascular:     Rate and Rhythm: Normal rate and regular rhythm.     Heart sounds: Normal heart sounds. No murmur heard. Pulmonary:     Effort: Pulmonary effort is normal. No respiratory distress.     Breath sounds: Normal breath sounds. No wheezing, rhonchi or rales.  Skin:    General: Skin is warm and dry.  Neurological:     Mental Status: She is alert.  Psychiatric:        Mood and Affect: Mood normal.        Thought Content: Thought content  normal.     UC Treatments / Results  Labs (all labs ordered are listed, but only abnormal results are displayed) Labs Reviewed  COVID-19, FLU A+B NAA    EKG   Radiology No results found.  Procedures Procedures (including critical care time)  Medications Ordered in UC Medications - No data to display  Initial Impression / Assessment and Plan / UC Course  I have reviewed the triage vital signs and the nursing notes.  Pertinent labs & imaging results that were available during my care of the patient were reviewed by me and considered in my medical decision making (see chart for details).    Suspect viral etiology of symptoms. Patient requests flonase- sent to pharmacy as requested. Covid and flu screening ordered. Recommend follow up with any concerns.   Final Clinical Impressions(s) / UC Diagnoses   Final diagnoses:  Acute upper respiratory infection   Discharge Instructions   None    ED Prescriptions     Medication Sig Dispense Auth. Provider   fluticasone (FLONASE) 50 MCG/ACT nasal spray Place 1 spray into both nostrils daily. 16 g Francene Finders, PA-C      PDMP not reviewed this encounter.   Francene Finders, PA-C 01/08/21 1720

## 2021-01-09 LAB — COVID-19, FLU A+B NAA
Influenza A, NAA: NOT DETECTED
Influenza B, NAA: NOT DETECTED
SARS-CoV-2, NAA: NOT DETECTED

## 2021-02-19 DIAGNOSIS — Z87891 Personal history of nicotine dependence: Secondary | ICD-10-CM | POA: Diagnosis not present

## 2021-02-19 DIAGNOSIS — Z7951 Long term (current) use of inhaled steroids: Secondary | ICD-10-CM | POA: Diagnosis not present

## 2021-02-19 DIAGNOSIS — R011 Cardiac murmur, unspecified: Secondary | ICD-10-CM | POA: Diagnosis not present

## 2021-02-19 DIAGNOSIS — Z8249 Family history of ischemic heart disease and other diseases of the circulatory system: Secondary | ICD-10-CM | POA: Diagnosis not present

## 2021-02-19 DIAGNOSIS — G8929 Other chronic pain: Secondary | ICD-10-CM | POA: Diagnosis not present

## 2021-02-19 DIAGNOSIS — L989 Disorder of the skin and subcutaneous tissue, unspecified: Secondary | ICD-10-CM | POA: Diagnosis not present

## 2021-02-19 DIAGNOSIS — Z79899 Other long term (current) drug therapy: Secondary | ICD-10-CM | POA: Diagnosis not present

## 2021-02-19 DIAGNOSIS — Z7189 Other specified counseling: Secondary | ICD-10-CM | POA: Diagnosis not present

## 2021-02-19 DIAGNOSIS — K5901 Slow transit constipation: Secondary | ICD-10-CM | POA: Diagnosis not present

## 2021-02-19 DIAGNOSIS — Z0000 Encounter for general adult medical examination without abnormal findings: Secondary | ICD-10-CM | POA: Diagnosis not present

## 2021-02-19 DIAGNOSIS — J4520 Mild intermittent asthma, uncomplicated: Secondary | ICD-10-CM | POA: Diagnosis not present

## 2021-02-19 DIAGNOSIS — Z862 Personal history of diseases of the blood and blood-forming organs and certain disorders involving the immune mechanism: Secondary | ICD-10-CM | POA: Diagnosis not present

## 2021-02-19 DIAGNOSIS — N912 Amenorrhea, unspecified: Secondary | ICD-10-CM | POA: Diagnosis not present

## 2021-02-19 DIAGNOSIS — Z7982 Long term (current) use of aspirin: Secondary | ICD-10-CM | POA: Diagnosis not present

## 2021-02-19 DIAGNOSIS — M5441 Lumbago with sciatica, right side: Secondary | ICD-10-CM | POA: Diagnosis not present

## 2021-02-19 DIAGNOSIS — R0683 Snoring: Secondary | ICD-10-CM | POA: Diagnosis not present

## 2021-03-05 DIAGNOSIS — I517 Cardiomegaly: Secondary | ICD-10-CM | POA: Diagnosis not present

## 2021-03-05 DIAGNOSIS — R109 Unspecified abdominal pain: Secondary | ICD-10-CM | POA: Diagnosis not present

## 2021-03-05 DIAGNOSIS — M5137 Other intervertebral disc degeneration, lumbosacral region: Secondary | ICD-10-CM | POA: Diagnosis not present

## 2021-03-05 DIAGNOSIS — M545 Low back pain, unspecified: Secondary | ICD-10-CM | POA: Diagnosis not present

## 2021-03-05 DIAGNOSIS — G8929 Other chronic pain: Secondary | ICD-10-CM | POA: Diagnosis not present

## 2021-03-05 DIAGNOSIS — M5441 Lumbago with sciatica, right side: Secondary | ICD-10-CM | POA: Diagnosis not present

## 2021-03-05 DIAGNOSIS — K5901 Slow transit constipation: Secondary | ICD-10-CM | POA: Diagnosis not present

## 2021-03-19 DIAGNOSIS — G8929 Other chronic pain: Secondary | ICD-10-CM | POA: Diagnosis not present

## 2021-03-19 DIAGNOSIS — K921 Melena: Secondary | ICD-10-CM | POA: Diagnosis not present

## 2021-03-19 DIAGNOSIS — M545 Low back pain, unspecified: Secondary | ICD-10-CM | POA: Diagnosis not present

## 2021-03-19 DIAGNOSIS — R011 Cardiac murmur, unspecified: Secondary | ICD-10-CM | POA: Diagnosis not present

## 2021-03-19 DIAGNOSIS — K5901 Slow transit constipation: Secondary | ICD-10-CM | POA: Diagnosis not present

## 2021-03-19 DIAGNOSIS — D509 Iron deficiency anemia, unspecified: Secondary | ICD-10-CM | POA: Diagnosis not present

## 2021-03-19 DIAGNOSIS — J4520 Mild intermittent asthma, uncomplicated: Secondary | ICD-10-CM | POA: Diagnosis not present

## 2021-03-26 DIAGNOSIS — K921 Melena: Secondary | ICD-10-CM | POA: Diagnosis not present

## 2021-03-26 DIAGNOSIS — D509 Iron deficiency anemia, unspecified: Secondary | ICD-10-CM | POA: Diagnosis not present

## 2021-03-26 DIAGNOSIS — K5909 Other constipation: Secondary | ICD-10-CM | POA: Diagnosis not present

## 2021-04-03 ENCOUNTER — Encounter: Payer: Self-pay | Admitting: Emergency Medicine

## 2021-04-03 ENCOUNTER — Ambulatory Visit
Admission: EM | Admit: 2021-04-03 | Discharge: 2021-04-03 | Disposition: A | Discharge status: Home / Self Care | Payer: Medicare Other | Attending: Physician Assistant | Admitting: Physician Assistant

## 2021-04-03 DIAGNOSIS — J069 Acute upper respiratory infection, unspecified: Secondary | ICD-10-CM | POA: Diagnosis not present

## 2021-04-03 DIAGNOSIS — F410 Panic disorder [episodic paroxysmal anxiety] without agoraphobia: Secondary | ICD-10-CM

## 2021-04-03 NOTE — ED Triage Notes (Addendum)
Hx of panic attacks. Has been very overwhelmed with her cousin passing. Noticed around 7:30 tonight that she had the feeling of everything closing in on her, her heart began to race, she started to feel short of breath and having sharp chest pain, then started to burp. She said this is the same presentation of all her panic attacks. Was recently worked up with cardiology, had holter monitoring and an echo without any identifiable problems. Reports that she is now starting to feel better. Also believes she may be developing a URI that could be contributing to the symptoms after being exposed to everyone at the funeral

## 2021-04-04 NOTE — ED Provider Notes (Signed)
[place]    CSN: [id number] Arrival date & time: 04/03/21  1945      History   Chief Complaint Chief Complaint  Patient presents with   Panic Attack    HPI Sarah Mathews is a 56 y.o. female.   Patient here today for evaluation of possible panic attack.  She states that she has been overwhelmed recently with the passing of her young cousin, and states that around 51 tonight when she was sweeping she felt an overwhelming feeling of things closing in on her, and she started to have her heart race and some shortness of breath.  She states she did have some chest pain with this.  After that she started to burp and so is not sure if symptoms were related to indigestion.  She has had full work-up recently by cardiology that was all negative.  She does report after getting in room at urgent care she is starting to feel better, shortness of breath is improving and chest pain has almost completely subsided.  She notes she also may be developing upper respiratory infection after she was exposed to several people at the funeral.  The history is provided by the patient.   Past Medical History:  Diagnosis Date   Depressed    Fibroids    GERD (gastroesophageal reflux disease)    Hypertension    Obesity     Patient Active Problem List   Diagnosis Date Noted   Asthmatic bronchitis 09/03/2016   SUI (stress urinary incontinence, female) 02/06/2016   Dysmenorrhea 12/25/2015   Abnormal uterine bleeding (AUB) 12/25/2015   Perimenopausal 12/25/2015   Metatarsal deformity 11/09/2014   Pain, foot 02/07/2013   Equinus deformity of foot, acquired 02/07/2013   Tenosynovitis of foot and ankle 02/07/2013    Past Surgical History:  Procedure Laterality Date   DILATION AND CURETTAGE OF UTERUS  09/2006   TUBAL LIGATION      OB History     Gravida  17   Para  7   Term  6   Preterm  1   AB  3   Living  7      SAB  2   IAB      Ectopic  1   Multiple  0   Live  Births  7            Home Medications    Prior to Admission medications   Medication Sig Start Date End Date Taking? Authorizing Provider  acetaminophen (TYLENOL) 500 MG tablet Take 500 mg by mouth as needed.    [provider]  acetaminophen-codeine (TYLENOL #3) 300-30 MG tablet Take 1-2 tablets by mouth every 6 (six) hours as needed for moderate pain or severe pain. 07/30/20   Wieters, Hallie C, PA-C  albuterol (PROVENTIL HFA;VENTOLIN HFA) 108 (90 Base) MCG/ACT inhaler Inhale 2 puffs into the lungs every 6 (six) hours as needed for wheezing or shortness of breath. 04/28/18   Katy Apo, NP  aspirin EC 81 MG tablet Take 81 mg by mouth daily.    [provider]  azelastine (ASTELIN) 0.1 % nasal spray Place 2 sprays into both nostrils 2 (two) times daily. 02/27/19   Tasia Catchings, Amy V, PA-C  budesonide-formoterol (SYMBICORT) 80-4.5 MCG/ACT inhaler Inhale 2 puffs into the lungs daily. 05/09/17   Jaynee Eagles, PA-C  cetirizine (ZYRTEC ALLERGY) 10 MG tablet Take 1 tablet (10 mg total) by mouth daily. 02/29/20   Hall-Potvin, [place], PA-C  clotrimazole-betamethasone (LOTRISONE) cream  Apply to affected area 2 times daily 07/30/20   Wieters, [employer] [place] C, PA-C  diclofenac Sodium (VOLTAREN) 1 % GEL Apply 2 g topically 4 (four) times daily. 07/30/20   Wieters, Hallie C, PA-C  dicyclomine (BENTYL) 20 MG tablet Take 1 tablet (20 mg total) by mouth 4 (four) times daily -  before meals and at bedtime. As needed for cramping 07/30/20   Wieters, Hallie C, PA-C  fluconazole (DIFLUCAN) 150 MG tablet Take 1 tablet (150 mg total) by mouth once a week. 09/24/20   Jaynee Eagles, PA-C  fluticasone (FLONASE) 50 MCG/ACT nasal spray Place 1 spray into both nostrils daily. 01/08/21   Francene Finders, PA-C  metroNIDAZOLE (FLAGYL) 500 MG tablet Take 1 tablet (500 mg total) by mouth 2 (two) times daily. 09/26/20   LampteyMyrene Galas, MD  mupirocin ointment (BACTROBAN) 2 % Apply 1 application topically 2 (two) times  daily. 07/30/20   Wieters, Elesa Hacker, PA-C    Family History Family History  Problem Relation Age of Onset   Heart failure Mother     Social History Social History   Tobacco Use   Smoking status: Never   Smokeless tobacco: Never  Substance Use Topics   Alcohol use: Yes   Drug use: No     Allergies   Rocephin [ceftriaxone], Buspirone, Butorphanol, Oxycodone-acetaminophen, Propoxyphene, Darvocet [propoxyphene n-acetaminophen], Darvon [propoxyphene hcl], Percocet [oxycodone-acetaminophen], Toradol [ketorolac tromethamine], and Doxycycline   Review of Systems Review of Systems  Constitutional:  Negative for chills and fever.  HENT:  Positive for congestion and rhinorrhea.   Eyes:  Negative for discharge and redness.  Respiratory:  Positive for cough, chest tightness and shortness of breath.   Cardiovascular:  Positive for chest pain.  Gastrointestinal:  Negative for abdominal pain, diarrhea, nausea and vomiting.  Genitourinary:  Positive for vaginal bleeding and vaginal discharge.  Psychiatric/Behavioral:  The patient is nervous/anxious.     Physical Exam Triage Vital Signs ED Triage Vitals  Enc Vitals Group     BP 04/03/21 1952 (!) 147/85     Pulse Rate 04/03/21 1952 79     Resp 04/03/21 1952 16     Temp 04/03/21 1952 (!) 97.5 F (36.4 C)     Temp Source 04/03/21 1952 Oral     SpO2 04/03/21 1952 98 %     Weight --      Height --      Head Circumference --      Peak Flow --      Pain Score 04/03/21 1953 0     Pain Loc --      Pain Edu? --      Excl. in [place]? --    No data found.  Updated Vital Signs BP (!) 147/85 (BP Location: Left Arm)    Pulse 79    Temp (!) 97.5 F (36.4 C) (Oral)    Resp 16    LMP 03/09/2016    SpO2 98%   Physical Exam Vitals and nursing note reviewed.  Constitutional:      General: She is not in acute distress.    Appearance: Normal appearance. She is not ill-appearing.  HENT:     Head: Normocephalic and atraumatic.     Nose:  Congestion present.  Eyes:     Conjunctiva/sclera: Conjunctivae normal.  Cardiovascular:     Rate and Rhythm: Normal rate and regular rhythm.     Heart sounds: Normal heart sounds. No murmur heard. Pulmonary:     Effort: Pulmonary effort  is normal. No respiratory distress.     Breath sounds: Normal breath sounds. No wheezing, rhonchi or rales.  Neurological:     Mental Status: She is alert.  Psychiatric:        Mood and Affect: Mood normal.        Behavior: Behavior normal.        Thought Content: Thought content normal.     UC Treatments / Results  Labs (all labs ordered are listed, but only abnormal results are displayed) Labs Reviewed  NOVEL CORONAVIRUS, NAA    EKG   Radiology No results found.  Procedures Procedures (including critical care time)  Medications Ordered in UC Medications - No data to display  Initial Impression / Assessment and Plan / UC Course  I have reviewed the triage vital signs and the nursing notes.  Pertinent labs & imaging results that were available during my care of the patient were reviewed by me and considered in my medical decision making (see chart for details).    Patient appears to be more calm on exam after triage. Recommended she continue to monitor symptoms. Very low suspicion for cardiac etiology given recent thorough work up and improvement in clinic. Will screen for covid. Encouraged follow up with any further concerns. Advised ED if she has any further shortness of breath or chest discomfort.  Final Clinical Impressions(s) / UC Diagnoses   Final diagnoses:  Acute upper respiratory infection  Panic attack   Discharge Instructions   None    ED Prescriptions   None    PDMP not reviewed this encounter.   Francene Finders, PA-C 04/04/21 [phone]

## 2021-04-05 LAB — NOVEL CORONAVIRUS, NAA: SARS-CoV-2, NAA: DETECTED — AB

## 2021-04-11 DIAGNOSIS — Z7409 Other reduced mobility: Secondary | ICD-10-CM | POA: Diagnosis not present

## 2021-04-11 DIAGNOSIS — M545 Low back pain, unspecified: Secondary | ICD-10-CM | POA: Diagnosis not present

## 2021-04-11 DIAGNOSIS — M6281 Muscle weakness (generalized): Secondary | ICD-10-CM | POA: Diagnosis not present

## 2021-04-11 DIAGNOSIS — M6289 Other specified disorders of muscle: Secondary | ICD-10-CM | POA: Diagnosis not present

## 2021-04-11 DIAGNOSIS — G8929 Other chronic pain: Secondary | ICD-10-CM | POA: Diagnosis not present

## 2021-04-17 DIAGNOSIS — Z1151 Encounter for screening for human papillomavirus (HPV): Secondary | ICD-10-CM | POA: Diagnosis not present

## 2021-04-17 DIAGNOSIS — R87810 Cervical high risk human papillomavirus (HPV) DNA test positive: Secondary | ICD-10-CM | POA: Diagnosis not present

## 2021-04-17 DIAGNOSIS — Z01419 Encounter for gynecological examination (general) (routine) without abnormal findings: Secondary | ICD-10-CM | POA: Diagnosis not present

## 2021-04-17 DIAGNOSIS — Z124 Encounter for screening for malignant neoplasm of cervix: Secondary | ICD-10-CM | POA: Diagnosis not present

## 2021-04-22 ENCOUNTER — Emergency Department (HOSPITAL_COMMUNITY): Payer: Medicare Other

## 2021-04-22 ENCOUNTER — Emergency Department (HOSPITAL_COMMUNITY)
Admission: EM | Admit: 2021-04-22 | Discharge: 2021-04-22 | Disposition: A | Discharge status: Home / Self Care | Payer: Medicare Other | Attending: Emergency Medicine | Admitting: Emergency Medicine

## 2021-04-22 ENCOUNTER — Ambulatory Visit
Admission: EM | Admit: 2021-04-22 | Discharge: 2021-04-22 | Disposition: A | Discharge status: Home / Self Care | Payer: Medicare Other

## 2021-04-22 ENCOUNTER — Other Ambulatory Visit: Payer: Self-pay

## 2021-04-22 ENCOUNTER — Encounter (HOSPITAL_COMMUNITY): Payer: Self-pay

## 2021-04-22 ENCOUNTER — Encounter: Payer: Self-pay | Admitting: Emergency Medicine

## 2021-04-22 DIAGNOSIS — Z7982 Long term (current) use of aspirin: Secondary | ICD-10-CM | POA: Diagnosis not present

## 2021-04-22 DIAGNOSIS — F458 Other somatoform disorders: Secondary | ICD-10-CM | POA: Diagnosis not present

## 2021-04-22 DIAGNOSIS — J029 Acute pharyngitis, unspecified: Secondary | ICD-10-CM | POA: Diagnosis not present

## 2021-04-22 DIAGNOSIS — Z79899 Other long term (current) drug therapy: Secondary | ICD-10-CM | POA: Insufficient documentation

## 2021-04-22 DIAGNOSIS — R1310 Dysphagia, unspecified: Secondary | ICD-10-CM

## 2021-04-22 DIAGNOSIS — R0989 Other specified symptoms and signs involving the circulatory and respiratory systems: Secondary | ICD-10-CM | POA: Diagnosis not present

## 2021-04-22 DIAGNOSIS — R9431 Abnormal electrocardiogram [ECG] [EKG]: Secondary | ICD-10-CM | POA: Diagnosis not present

## 2021-04-22 DIAGNOSIS — R0789 Other chest pain: Secondary | ICD-10-CM | POA: Diagnosis not present

## 2021-04-22 MED ORDER — ALUM & MAG HYDROXIDE-SIMETH 200-200-20 MG/5ML PO SUSP
30.0000 mL | Freq: Once | ORAL | Status: AC
Start: 2021-04-22 — End: 2021-04-22
  Administered 2021-04-22: 30 mL via ORAL
  Filled 2021-04-22: qty 30

## 2021-04-22 MED ORDER — LIDOCAINE VISCOUS HCL 2 % MT SOLN
15.0000 mL | Freq: Once | OROMUCOSAL | Status: AC
  Administered 2021-04-22: 15 mL via ORAL
  Filled 2021-04-22: qty 15

## 2021-04-22 MED ORDER — GLUCAGON HCL RDNA (DIAGNOSTIC) 1 MG IJ SOLR
1.0000 mg | Freq: Once | INTRAMUSCULAR | Status: AC
Start: 2021-04-22 — End: 2021-04-22
  Administered 2021-04-22: 1 mg via INTRAVENOUS
  Filled 2021-04-22: qty 1

## 2021-04-22 MED ORDER — OMEPRAZOLE 20 MG PO CPDR: 20.0000 mg | DELAYED_RELEASE_CAPSULE | Freq: Every day | ORAL | 0 refills | Status: DC

## 2021-04-22 NOTE — ED Provider Notes (Signed)
Patient here today for concerns she may have something lodged in her throat/ chest after eating ribs 2 days ago. She has not had any shortness of breath. She has had similar happen in the past and had to have a "numbing" medicine for her esophagus. I recommended further evaluation in the ED as we do not have imaging capabilities needed for further evaluation. Patient is reluctant but agreeable.  ?  ?Francene Finders, PA-C ?04/22/21 1309 ? ?

## 2021-04-22 NOTE — ED Triage Notes (Signed)
Patient states she ate ribs 2 days ago. Patient states that she feels like something in her throat and chest. Patient states she tried to eat a cracker and continued to feel like something in her throat. ?Patient states she is able to swallow liquids, but is unable to swallow solids. ?Patient denies any breathing issues or coughing. ? ?

## 2021-04-22 NOTE — Discharge Instructions (Addendum)
Please follow up with your gastroenterologist for further evaluation of your ongoing issues of sensation of certain foods getting stuck.  ? ?Pick up omeprazole and take as prescribed to help with acid reflux ? ?Return to the ED for any new/worsening symptoms ? ?

## 2021-04-22 NOTE — ED Provider Notes (Signed)
?[place] DEPT ?Provider Note ? ? ?CSN: [id number] ?Arrival date & time: 04/22/21  1347 ? ?  ? ?History ?No chief complaint on file. ? ? ?Sayward Mcwethy is a 56 y.o. female who presents to the ED today with complaint of concern for possible foreign body in throat.  Patient reports that 2 days ago she ate a small piece of ribbon.  She states that she dropped her fork in the process and went down to pick it up and on her way back up she felt like the piece of rib went down the wrong "tube."  Patient states that since that time she has not felt any foreign body sensation in her throat.  She states that she has been able to drink liquids however has been hesitant to try to eat anything.  Last night she attempted to eat 1 bite of cracker and states that immediately after eating and she felt like it got stuck on top of the other piece of rib.  She states she has not tried to eat anything since that time.  She does mention history of similar episode several years ago after eating a piece of hamburger.  She states that she was given medication that "numbed" her throat which helped.  She states that she is being followed by GI currently and is planned to have endoscopy and colonoscopy in May of this year.  She went to urgent care earlier today and was sent here for further evaluation. No other complaints at this time.  ? ?The history is provided by the patient and medical records.  ? ?  ? ?Home Medications ?Prior to Admission medications   ?Medication Sig Start Date End Date Taking? Authorizing Provider  ?lubiprostone (AMITIZA) 8 MCG capsule Take 8 mcg by mouth 2 (two) times daily with a meal. 03/26/21 06/24/21 Yes [provider]  ?omeprazole (PRILOSEC) 20 MG capsule Take 1 capsule (20 mg total) by mouth daily. 04/22/21 05/22/21 Yes Praneeth Bussey, PA-C  ?acetaminophen (TYLENOL) 500 MG tablet Take 500 mg by mouth every 6 (six) hours as needed for moderate pain.    [provider]   ?acetaminophen-codeine (TYLENOL #3) 300-30 MG tablet Take 1-2 tablets by mouth every 6 (six) hours as needed for moderate pain or severe pain. ?Patient not taking: Reported on 04/22/2021 07/30/20   Wieters, Madelynn Done C, PA-C  ?albuterol (PROVENTIL HFA;VENTOLIN HFA) 108 (90 Base) MCG/ACT inhaler Inhale 2 puffs into the lungs every 6 (six) hours as needed for wheezing or shortness of breath. 04/28/18   Katy Apo, NP  ?aspirin EC 81 MG tablet Take 81 mg by mouth daily.    [provider]  ?azelastine (ASTELIN) 0.1 % nasal spray Place 2 sprays into both nostrils 2 (two) times daily. 02/27/19   Tasia Catchings, Amy V, PA-C  ?budesonide-formoterol (SYMBICORT) 80-4.5 MCG/ACT inhaler Inhale 2 puffs into the lungs daily. 05/09/17   Jaynee Eagles, PA-C  ?cetirizine (ZYRTEC ALLERGY) 10 MG tablet Take 1 tablet (10 mg total) by mouth daily. 02/29/20   Hall-Potvin, [place], PA-C  ?clotrimazole-betamethasone (LOTRISONE) cream Apply to affected area 2 times daily 07/30/20   Wieters, [employer] C, PA-C  ?diclofenac Sodium (VOLTAREN) 1 % GEL Apply 2 g topically 4 (four) times daily. 07/30/20   Wieters, Hallie C, PA-C  ?dicyclomine (BENTYL) 10 MG capsule Take 10 mg by mouth 4 (four) times daily as needed for cramping. 03/27/21   [provider]  ?dicyclomine (BENTYL) 20 MG tablet Take 1 tablet (20 mg total)  by mouth 4 (four) times daily -  before meals and at bedtime. As needed for cramping 07/30/20   Wieters, [employer] C, PA-C  ?fluconazole (DIFLUCAN) 150 MG tablet Take 1 tablet (150 mg total) by mouth once a week. ?Patient not taking: Reported on 04/22/2021 09/24/20   Jaynee Eagles, PA-C  ?fluticasone [facility]) 50 MCG/ACT nasal spray Place 1 spray into both nostrils daily. 01/08/21   Francene Finders, PA-C  ?gabapentin (NEURONTIN) 100 MG capsule Take 300 mg by mouth at bedtime. 03/19/21   [provider]  ?lactulose (CHRONULAC) 10 GM/15ML solution Take 30 mLs by mouth 3 (three) times daily. 03/19/21   [provider]  ?Rolan Lipa 145  MCG CAPS capsule Take 145 mcg by mouth daily. 02/21/21   [provider]  ?metoprolol succinate (TOPROL-XL) 25 MG 24 hr tablet Take 25 mg by mouth daily. 04/09/21   [provider]  ?metroNIDAZOLE (FLAGYL) 500 MG tablet Take 1 tablet (500 mg total) by mouth 2 (two) times daily. ?Patient not taking: Reported on 04/22/2021 09/26/20   Chase Picket, MD  ?   ? ?Allergies    ?Rocephin [ceftriaxone], Buspirone, Butorphanol, Oxycodone-acetaminophen, Propoxyphene, Darvocet [propoxyphene n-acetaminophen], Darvon [propoxyphene hcl], Percocet [oxycodone-acetaminophen], Toradol [ketorolac tromethamine], and Doxycycline   ? ?Review of Systems   ?Review of Systems  ?Constitutional:  Negative for chills and fever.  ?HENT:  Negative for voice change.   ?     + food bolus  ?Respiratory:  Negative for shortness of breath.   ?Cardiovascular:  Negative for chest pain.  ?Gastrointestinal:  Negative for vomiting.  ?All other systems reviewed and are negative. ? ?Physical Exam ?Updated Vital Signs ?BP (!) 155/80   Pulse 66   Temp 98.1 ?F (36.7 ?C) (Oral)   Resp 14   Ht '5\' 4"'$  (1.626 m)   Wt 101.2 kg   LMP 03/09/2016   SpO2 100%   BMI 38.28 kg/m?  ?Physical Exam ?Vitals and nursing note reviewed.  ?Constitutional:   ?   Appearance: She is not ill-appearing.  ?   Comments: Phonating normally. Tolerating secretions without difficulty.   ?HENT:  ?   Head: Normocephalic and atraumatic.  ?   Mouth/Throat:  ?   Pharynx: Oropharynx is clear. No oropharyngeal exudate or posterior oropharyngeal erythema.  ?Eyes:  ?   Conjunctiva/sclera: Conjunctivae normal.  ?Cardiovascular:  ?   Rate and Rhythm: Normal rate and regular rhythm.  ?   Pulses: Normal pulses.  ?Pulmonary:  ?   Effort: Pulmonary effort is normal.  ?   Breath sounds: Normal breath sounds. No wheezing, rhonchi or rales.  ?Abdominal:  ?   Palpations: Abdomen is soft.  ?   Tenderness: There is no abdominal tenderness.  ?Musculoskeletal:  ?   Cervical back: Neck  supple.  ?Skin: ?   General: Skin is warm and dry.  ?Neurological:  ?   Mental Status: She is alert.  ? ? ?ED Results / Procedures / Treatments   ?Labs ?(all labs ordered are listed, but only abnormal results are displayed) ?Labs Reviewed - No data to display ? ?EKG ?EKG Interpretation ? ?Date/Time:  Tuesday April 22 2021 14:57:54 EDT ?Ventricular Rate:  64 ?PR Interval:  147 ?QRS Duration: 106 ?QT Interval:  396 ?QTC Calculation: 409 ?R Axis:   3 ?Text Interpretation: Sinus rhythm Borderline T abnormalities, diffuse leads Confirmed by Dene Gentry [phone]) on 04/22/2021 3:37:07 PM ? ?Radiology ?DG Neck Soft Tissue ? ?Result Date: 04/22/2021 ?CLINICAL DATA:  Sensation of food stuck  in the neck sore throat. EXAM: NECK SOFT TISSUES - 1+ VIEW COMPARISON:  None FINDINGS: Overlying regional artifact. No evidence of soft tissue swelling. Air shadows appear normal. Mild scoliosis of the spine. Ordinary dental and periodontal disease is noted. IMPRESSION: No abnormal radiographic finding. There is extensive overlying artifact however. Electronically Signed   By: Nelson Chimes M.D.   On: 04/22/2021 16:09   ? ?Procedures ?Procedures  ? ? ?Medications Ordered in ED ?Medications  ?glucagon (human recombinant) (GLUCAGEN) injection 1 mg (1 mg Intravenous Given 04/22/21 1530)  ?alum & mag hydroxide-simeth (MAALOX/MYLANTA) 200-200-20 MG/5ML suspension 30 mL (30 mLs Oral Given 04/22/21 1735)  ?  And  ?lidocaine (XYLOCAINE) 2 % viscous mouth solution 15 mL (15 mLs Oral Given 04/22/21 1736)  ? ? ?ED Course/ Medical Decision Making/ A&P ?  ?                        ?Medical Decision Making ?56 year old female who presents to the ED today with concern for food bolus after eating pizza for up 2 days ago.  Has been tolerating secretions/liquid diet without difficulty however having some difficulty with solids.  On arrival to the ED today vitals are stable.  Patient is afebrile, nontachycardic and nontachypneic and appears to be in no acute  distress.  She is phonating normally.  Clear to auscultation bilaterally and posterior oropharynx clear without any signs of foreign body.  We will plan for x-ray for further evaluation to assess for food bolus and

## 2021-04-22 NOTE — ED Triage Notes (Addendum)
Reports Sunday she felt like she ate ribs and felt like something went down the wrong way. Says ever since she felt like something has been stuck in her chest. Denies any coughing/problems breathing, problems swallowing.  ?

## 2021-04-23 DIAGNOSIS — G8929 Other chronic pain: Secondary | ICD-10-CM | POA: Diagnosis not present

## 2021-04-23 DIAGNOSIS — M545 Low back pain, unspecified: Secondary | ICD-10-CM | POA: Diagnosis not present

## 2021-04-23 DIAGNOSIS — M6281 Muscle weakness (generalized): Secondary | ICD-10-CM | POA: Diagnosis not present

## 2021-04-23 DIAGNOSIS — M6289 Other specified disorders of muscle: Secondary | ICD-10-CM | POA: Diagnosis not present

## 2021-04-23 DIAGNOSIS — Z7409 Other reduced mobility: Secondary | ICD-10-CM | POA: Diagnosis not present

## 2021-05-05 DIAGNOSIS — K5901 Slow transit constipation: Secondary | ICD-10-CM | POA: Diagnosis not present

## 2021-05-05 DIAGNOSIS — K921 Melena: Secondary | ICD-10-CM | POA: Diagnosis not present

## 2021-05-05 DIAGNOSIS — R634 Abnormal weight loss: Secondary | ICD-10-CM | POA: Diagnosis not present

## 2021-05-12 DIAGNOSIS — M47816 Spondylosis without myelopathy or radiculopathy, lumbar region: Secondary | ICD-10-CM | POA: Diagnosis not present

## 2021-05-12 DIAGNOSIS — R200 Anesthesia of skin: Secondary | ICD-10-CM | POA: Diagnosis not present

## 2021-05-12 DIAGNOSIS — R0683 Snoring: Secondary | ICD-10-CM | POA: Diagnosis not present

## 2021-05-21 DIAGNOSIS — K5901 Slow transit constipation: Secondary | ICD-10-CM | POA: Diagnosis not present

## 2021-05-21 DIAGNOSIS — G8929 Other chronic pain: Secondary | ICD-10-CM | POA: Diagnosis not present

## 2021-05-21 DIAGNOSIS — D649 Anemia, unspecified: Secondary | ICD-10-CM | POA: Diagnosis not present

## 2021-05-26 DIAGNOSIS — D509 Iron deficiency anemia, unspecified: Secondary | ICD-10-CM | POA: Diagnosis not present

## 2021-05-26 DIAGNOSIS — D508 Other iron deficiency anemias: Secondary | ICD-10-CM | POA: Diagnosis not present

## 2021-05-27 DIAGNOSIS — D500 Iron deficiency anemia secondary to blood loss (chronic): Secondary | ICD-10-CM | POA: Diagnosis not present

## 2021-06-03 DIAGNOSIS — D500 Iron deficiency anemia secondary to blood loss (chronic): Secondary | ICD-10-CM | POA: Diagnosis not present

## 2021-06-23 DIAGNOSIS — K5901 Slow transit constipation: Secondary | ICD-10-CM | POA: Diagnosis not present

## 2021-06-23 DIAGNOSIS — I10 Essential (primary) hypertension: Secondary | ICD-10-CM | POA: Diagnosis not present

## 2021-06-23 DIAGNOSIS — D500 Iron deficiency anemia secondary to blood loss (chronic): Secondary | ICD-10-CM | POA: Diagnosis not present

## 2021-06-23 DIAGNOSIS — E559 Vitamin D deficiency, unspecified: Secondary | ICD-10-CM | POA: Diagnosis not present

## 2021-06-23 DIAGNOSIS — Z13220 Encounter for screening for lipoid disorders: Secondary | ICD-10-CM | POA: Diagnosis not present

## 2021-06-23 DIAGNOSIS — J4520 Mild intermittent asthma, uncomplicated: Secondary | ICD-10-CM | POA: Diagnosis not present

## 2021-06-23 DIAGNOSIS — F419 Anxiety disorder, unspecified: Secondary | ICD-10-CM | POA: Diagnosis not present

## 2021-06-23 DIAGNOSIS — Z1329 Encounter for screening for other suspected endocrine disorder: Secondary | ICD-10-CM | POA: Diagnosis not present

## 2021-06-25 DIAGNOSIS — D509 Iron deficiency anemia, unspecified: Secondary | ICD-10-CM | POA: Diagnosis not present

## 2021-06-25 DIAGNOSIS — K633 Ulcer of intestine: Secondary | ICD-10-CM | POA: Diagnosis not present

## 2021-06-25 DIAGNOSIS — K648 Other hemorrhoids: Secondary | ICD-10-CM | POA: Diagnosis not present

## 2021-06-25 DIAGNOSIS — D124 Benign neoplasm of descending colon: Secondary | ICD-10-CM | POA: Diagnosis not present

## 2021-06-25 DIAGNOSIS — K5730 Diverticulosis of large intestine without perforation or abscess without bleeding: Secondary | ICD-10-CM | POA: Diagnosis not present

## 2021-06-25 DIAGNOSIS — D0149 Carcinoma in situ of other parts of intestine: Secondary | ICD-10-CM | POA: Diagnosis not present

## 2021-06-25 DIAGNOSIS — K921 Melena: Secondary | ICD-10-CM | POA: Diagnosis not present

## 2021-06-25 DIAGNOSIS — D010 Carcinoma in situ of colon: Secondary | ICD-10-CM | POA: Diagnosis not present

## 2021-06-25 DIAGNOSIS — K6389 Other specified diseases of intestine: Secondary | ICD-10-CM | POA: Diagnosis not present

## 2021-06-25 DIAGNOSIS — D122 Benign neoplasm of ascending colon: Secondary | ICD-10-CM | POA: Diagnosis not present

## 2021-06-30 DIAGNOSIS — C186 Malignant neoplasm of descending colon: Secondary | ICD-10-CM | POA: Diagnosis not present

## 2021-06-30 DIAGNOSIS — K6389 Other specified diseases of intestine: Secondary | ICD-10-CM | POA: Diagnosis not present

## 2021-06-30 DIAGNOSIS — C189 Malignant neoplasm of colon, unspecified: Secondary | ICD-10-CM | POA: Diagnosis not present

## 2021-06-30 DIAGNOSIS — K5790 Diverticulosis of intestine, part unspecified, without perforation or abscess without bleeding: Secondary | ICD-10-CM | POA: Diagnosis not present

## 2021-06-30 DIAGNOSIS — K921 Melena: Secondary | ICD-10-CM | POA: Diagnosis not present

## 2021-07-02 DIAGNOSIS — C186 Malignant neoplasm of descending colon: Secondary | ICD-10-CM | POA: Diagnosis not present

## 2021-07-02 DIAGNOSIS — K6389 Other specified diseases of intestine: Secondary | ICD-10-CM | POA: Diagnosis not present

## 2021-07-09 DIAGNOSIS — C186 Malignant neoplasm of descending colon: Secondary | ICD-10-CM | POA: Diagnosis not present

## 2021-07-24 DIAGNOSIS — Z79899 Other long term (current) drug therapy: Secondary | ICD-10-CM | POA: Diagnosis not present

## 2021-07-24 DIAGNOSIS — C186 Malignant neoplasm of descending colon: Secondary | ICD-10-CM | POA: Diagnosis not present

## 2021-07-24 DIAGNOSIS — Z01812 Encounter for preprocedural laboratory examination: Secondary | ICD-10-CM | POA: Diagnosis not present

## 2021-07-24 DIAGNOSIS — E559 Vitamin D deficiency, unspecified: Secondary | ICD-10-CM | POA: Diagnosis not present

## 2021-08-07 DIAGNOSIS — I10 Essential (primary) hypertension: Secondary | ICD-10-CM | POA: Diagnosis not present

## 2021-08-07 DIAGNOSIS — C184 Malignant neoplasm of transverse colon: Secondary | ICD-10-CM | POA: Diagnosis not present

## 2021-08-07 DIAGNOSIS — Z79899 Other long term (current) drug therapy: Secondary | ICD-10-CM | POA: Diagnosis not present

## 2021-08-07 DIAGNOSIS — Z8249 Family history of ischemic heart disease and other diseases of the circulatory system: Secondary | ICD-10-CM | POA: Diagnosis not present

## 2021-08-07 DIAGNOSIS — K5730 Diverticulosis of large intestine without perforation or abscess without bleeding: Secondary | ICD-10-CM | POA: Diagnosis not present

## 2021-08-07 DIAGNOSIS — F32A Depression, unspecified: Secondary | ICD-10-CM | POA: Diagnosis not present

## 2021-08-07 DIAGNOSIS — K648 Other hemorrhoids: Secondary | ICD-10-CM | POA: Diagnosis not present

## 2021-08-07 DIAGNOSIS — E669 Obesity, unspecified: Secondary | ICD-10-CM | POA: Diagnosis not present

## 2021-08-07 DIAGNOSIS — K560 Paralytic ileus: Secondary | ICD-10-CM | POA: Diagnosis not present

## 2021-08-07 DIAGNOSIS — Z7951 Long term (current) use of inhaled steroids: Secondary | ICD-10-CM | POA: Diagnosis not present

## 2021-08-07 DIAGNOSIS — C186 Malignant neoplasm of descending colon: Secondary | ICD-10-CM | POA: Diagnosis not present

## 2021-08-07 DIAGNOSIS — Z6834 Body mass index (BMI) 34.0-34.9, adult: Secondary | ICD-10-CM | POA: Diagnosis not present

## 2021-08-07 DIAGNOSIS — J4520 Mild intermittent asthma, uncomplicated: Secondary | ICD-10-CM | POA: Diagnosis not present

## 2021-08-27 ENCOUNTER — Ambulatory Visit
Admission: EM | Admit: 2021-08-27 | Discharge: 2021-08-27 | Disposition: A | Discharge status: Home / Self Care | Payer: Medicare Other | Attending: Internal Medicine | Admitting: Internal Medicine

## 2021-08-27 DIAGNOSIS — K644 Residual hemorrhoidal skin tags: Secondary | ICD-10-CM | POA: Diagnosis not present

## 2021-08-27 MED ORDER — HYDROCORTISONE (PERIANAL) 2.5 % EX CREA: 1.0000 | TOPICAL_CREAM | Freq: Two times a day (BID) | CUTANEOUS | 0 refills | Status: DC

## 2021-08-27 NOTE — ED Provider Notes (Signed)
[facility]    CSN: [id number] Arrival date & time: 08/27/21  1938      History   Chief Complaint Chief Complaint  Patient presents with   Hemorrhoids    HPI Sarah Mathews is a 56 y.o. female.   Patient presents with concerns of hemorrhoids that has been present for about 2 to 3 days.  Patient reports pain in the rectum area.  She states that she had a surgery (a colectomy) on 6/29.  She has been having diarrhea post surgery which she reports she was told was normal.  Although, given persistent diarrhea she thinks that hemorrhoids have been caused due to this.  She reports 1 episode of blood to the hemorrhoid when wiping.  Patient is still able to defecate.  Patient has used over-the-counter wipes with minimal improvement.     Past Medical History:  Diagnosis Date   Depressed    Fibroids    GERD (gastroesophageal reflux disease)    Hypertension    Obesity     Patient Active Problem List   Diagnosis Date Noted   Asthmatic bronchitis 09/03/2016   SUI (stress urinary incontinence, female) 02/06/2016   Dysmenorrhea 12/25/2015   Abnormal uterine bleeding (AUB) 12/25/2015   Perimenopausal 12/25/2015   Metatarsal deformity 11/09/2014   Pain, foot 02/07/2013   Equinus deformity of foot, acquired 02/07/2013   Tenosynovitis of foot and ankle 02/07/2013    Past Surgical History:  Procedure Laterality Date   DILATION AND CURETTAGE OF UTERUS  09/2006   TUBAL LIGATION      OB History     Gravida  17   Para  7   Term  6   Preterm  1   AB  3   Living  7      SAB  2   IAB      Ectopic  1   Multiple  0   Live Births  7            Home Medications    Prior to Admission medications   Medication Sig Start Date End Date Taking? Authorizing Provider  hydrocortisone (PROCTOSOL HC) 2.5 % rectal cream Place 1 Application rectally 2 (two) times daily. 08/27/21  Yes Beck Cofer, Hildred Alamin E, FNP  acetaminophen (TYLENOL) 500 MG tablet Take 500 mg by mouth  every 6 (six) hours as needed for moderate pain.    [provider]  acetaminophen-codeine (TYLENOL #3) 300-30 MG tablet Take 1-2 tablets by mouth every 6 (six) hours as needed for moderate pain or severe pain. Patient not taking: Reported on 04/22/2021 07/30/20   Wieters, Hallie C, PA-C  albuterol (PROVENTIL HFA;VENTOLIN HFA) 108 (90 Base) MCG/ACT inhaler Inhale 2 puffs into the lungs every 6 (six) hours as needed for wheezing or shortness of breath. 04/28/18   Katy Apo, NP  aspirin EC 81 MG tablet Take 81 mg by mouth daily.    [provider]  azelastine (ASTELIN) 0.1 % nasal spray Place 2 sprays into both nostrils 2 (two) times daily. 02/27/19   Tasia Catchings, Amy V, PA-C  budesonide-formoterol (SYMBICORT) 80-4.5 MCG/ACT inhaler Inhale 2 puffs into the lungs daily. 05/09/17   Jaynee Eagles, PA-C  cetirizine (ZYRTEC ALLERGY) 10 MG tablet Take 1 tablet (10 mg total) by mouth daily. 02/29/20   Hall-Potvin, [place], PA-C  clotrimazole-betamethasone (LOTRISONE) cream Apply to affected area 2 times daily 07/30/20   Wieters, [employer] C, PA-C  diclofenac Sodium (VOLTAREN) 1 % GEL Apply 2 g topically 4 (four)  times daily. 07/30/20   Wieters, Hallie C, PA-C  dicyclomine (BENTYL) 10 MG capsule Take 10 mg by mouth 4 (four) times daily as needed for cramping. 03/27/21   [provider]  dicyclomine (BENTYL) 20 MG tablet Take 1 tablet (20 mg total) by mouth 4 (four) times daily -  before meals and at bedtime. As needed for cramping 07/30/20   Wieters, Hallie C, PA-C  fluconazole (DIFLUCAN) 150 MG tablet Take 1 tablet (150 mg total) by mouth once a week. Patient not taking: Reported on 04/22/2021 09/24/20   Jaynee Eagles, PA-C  fluticasone [facility]) 50 MCG/ACT nasal spray Place 1 spray into both nostrils daily. 01/08/21   Francene Finders, PA-C  gabapentin (NEURONTIN) 100 MG capsule Take 300 mg by mouth at bedtime. 03/19/21   [provider]  lactulose (CHRONULAC) 10 GM/15ML solution Take 30 mLs by  mouth 3 (three) times daily. 03/19/21   [provider]  LINZESS 145 MCG CAPS capsule Take 145 mcg by mouth daily. 02/21/21   [provider]  metoprolol succinate (TOPROL-XL) 25 MG 24 hr tablet Take 25 mg by mouth daily. 04/09/21   [provider]  metroNIDAZOLE (FLAGYL) 500 MG tablet Take 1 tablet (500 mg total) by mouth 2 (two) times daily. Patient not taking: Reported on 04/22/2021 09/26/20   Chase Picket, MD  omeprazole (PRILOSEC) 20 MG capsule Take 1 capsule (20 mg total) by mouth daily. 04/22/21 05/22/21  Eustaquio Maize, PA-C    Family History Family History  Problem Relation Age of Onset   Heart failure Mother     Social History Social History   Tobacco Use   Smoking status: Never   Smokeless tobacco: Never  Substance Use Topics   Alcohol use: Yes   Drug use: No     Allergies   Rocephin [ceftriaxone], Buspirone, Butorphanol, Oxycodone-acetaminophen, Propoxyphene, Darvocet [propoxyphene n-acetaminophen], Darvon [propoxyphene hcl], Percocet [oxycodone-acetaminophen], Toradol [ketorolac tromethamine], and Doxycycline   Review of Systems Review of Systems Per HPI  Physical Exam Triage Vital Signs ED Triage Vitals [08/27/21 1947]  Enc Vitals Group     BP 107/70     Pulse Rate 75     Resp 18     Temp 98 F (36.7 C)     Temp Source Oral     SpO2 98 %     Weight      Height      Head Circumference      Peak Flow      Pain Score 0     Pain Loc      Pain Edu?      Excl. in [place]?    No data found.  Updated Vital Signs BP 107/70 (BP Location: Left Arm)   Pulse 75   Temp 98 F (36.7 C) (Oral)   Resp 18   LMP 03/09/2016   SpO2 98%   Visual Acuity Right Eye Distance:   Left Eye Distance:   Bilateral Distance:    Right Eye Near:   Left Eye Near:    Bilateral Near:     Physical Exam Exam conducted with a chaperone present.  Constitutional:      General: She is not in acute distress.    Appearance: Normal appearance. She is not  toxic-appearing or diaphoretic.  HENT:     Head: Normocephalic and atraumatic.  Eyes:     Extraocular Movements: Extraocular movements intact.     Conjunctiva/sclera: Conjunctivae normal.  Pulmonary:     Effort: Pulmonary  effort is normal.  Genitourinary:    Rectum: External hemorrhoid present.  Neurological:     General: No focal deficit present.     Mental Status: She is alert and oriented to person, place, and time. Mental status is at baseline.  Psychiatric:        Mood and Affect: Mood normal.        Behavior: Behavior normal.        Thought Content: Thought content normal.        Judgment: Judgment normal.      UC Treatments / Results  Labs (all labs ordered are listed, but only abnormal results are displayed) Labs Reviewed - No data to display  EKG   Radiology No results found.  Procedures Procedures (including critical care time)  Medications Ordered in UC Medications - No data to display  Initial Impression / Assessment and Plan / UC Course  I have reviewed the triage vital signs and the nursing notes.  Pertinent labs & imaging results that were available during my care of the patient were reviewed by me and considered in my medical decision making (see chart for details).     Patient has external hemorrhoid that is not thrombosed.  It does not appear to be bleeding.  Suspect patient's blood when wiping was due to hemorrhoid and not complications related to previous surgery.  Will treat with Proctosol topically.  Discussed supportive care for hemorrhoids with patient as well.  Patient provided with general surgery contact information if hemorrhoids persist or worsen.  Discussed return and ER precautions.  Patient verbalized understanding and was agreeable with plan. Final Clinical Impressions(s) / UC Diagnoses   Final diagnoses:  External hemorrhoids     Discharge Instructions      You have been prescribed a cream to apply directly to hemorrhoids to  help decrease inflammation, size, pain.  Recommend sitz bath's but do not submerge in water given recent surgery.  Follow-up with general surgery at provided contact information if symptoms persist or worsen.    ED Prescriptions     Medication Sig Dispense Auth. Provider   hydrocortisone (PROCTOSOL HC) 2.5 % rectal cream Place 1 Application rectally 2 (two) times daily. 30 g Teodora Medici, [place]      PDMP not reviewed this encounter.   Teodora Medici, [place] 08/27/21 [phone]

## 2021-08-27 NOTE — Discharge Instructions (Signed)
You have been prescribed a cream to apply directly to hemorrhoids to help decrease inflammation, size, pain.  Recommend sitz bath's but do not submerge in water given recent surgery.  Follow-up with general surgery at provided contact information if symptoms persist or worsen.

## 2021-08-27 NOTE — ED Triage Notes (Signed)
Pt here for hemorrhoids x2-3 days

## 2021-09-16 ENCOUNTER — Emergency Department (HOSPITAL_COMMUNITY)
Admission: EM | Admit: 2021-09-16 | Discharge: 2021-09-16 | Disposition: A | Discharge status: Home / Self Care | Payer: Medicare Other | Attending: Emergency Medicine | Admitting: Emergency Medicine

## 2021-09-16 ENCOUNTER — Other Ambulatory Visit: Payer: Self-pay

## 2021-09-16 ENCOUNTER — Encounter (HOSPITAL_COMMUNITY): Payer: Self-pay

## 2021-09-16 DIAGNOSIS — Z85038 Personal history of other malignant neoplasm of large intestine: Secondary | ICD-10-CM | POA: Insufficient documentation

## 2021-09-16 DIAGNOSIS — K645 Perianal venous thrombosis: Secondary | ICD-10-CM | POA: Insufficient documentation

## 2021-09-16 DIAGNOSIS — K644 Residual hemorrhoidal skin tags: Secondary | ICD-10-CM

## 2021-09-16 DIAGNOSIS — K6289 Other specified diseases of anus and rectum: Secondary | ICD-10-CM | POA: Diagnosis present

## 2021-09-16 DIAGNOSIS — I10 Essential (primary) hypertension: Secondary | ICD-10-CM | POA: Diagnosis not present

## 2021-09-16 LAB — COMPREHENSIVE METABOLIC PANEL
ALT: 29 U/L (ref 0–44)
AST: 23 U/L (ref 15–41)
Albumin: 3.5 g/dL (ref 3.5–5.0)
Alkaline Phosphatase: 65 U/L (ref 38–126)
Anion gap: 6 (ref 5–15)
BUN: 14 mg/dL (ref 6–20)
CO2: 30 mmol/L (ref 22–32)
Calcium: 8.2 mg/dL — ABNORMAL LOW (ref 8.9–10.3)
Chloride: 104 mmol/L (ref 98–111)
Creatinine, Ser: 1.01 mg/dL — ABNORMAL HIGH (ref 0.44–1.00)
GFR, Estimated: >60 mL/min (ref >60)
Glucose, Bld: 112 mg/dL — ABNORMAL HIGH (ref 70–99)
Potassium: 3.2 mmol/L — ABNORMAL LOW (ref 3.5–5.1)
Sodium: 140 mmol/L (ref 135–145)
Total Bilirubin: 0.6 mg/dL (ref 0.3–1.2)
Total Protein: 7 g/dL (ref 6.5–8.1)

## 2021-09-16 LAB — CBC WITH DIFFERENTIAL/PLATELET
Abs Immature Granulocytes: 0.01 10*3/uL (ref 0.00–0.07)
Basophils Absolute: 0 10*3/uL (ref 0.0–0.1)
Basophils Relative: 0 %
Eosinophils Absolute: 0.1 10*3/uL (ref 0.0–0.5)
Eosinophils Relative: 2 %
HCT: 29.1 % — ABNORMAL LOW (ref 36.0–46.0)
Hemoglobin: 9.5 g/dL — ABNORMAL LOW (ref 12.0–15.0)
Immature Granulocytes: 0 %
Lymphocytes Relative: 33 %
Lymphs Abs: 1.5 10*3/uL (ref 0.7–4.0)
MCH: 27.1 pg (ref 26.0–34.0)
MCHC: 32.6 g/dL (ref 30.0–36.0)
MCV: 83.1 fL (ref 80.0–100.0)
Monocytes Absolute: 0.4 10*3/uL (ref 0.1–1.0)
Monocytes Relative: 9 %
Neutro Abs: 2.5 10*3/uL (ref 1.7–7.7)
Neutrophils Relative %: 56 %
Platelets: 264 10*3/uL (ref 150–400)
RBC: 3.5 MIL/uL — ABNORMAL LOW (ref 3.87–5.11)
RDW: 13.2 % (ref 11.5–15.5)
WBC: 4.6 10*3/uL (ref 4.0–10.5)
nRBC: 0 % (ref 0.0–0.2)

## 2021-09-16 MED ORDER — HYDROCORTISONE ACETATE 25 MG RE SUPP: 25.0000 mg | Freq: Two times a day (BID) | RECTAL | 0 refills | Status: AC | PRN

## 2021-09-16 NOTE — ED Triage Notes (Signed)
Patient c/o hemorrhoid pain since 08/27/21. Patient states she has used prescribed medication from an UC that she saw on 08/27/21 and OTC hemorrhoid cream with no relief.

## 2021-09-16 NOTE — ED Notes (Signed)
Pt states understanding of dc instructions, importance of follow up, work note, and prescription. Pt denies questions or concerns upon dc. Pt declined wheelchair assistance upon dc. Pt ambulated out of ed w/ steady gait. No belongings left in room upon dc.

## 2021-09-16 NOTE — ED Provider Notes (Signed)
Emergency Department Provider Note   I have reviewed the triage vital signs and the nursing notes.   HISTORY  Chief Complaint Hemorrhoids   HPI Tinleigh Whitmire is a 56 y.o. female with past history of GERD, hypertension, and malignant neoplasm of the transverse colon status post laparoscopic partial colectomy (08/07/21) presents to the emergency department for evaluation of continued rectal pain and presumed diagnosis of hemorrhoids.  Patient with pain over the past several weeks.  She was seen at urgent care on 7/19 with similar presentation.  She has been taking topical hemorrhoid cream both prescription and over-the-counter with continued symptoms.  She is passing bowel movements.  She denies fever.  She is not seeing bright red blood or black/melena. She has a follow up appointment with her surgery team at [facility] tomorrow.    Past Medical History:  Diagnosis Date   Depressed    Fibroids    GERD (gastroesophageal reflux disease)    Hypertension    Obesity     Review of Systems  Constitutional: No fever/chills Cardiovascular: Denies chest pain. Respiratory: Denies shortness of breath. Gastrointestinal: No abdominal pain.  No nausea, no vomiting.  No diarrhea.  No constipation. Positive rectal pain.  Genitourinary: Negative for dysuria. Musculoskeletal: Negative for back pain. Skin: Negative for rash. Neurological: Negative for headaches.   ____________________________________________   PHYSICAL EXAM:  VITAL SIGNS: ED Triage Vitals  Enc Vitals Group     BP 09/16/21 1024 106/72     Pulse Rate 09/16/21 1024 86     Resp 09/16/21 1024 18     Temp 09/16/21 1024 97.6 F (36.4 C)     Temp Source 09/16/21 1024 Oral     SpO2 09/16/21 1024 100 %     Weight --      Height 09/16/21 1024 '5\' 4"'$  (1.626 m)   Constitutional: Alert and oriented. Well appearing and in no acute distress. Eyes: Conjunctivae are normal.  Head: Atraumatic. Nose: No  congestion/rhinnorhea. Mouth/Throat: Mucous membranes are moist.   Neck: No stridor.   Cardiovascular: Normal rate, regular rhythm. Good peripheral circulation. Grossly normal heart sounds.   Respiratory: Normal respiratory effort.  No retractions. Lungs CTAB. Gastrointestinal: Soft and nontender.  Well-appearing laparoscopic incisions on the abdomen. No distention.  Rectal exam performed with patient's verbal consent and nurse chaperone at bedside.  Patient has small, nonthrombosed external hemorrhoids which are visible at the 3 o'clock position.  No surrounding cellulitis, induration, evidence of abscess. Musculoskeletal: No lower extremity tenderness nor edema. No gross deformities of extremities. Neurologic:  Normal speech and language. No gross focal neurologic deficits are appreciated.  Skin:  Skin is warm, dry and intact. No rash noted.  ____________________________________________   LABS (all labs ordered are listed, but only abnormal results are displayed)  Labs Reviewed  COMPREHENSIVE METABOLIC PANEL - Abnormal; Notable for the following components:      Result Value   Potassium 3.2 (*)    Glucose, Bld 112 (*)    Creatinine, Ser 1.01 (*)    Calcium 8.2 (*)    All other components within normal limits  CBC WITH DIFFERENTIAL/PLATELET - Abnormal; Notable for the following components:   RBC 3.50 (*)    Hemoglobin 9.5 (*)    HCT 29.1 (*)    All other components within normal limits   ____________________________________________   PROCEDURES  Procedure(s) performed:   Procedures  None  ____________________________________________   INITIAL IMPRESSION / ASSESSMENT AND PLAN / ED COURSE  Pertinent labs &  imaging results that were available during my care of the patient were reviewed by me and considered in my medical decision making (see chart for details).   This patient is Presenting for Evaluation of rectal pain, which does require a range of treatment options, and  is a complaint that involves a high risk of morbidity and mortality.  The Differential Diagnoses include symptomatic external hemorrhoid, rectal mass, abscess, fissure, etc.   I decided to review pertinent External Data, and in summary patient with partial colectomy in the [facility] system.  Operative note reviewed from 6/29.   Clinical Laboratory Tests Ordered, included CBC without leukocytosis. No AKI. Normal LFTs.   Radiologic Tests: Considered CT of the pelvis with contrast to rule out deeper space process such as abscess although patient has relative lack of symptoms regarding this other than pain.  We discussed imaging here in the emergency department and patient's strong preference is to defer any imaging until she can see her surgeon tomorrow.  She has an appointment and given my exam I do feel reasonably comfortable deferring imaging.   Medical Decision Making: Summary:  Patient presents emergency department with rectal pain.  She has an external hemorrhoid although does not appear thrombosed.  There is no perianal cellulitis or visible abscess.  No purulence.  Not appreciate a fissure.  She has an appointment with her general surgery team tomorrow.  As above, imaging was considered but patient's strong preference is to defer this until she can be evaluated tomorrow by her surgeon which seems reasonable. Will obtain screening labs.   Reevaluation with update and discussion with patient.  Lab work here is reassuring.  Plan to treat empirically based on my exam and she will keep her appointment with her general surgery team tomorrow.  Discussed strict ED return precautions.  Disposition: discharge  ____________________________________________  FINAL CLINICAL IMPRESSION(S) / ED DIAGNOSES  Final diagnoses:  Rectal pain  External hemorrhoid     NEW OUTPATIENT MEDICATIONS STARTED DURING THIS VISIT:  New Prescriptions   HYDROCORTISONE (ANUSOL-HC) 25 MG SUPPOSITORY    Place 1  suppository (25 mg total) rectally 2 (two) times daily as needed for hemorrhoids or anal itching.    Note:  This document was prepared using Dragon voice recognition software and may include unintentional dictation errors.  Nanda Quinton, MD, [facility] Emergency Medicine    Meridee Branum, [facility], MD 09/16/21 1229

## 2021-09-16 NOTE — Discharge Instructions (Signed)
Please keep your appointment with your surgeon tomorrow.  If you develop fever, severe bleeding, abdominal pain you should return to the emergency department.  I have called in a hemorrhoid suppository and will have you see your surgeon to consider the need for further imaging at tomorrow's visit.

## 2021-09-30 ENCOUNTER — Encounter (HOSPITAL_BASED_OUTPATIENT_CLINIC_OR_DEPARTMENT_OTHER): Payer: Self-pay

## 2021-09-30 ENCOUNTER — Emergency Department (HOSPITAL_BASED_OUTPATIENT_CLINIC_OR_DEPARTMENT_OTHER)
Admission: EM | Admit: 2021-09-30 | Discharge: 2021-09-30 | Disposition: A | Discharge status: Home / Self Care | Payer: Medicare Other | Attending: Emergency Medicine | Admitting: Emergency Medicine

## 2021-09-30 DIAGNOSIS — Z7982 Long term (current) use of aspirin: Secondary | ICD-10-CM | POA: Insufficient documentation

## 2021-09-30 DIAGNOSIS — K644 Residual hemorrhoidal skin tags: Secondary | ICD-10-CM | POA: Diagnosis not present

## 2021-09-30 DIAGNOSIS — K6289 Other specified diseases of anus and rectum: Secondary | ICD-10-CM

## 2021-09-30 MED ORDER — OXYCODONE-ACETAMINOPHEN 5-325 MG PO TABS
1.0000 | ORAL_TABLET | Freq: Once | ORAL | Status: AC
  Administered 2021-09-30: 1 via ORAL
  Filled 2021-09-30: qty 1

## 2021-09-30 MED ORDER — OXYCODONE-ACETAMINOPHEN 5-325 MG PO TABS: 1.0000 | ORAL_TABLET | Freq: Four times a day (QID) | ORAL | 0 refills | Status: DC | PRN

## 2021-09-30 MED ORDER — ONDANSETRON 4 MG PO TBDP
4.0000 mg | ORAL_TABLET | Freq: Once | ORAL | Status: AC
Start: 2021-09-30 — End: 2021-09-30
  Administered 2021-09-30: 4 mg via ORAL
  Filled 2021-09-30: qty 1

## 2021-09-30 NOTE — ED Provider Notes (Signed)
[facility] EMERGENCY DEPT Provider Note   CSN: [id number] Arrival date & time: 09/30/21  1936     History  No chief complaint on file.   Sarah Mathews is a 56 y.o. female.  Patient presents emergency department for evaluation of rectal pain.  Patient has been dealing with hemorrhoids for more than a month after having colon surgery recently.  She has been diagnosed and treated for rectal fissure as well as hemorrhoids.  She has tried topical and suppository hydrocortisone, topical diltiazem, Vaseline, lidocaine gel without improvement.  Patient's current complaint is burning pain.  She states that after her surgery her stools were liquid, however are becoming more solid as time goes on.  Pain is worse with bowel movements.  No abdominal pain anteriorly.  No fevers.       Home Medications Prior to Admission medications   Medication Sig Start Date End Date Taking? Authorizing Provider  oxyCODONE-acetaminophen (PERCOCET/ROXICET) 5-325 MG tablet Take 1 tablet by mouth every 6 (six) hours as needed for severe pain. 09/30/21  Yes Carlisle Cater, PA-C  albuterol (PROVENTIL HFA;VENTOLIN HFA) 108 (90 Base) MCG/ACT inhaler Inhale 2 puffs into the lungs every 6 (six) hours as needed for wheezing or shortness of breath. 04/28/18   Katy Apo, NP  aspirin EC 81 MG tablet Take 81 mg by mouth daily.    [provider]  azelastine (ASTELIN) 0.1 % nasal spray Place 2 sprays into both nostrils 2 (two) times daily. 02/27/19   Tasia Catchings, Amy V, PA-C  budesonide-formoterol (SYMBICORT) 80-4.5 MCG/ACT inhaler Inhale 2 puffs into the lungs daily. 05/09/17   Jaynee Eagles, PA-C  cetirizine (ZYRTEC ALLERGY) 10 MG tablet Take 1 tablet (10 mg total) by mouth daily. 02/29/20   Hall-Potvin, [place], PA-C  clotrimazole-betamethasone (LOTRISONE) cream Apply to affected area 2 times daily 07/30/20   Wieters, [employer] C, PA-C  diclofenac Sodium (VOLTAREN) 1 % GEL Apply 2 g topically 4 (four) times daily.  07/30/20   Wieters, Hallie C, PA-C  dicyclomine (BENTYL) 10 MG capsule Take 10 mg by mouth 4 (four) times daily as needed for cramping. 03/27/21   [provider]  dicyclomine (BENTYL) 20 MG tablet Take 1 tablet (20 mg total) by mouth 4 (four) times daily -  before meals and at bedtime. As needed for cramping 07/30/20   Wieters, Hallie C, PA-C  fluconazole (DIFLUCAN) 150 MG tablet Take 1 tablet (150 mg total) by mouth once a week. Patient not taking: Reported on 04/22/2021 09/24/20   Jaynee Eagles, PA-C  fluticasone [facility]) 50 MCG/ACT nasal spray Place 1 spray into both nostrils daily. 01/08/21   Francene Finders, PA-C  gabapentin (NEURONTIN) 100 MG capsule Take 300 mg by mouth at bedtime. 03/19/21   [provider]  hydrocortisone (ANUSOL-HC) 25 MG suppository Place 1 suppository (25 mg total) rectally 2 (two) times daily as needed for hemorrhoids or anal itching. 09/16/21   Long, Wonda [employer], MD  hydrocortisone (PROCTOSOL HC) 2.5 % rectal cream Place 1 Application rectally 2 (two) times daily. 08/27/21   Teodora Medici, FNP  lactulose (CHRONULAC) 10 GM/15ML solution Take 30 mLs by mouth 3 (three) times daily. 03/19/21   [provider]  LINZESS 145 MCG CAPS capsule Take 145 mcg by mouth daily. 02/21/21   [provider]  metoprolol succinate (TOPROL-XL) 25 MG 24 hr tablet Take 25 mg by mouth daily. 04/09/21   [provider]  metroNIDAZOLE (FLAGYL) 500 MG tablet Take 1 tablet (500 mg total) by  mouth 2 (two) times daily. Patient not taking: Reported on 04/22/2021 09/26/20   Chase Picket, MD  omeprazole (PRILOSEC) 20 MG capsule Take 1 capsule (20 mg total) by mouth daily. 04/22/21 05/22/21  Eustaquio Maize, PA-C      Allergies    Rocephin [ceftriaxone], Buspirone, Butorphanol, Oxycodone-acetaminophen, Propoxyphene, Alprazolam, Clonazepam, Darvocet [propoxyphene n-acetaminophen], Darvon [propoxyphene hcl], Diazepam, Percocet [oxycodone-acetaminophen], Toradol [ketorolac  tromethamine], and Doxycycline    Review of Systems   Review of Systems  Physical Exam Updated Vital Signs BP 111/67 (BP Location: Right Arm)   Pulse 79   Temp 98 F (36.7 C) (Oral)   Resp 16   Ht '5\' 4"'$  (1.626 m)   Wt 81.2 kg   LMP 03/09/2016   SpO2 100%   BMI 30.73 kg/m   Physical Exam Vitals and nursing note reviewed. Exam conducted with a chaperone present (Issata PA-S2).  Constitutional:      General: She is not in acute distress.    Appearance: She is well-developed.  HENT:     Head: Normocephalic and atraumatic.     Right Ear: External ear normal.     Left Ear: External ear normal.     Nose: Nose normal.  Eyes:     Conjunctiva/sclera: Conjunctivae normal.  Cardiovascular:     Rate and Rhythm: Normal rate and regular rhythm.     Heart sounds: No murmur heard. Pulmonary:     Effort: No respiratory distress.     Breath sounds: No wheezing, rhonchi or rales.  Abdominal:     Palpations: Abdomen is soft.     Tenderness: There is no abdominal tenderness. There is no guarding or rebound.  Genitourinary:    Exam position: Knee-chest position.     Rectum: External hemorrhoid present. No anal fissure.     Comments: Patient has generalized inflammation of the skin of the buttocks surrounding the rectum.  I do not see an obvious rectal fissure.  She does have some firm but not obviously thrombosed external hemorrhoids which are tender to palpation.  No active bleeding. Musculoskeletal:     Cervical back: Normal range of motion and neck supple.     Right lower leg: No edema.     Left lower leg: No edema.  Skin:    General: Skin is warm and dry.     Findings: No rash.  Neurological:     General: No focal deficit present.     Mental Status: She is alert. Mental status is at baseline.     Motor: No weakness.  Psychiatric:        Mood and Affect: Mood normal.     ED Results / Procedures / Treatments   Labs (all labs ordered are listed, but only abnormal results are  displayed) Labs Reviewed - No data to display  EKG None  Radiology No results found.  Procedures Procedures    Medications Ordered in ED Medications  oxyCODONE-acetaminophen (PERCOCET/ROXICET) 5-325 MG per tablet 1 tablet (1 tablet Oral Given 09/30/21 2051)  ondansetron (ZOFRAN-ODT) disintegrating tablet 4 mg (4 mg Oral Given 09/30/21 2050)    ED Course/ Medical Decision Making/ A&P    Patient seen and examined. History obtained directly from patient.  I reviewed her external surgery notes as well as previous recent ED visits.     Labs/EKG: None ordered  Imaging: None ordered  Medications/Fluids: Ordered: Oral oxycodone/acetaminophen, ODT Zofran.  Patient was prescribed Percocet in July which she states that she tolerated, despite nausea and vomiting being  listed as side effect and epic.  Most recent vital signs reviewed and are as follows: BP 111/67 (BP Location: Right Arm)   Pulse 79   Temp 98 F (36.7 C) (Oral)   Resp 16   Ht '5\' 4"'$  (1.626 m)   Wt 81.2 kg   LMP 03/09/2016   SpO2 100%   BMI 30.73 kg/m   Initial impression: Rectal pain, external hemorrhoids  Home treatment plan: Continue therapy and outpatient follow-up.  Return instructions discussed with patient: Uncontrolled pain  Follow-up instructions discussed with patient: Follow-up with her care team in the next 1 week.                          Medical Decision Making Risk Prescription drug management.   Patient with ongoing rectal pain.  I suspect some mild skin breakdown due to recent liquid stools.  Cannot rule out rectal fissure, I do not see a large fissure on exam currently.  She does have external hemorrhoids, likely contributing to her symptoms.  No signs of perirectal abscess or infection at this time.        Final Clinical Impression(s) / ED Diagnoses Final diagnoses:  Rectal pain  External hemorrhoids    Rx / DC Orders ED Discharge Orders          Ordered     oxyCODONE-acetaminophen (PERCOCET/ROXICET) 5-325 MG tablet  Every 6 hours PRN        09/30/21 2109              Carlisle Cater, PA-C 09/30/21 2130    Dorie Rank, MD 10/01/21 [phone]

## 2021-09-30 NOTE — Discharge Instructions (Signed)
Please read and follow all provided instructions.  Your diagnoses today include:  1. Rectal pain   2. External hemorrhoids     Tests performed today include: Vital signs. See below for your results today.   Medications prescribed:  Percocet (oxycodone/acetaminophen) - narcotic pain medication  DO NOT drive or perform any activities that require you to be awake and alert because this medicine can make you drowsy. BE VERY CAREFUL not to take multiple medicines containing Tylenol (also called acetaminophen). Doing so can lead to an overdose which can damage your liver and cause liver failure and possibly death.  Take any prescribed medications only as directed.  Home care instructions:  Follow any educational materials contained in this packet.  You are on maximal therapy for your rectal fissure/hemorrhoids.  Please continue this as prescribed.  Please follow-up with your care team for continued evaluation and therapy.  Use the stronger pain medication only as needed for severe pain.  Medication can cause constipation so make sure that you are monitoring your stools and taking a stool softener if needed.  Follow-up instructions: Please follow-up with your care team for further evaluation of your symptoms.   Return instructions:  Please return to the Emergency Department if you experience worsening symptoms.  Please return if you have any other emergent concerns.  Additional Information:  Your vital signs today were: BP 111/67 (BP Location: Right Arm)   Pulse 79   Temp 98 F (36.7 C) (Oral)   Resp 16   Ht '5\' 4"'$  (1.626 m)   Wt 81.2 kg   LMP 03/09/2016   SpO2 100%   BMI 30.73 kg/m  If your blood pressure (BP) was elevated above 135/85 this visit, please have this repeated by your doctor within one month. --------------

## 2021-09-30 NOTE — ED Notes (Signed)
Discharge paperwork given and verbally understood. 

## 2021-09-30 NOTE — ED Triage Notes (Signed)
Pt presents to the ED with hemorrhoid pain since 08/27/21. Has been seen multiple times for this and has been prescribed multiple medications with no relief.

## 2022-06-15 ENCOUNTER — Encounter (HOSPITAL_BASED_OUTPATIENT_CLINIC_OR_DEPARTMENT_OTHER): Payer: Self-pay

## 2022-06-15 ENCOUNTER — Other Ambulatory Visit: Payer: Self-pay

## 2022-06-15 ENCOUNTER — Emergency Department (HOSPITAL_BASED_OUTPATIENT_CLINIC_OR_DEPARTMENT_OTHER)
Admission: EM | Admit: 2022-06-15 | Discharge: 2022-06-15 | Disposition: A | Discharge status: Home / Self Care | Payer: Medicare HMO | Attending: Emergency Medicine | Admitting: Emergency Medicine

## 2022-06-15 DIAGNOSIS — R109 Unspecified abdominal pain: Secondary | ICD-10-CM | POA: Diagnosis not present

## 2022-06-15 DIAGNOSIS — Z85038 Personal history of other malignant neoplasm of large intestine: Secondary | ICD-10-CM | POA: Insufficient documentation

## 2022-06-15 DIAGNOSIS — I10 Essential (primary) hypertension: Secondary | ICD-10-CM | POA: Diagnosis not present

## 2022-06-15 DIAGNOSIS — R1030 Lower abdominal pain, unspecified: Secondary | ICD-10-CM | POA: Diagnosis present

## 2022-06-15 LAB — COMPREHENSIVE METABOLIC PANEL
ALT: 7 U/L (ref 0–44)
AST: 19 U/L (ref 15–41)
Albumin: 4 g/dL (ref 3.5–5.0)
Alkaline Phosphatase: 57 U/L (ref 38–126)
Anion gap: 10 (ref 5–15)
BUN: 17 mg/dL (ref 6–20)
CO2: 22 mmol/L (ref 22–32)
Calcium: 9.1 mg/dL (ref 8.9–10.3)
Chloride: 106 mmol/L (ref 98–111)
Creatinine, Ser: 1.06 mg/dL — ABNORMAL HIGH (ref 0.44–1.00)
GFR, Estimated: >60 mL/min (ref >60)
Glucose, Bld: 99 mg/dL (ref 70–99)
Potassium: 4.7 mmol/L (ref 3.5–5.1)
Sodium: 138 mmol/L (ref 135–145)
Total Bilirubin: 0.4 mg/dL (ref 0.3–1.2)
Total Protein: 7.8 g/dL (ref 6.5–8.1)

## 2022-06-15 LAB — URINALYSIS, ROUTINE W REFLEX MICROSCOPIC
Bacteria, UA: NONE SEEN
Bilirubin Urine: NEGATIVE
Glucose, UA: NEGATIVE mg/dL
Hgb urine dipstick: NEGATIVE
Ketones, ur: NEGATIVE mg/dL
Nitrite: NEGATIVE
Protein, ur: NEGATIVE mg/dL
Specific Gravity, Urine: 1.019 (ref 1.005–1.030)
pH: 6 (ref 5.0–8.0)

## 2022-06-15 LAB — LIPASE, BLOOD: Lipase: 35 U/L (ref 11–51)

## 2022-06-15 MED ORDER — KETOROLAC TROMETHAMINE 30 MG/ML IJ SOLN
30.0000 mg | Freq: Once | INTRAMUSCULAR | Status: AC
  Administered 2022-06-15: 30 mg via INTRAMUSCULAR
  Filled 2022-06-15: qty 1

## 2022-06-15 NOTE — ED Triage Notes (Signed)
Pt reports frontal headache from this morning. Lower abdominal cramping that radiates around to her lower back since Friday. Denies n/v/d, or urinary symptoms.

## 2022-06-15 NOTE — ED Notes (Signed)
RN provided AVS using Teachback Method. Patient verbalizes understanding of Discharge Instructions. Opportunity for Questioning and Answers were provided by RN. Patient Discharged from ED ambulatory to Home with Family. ? ?

## 2022-06-15 NOTE — ED Provider Notes (Signed)
[facility] EMERGENCY DEPARTMENT AT [facility] Provider Note   CSN: [id number] Arrival date & time: 06/15/22  1925     History  Chief Complaint  Patient presents with   Abdominal Pain    Sarah Mathews is a 57 y.o. female.   Abdominal Pain    Patient has history of hypertension reflux obesity fibroids.  Patient also has a history of colorectal cancer with hemicolectomy back in June 2023.[name]  Patient states she started having some abdominal cramping today in her lower abdomen rating around to her back.  She is not having any vomiting or diarrhea.  No nausea.  No trouble with her appetite.  No dysuria.  Patient states she is also had a mild headache.  Home Medications Prior to Admission medications   Medication Sig Start Date End Date Taking? Authorizing Provider  acetaminophen (TYLENOL) 500 MG tablet Take 1,000 mg by mouth every 4 (four) hours as needed. 02/12/16  Yes [provider]  albuterol (PROVENTIL HFA;VENTOLIN HFA) 108 (90 Base) MCG/ACT inhaler Inhale 2 puffs into the lungs every 6 (six) hours as needed for wheezing or shortness of breath. 04/28/18  Yes Amyot, Ali Lowe, NP  budesonide-formoterol (SYMBICORT) 80-4.5 MCG/ACT inhaler Inhale 2 puffs into the lungs daily. 05/09/17  Yes Wallis Bamberg, PA-C  fluticasone (FLONASE) 50 MCG/ACT nasal spray Place 1 spray into both nostrils daily. 01/08/21  Yes Tomi Bamberger, PA-C  labetalol (NORMODYNE) 100 MG tablet Take 100 mg by mouth 2 (two) times daily. 03/06/22  Yes [provider]  cetirizine (ZYRTEC ALLERGY) 10 MG tablet Take 1 tablet (10 mg total) by mouth daily. 02/29/20   Hall-Potvin, [place], PA-C  dicyclomine (BENTYL) 10 MG capsule Take 10 mg by mouth 4 (four) times daily as needed for cramping. 03/27/21   [provider]  dicyclomine (BENTYL) 20 MG tablet Take 1 tablet (20 mg total) by mouth 4 (four) times daily -  before meals and at bedtime. As needed for cramping 07/30/20   Wieters, [employer] C, PA-C   hydrocortisone (ANUSOL-HC) 25 MG suppository Place 1 suppository (25 mg total) rectally 2 (two) times daily as needed for hemorrhoids or anal itching. 09/16/21   Long, Arlyss Repress, MD      Allergies    Rocephin [ceftriaxone], Buspirone, Butorphanol, Oxycodone-acetaminophen, Propoxyphene, Alprazolam, Clonazepam, Darvocet [propoxyphene n-acetaminophen], Darvon [propoxyphene hcl], Diazepam, Percocet [oxycodone-acetaminophen], Toradol [ketorolac tromethamine], and Doxycycline    Review of Systems   Review of Systems  Gastrointestinal:  Positive for abdominal pain.    Physical Exam Updated Vital Signs BP 137/63 (BP Location: Right Arm) Comment: Simultaneous filing. User may not have seen previous data. Comment (BP Location): Simultaneous filing. User may not have seen previous data.  Pulse 61 Comment: Simultaneous filing. User may not have seen previous data.  Temp 98.6 F (37 C) (Oral)   Resp 18 Comment: Simultaneous filing. User may not have seen previous data.  Ht 1.626 m (5\' 4" )   Wt 106.6 kg   LMP 03/09/2016   SpO2 97% Comment: Simultaneous filing. User may not have seen previous data.  BMI 40.34 kg/m  Physical Exam Vitals and nursing note reviewed.  Constitutional:      General: She is not in acute distress.    Appearance: She is well-developed.  HENT:     Head: Normocephalic and atraumatic.     Right Ear: External ear normal.     Left Ear: External ear normal.  Eyes:     General: No scleral icterus.  Right eye: No discharge.        Left eye: No discharge.     Conjunctiva/sclera: Conjunctivae normal.  Neck:     Trachea: No tracheal deviation.  Cardiovascular:     Rate and Rhythm: Normal rate and regular rhythm.  Pulmonary:     Effort: Pulmonary effort is normal. No respiratory distress.     Breath sounds: Normal breath sounds. No stridor. No wheezing or rales.  Abdominal:     General: Bowel sounds are normal. There is no distension.     Palpations: Abdomen is soft.      Tenderness: There is no abdominal tenderness. There is no guarding or rebound.  Musculoskeletal:        General: No tenderness or deformity.     Cervical back: Neck supple.  Skin:    General: Skin is warm and dry.     Findings: No rash.  Neurological:     General: No focal deficit present.     Mental Status: She is alert.     Cranial Nerves: No cranial nerve deficit, dysarthria or facial asymmetry.     Sensory: No sensory deficit.     Motor: No abnormal muscle tone or seizure activity.     Coordination: Coordination normal.  Psychiatric:        Mood and Affect: Mood normal.     ED Results / Procedures / Treatments   Labs (all labs ordered are listed, but only abnormal results are displayed) Labs Reviewed  COMPREHENSIVE METABOLIC PANEL - Abnormal; Notable for the following components:      Result Value   Creatinine, Ser 1.06 (*)    All other components within normal limits  URINALYSIS, ROUTINE W REFLEX MICROSCOPIC - Abnormal; Notable for the following components:   APPearance HAZY (*)    Leukocytes,Ua LARGE (*)    All other components within normal limits  LIPASE, BLOOD  CBC WITH DIFFERENTIAL/PLATELET  CBC WITH DIFFERENTIAL/PLATELET    EKG None  Radiology No results found.  Procedures Procedures    Medications Ordered in ED Medications  ketorolac (TORADOL) 30 MG/ML injection 30 mg (has no administration in time range)    ED Course/ Medical Decision Making/ A&P Clinical Course as of 06/15/22 2147  Mon Jun 15, 2022  2101 Unable to obtain blood.  Pt refuses further attempts [JK]  2127 Urinalysis does show 21-50 white blood cells large leukocyte esterase.  Significant amount of squamous cells [JK]  2146 Patient requested a dose of Toradol.  Her records indicate hypertension related to Toradol.  Will not prescribe it for her breath I do not think a one-time dose will be an issue [JK]    Clinical Course User Index [JK] Linwood Dibbles, MD                              Medical Decision Making Amount and/or Complexity of Data Reviewed Labs: ordered.  Risk Prescription drug management.   Patient presented to the ED for evaluation of abdominal pain.  Patient describes some tenderness in her lower abdomen.  No urinary symptoms.  No trouble with appetite.  Concerned about the possibility of urinary tract infection diverticulitis colitis.  No signs of any metabolic abnormalities.  Lipase is normal.  CBC was ordered but they were not able to obtain it.  Patient refused any further testing.  Also considered CT scanning but the patient refused IV access.  Her abdominal exam is benign.  I  do not feel that CT scan imaging is necessary at this point.  Recommend patient return to the ER if she starts having fevers chills vomiting or other concerning symptoms.        Final Clinical Impression(s) / ED Diagnoses Final diagnoses:  Abdominal pain, unspecified abdominal location    Rx / DC Orders ED Discharge Orders     None         Linwood Dibbles, MD 06/15/22 2149

## 2022-06-15 NOTE — ED Notes (Signed)
Blood Specimens hemolyzed in the Laboratory. Patient made aware but refuses second Blood Collection. [place] Service offered for Collection but Patient refuses. EDP made aware.

## 2022-06-15 NOTE — Discharge Instructions (Signed)
Return to the emergency room if you start having fevers chills or worsening symptoms to do further testing as we discussed.  Follow-up with a primary care doctor to be rechecked later this week

## 2022-09-04 ENCOUNTER — Ambulatory Visit
Admission: EM | Admit: 2022-09-04 | Discharge: 2022-09-04 | Disposition: A | Discharge status: Home / Self Care | Payer: Medicare HMO | Attending: Internal Medicine | Admitting: Internal Medicine

## 2022-09-04 DIAGNOSIS — J018 Other acute sinusitis: Secondary | ICD-10-CM | POA: Diagnosis not present

## 2022-09-04 MED ORDER — AMOXICILLIN 875 MG PO TABS: 875.0000 mg | ORAL_TABLET | Freq: Two times a day (BID) | ORAL | 0 refills | Status: AC

## 2022-09-04 NOTE — Discharge Instructions (Addendum)
I have prescribed an antibiotic for sinus infection.  Please follow-up if any symptoms persist or worsen.

## 2022-09-04 NOTE — ED Provider Notes (Signed)
[facility]    CSN: [id number] Arrival date & time: 09/04/22  1417      History   Chief Complaint Chief Complaint  Patient presents with   Nasal Congestion    HPI Sarah Mathews is a 57 y.o. female.   Patient presents with approximately 1 month history of nasal congestion.  She saw her PCP on 7/18 who prescribed her Medrol Dosepak.  Reports that she did have a cough that improved with steroid therapy.  Nasal congestion has not improved.  Reports headache in the frontal portion of her head.  States that her mucus has been yellow.  Denies any associated fever.  Has also taken Mucinex for symptoms with minimal improvement.  Reports history of asthma but has not had to use albuterol inhaler since being sick.  Patient denies any chest pain or shortness of breath.     Past Medical History:  Diagnosis Date   Depressed    Fibroids    GERD (gastroesophageal reflux disease)    Hypertension    Obesity     Patient Active Problem List   Diagnosis Date Noted   Asthmatic bronchitis 09/03/2016   SUI (stress urinary incontinence, female) 02/06/2016   Dysmenorrhea 12/25/2015   Abnormal uterine bleeding (AUB) 12/25/2015   Perimenopausal 12/25/2015   Metatarsal deformity 11/09/2014   Pain, foot 02/07/2013   Equinus deformity of foot, acquired 02/07/2013   Tenosynovitis of foot and ankle 02/07/2013    Past Surgical History:  Procedure Laterality Date   COLON SURGERY     DILATION AND CURETTAGE OF UTERUS  09/10/2006   TUBAL LIGATION      OB History     Gravida  17   Para  7   Term  6   Preterm  1   AB  3   Living  7      SAB  2   IAB      Ectopic  1   Multiple  0   Live Births  7            Home Medications    Prior to Admission medications   Medication Sig Start Date End Date Taking? Authorizing Provider  amoxicillin (AMOXIL) 875 MG tablet Take 1 tablet (875 mg total) by mouth 2 (two) times daily for 7 days. 09/04/22 09/11/22 Yes Shiane Wenberg,  Acie Fredrickson, FNP  acetaminophen (TYLENOL) 500 MG tablet Take 1,000 mg by mouth every 4 (four) hours as needed. 02/12/16   [provider]  albuterol (PROVENTIL HFA;VENTOLIN HFA) 108 (90 Base) MCG/ACT inhaler Inhale 2 puffs into the lungs every 6 (six) hours as needed for wheezing or shortness of breath. 04/28/18   Sudie Grumbling, NP  budesonide-formoterol (SYMBICORT) 80-4.5 MCG/ACT inhaler Inhale 2 puffs into the lungs daily. 05/09/17   Wallis Bamberg, PA-C  cetirizine (ZYRTEC ALLERGY) 10 MG tablet Take 1 tablet (10 mg total) by mouth daily. 02/29/20   Hall-Potvin, [place], PA-C  dicyclomine (BENTYL) 10 MG capsule Take 10 mg by mouth 4 (four) times daily as needed for cramping. 03/27/21   [provider]  dicyclomine (BENTYL) 20 MG tablet Take 1 tablet (20 mg total) by mouth 4 (four) times daily -  before meals and at bedtime. As needed for cramping 07/30/20   Wieters, Hallie C, PA-C  fluticasone (FLONASE) 50 MCG/ACT nasal spray Place 1 spray into both nostrils daily. 01/08/21   Tomi Bamberger, PA-C  hydrocortisone (ANUSOL-HC) 25 MG suppository Place 1 suppository (25 mg total) rectally 2 (  two) times daily as needed for hemorrhoids or anal itching. 09/16/21   Long, Arlyss Repress, MD  labetalol (NORMODYNE) 100 MG tablet Take 100 mg by mouth 2 (two) times daily. 03/06/22   [provider]    Family History Family History  Problem Relation Age of Onset   Heart failure Mother     Social History Social History   Tobacco Use   Smoking status: Former    Types: Cigarettes   Smokeless tobacco: Never  Vaping Use   Vaping status: Never Used  Substance Use Topics   Alcohol use: Yes   Drug use: No     Allergies   Rocephin [ceftriaxone], Buspirone, Butorphanol, Oxycodone-acetaminophen, Propoxyphene, Alprazolam, Clonazepam, Darvocet [propoxyphene n-acetaminophen], Darvon [propoxyphene hcl], Diazepam, Percocet [oxycodone-acetaminophen], Toradol [ketorolac tromethamine], and  Doxycycline   Review of Systems Review of Systems Per HPI  Physical Exam Triage Vital Signs ED Triage Vitals  Encounter Vitals Group     BP 09/04/22 1436 (!) 143/82     Systolic BP Percentile --      Diastolic BP Percentile --      Pulse Rate 09/04/22 1436 86     Resp 09/04/22 1436 16     Temp 09/04/22 1436 98.5 F (36.9 C)     Temp Source 09/04/22 1436 Oral     SpO2 09/04/22 1436 94 %     Weight --      Height --      Head Circumference --      Peak Flow --      Pain Score 09/04/22 1437 9     Pain Loc --      Pain Education --      Exclude from Growth Chart --    No data found.  Updated Vital Signs BP (!) 143/82 (BP Location: Left Arm)   Pulse 86   Temp 98.5 F (36.9 C) (Oral)   Resp 16   LMP 03/09/2016   SpO2 98%   Visual Acuity Right Eye Distance:   Left Eye Distance:   Bilateral Distance:    Right Eye Near:   Left Eye Near:    Bilateral Near:     Physical Exam Constitutional:      General: She is not in acute distress.    Appearance: Normal appearance. She is not toxic-appearing or diaphoretic.  HENT:     Head: Normocephalic and atraumatic.     Right Ear: Tympanic membrane and ear canal normal.     Left Ear: Tympanic membrane and ear canal normal.     Nose: Congestion present.     Mouth/Throat:     Mouth: Mucous membranes are moist.     Pharynx: No posterior oropharyngeal erythema.  Eyes:     Extraocular Movements: Extraocular movements intact.     Conjunctiva/sclera: Conjunctivae normal.     Pupils: Pupils are equal, round, and reactive to light.  Cardiovascular:     Rate and Rhythm: Normal rate and regular rhythm.     Pulses: Normal pulses.     Heart sounds: Normal heart sounds.  Pulmonary:     Effort: Pulmonary effort is normal. No respiratory distress.     Breath sounds: Normal breath sounds. No stridor. No wheezing, rhonchi or rales.  Abdominal:     General: Abdomen is flat. Bowel sounds are normal.     Palpations: Abdomen is soft.   Musculoskeletal:        General: Normal range of motion.     Cervical back: Normal range  of motion.  Skin:    General: Skin is warm and dry.  Neurological:     General: No focal deficit present.     Mental Status: She is alert and oriented to person, place, and time. Mental status is at baseline.  Psychiatric:        Mood and Affect: Mood normal.        Behavior: Behavior normal.      UC Treatments / Results  Labs (all labs ordered are listed, but only abnormal results are displayed) Labs Reviewed - No data to display  EKG   Radiology No results found.  Procedures Procedures (including critical care time)  Medications Ordered in UC Medications - No data to display  Initial Impression / Assessment and Plan / UC Course  I have reviewed the triage vital signs and the nursing notes.  Pertinent labs & imaging results that were available during my care of the patient were reviewed by me and considered in my medical decision making (see chart for details).     Given duration of symptoms, I am concerned for secondary bacterial infection such as sinus infection.  Given steroid therapy was not beneficial, will treat with antibiotic.  Patient reports that she is able to take amoxicillin without reaction so prescribed this for 7 days.  Advised supportive care and symptom management.  There are no adventitious lung sounds on exam so do not think that chest imaging is necessary.  Advised strict return precautions.  Patient verbalized understanding and was agreeable with plan. Final Clinical Impressions(s) / UC Diagnoses   Final diagnoses:  Acute non-recurrent sinusitis of other sinus     Discharge Instructions      I have prescribed an antibiotic for sinus infection.  Please follow-up if any symptoms persist or worsen.    ED Prescriptions     Medication Sig Dispense Auth. Provider   amoxicillin (AMOXIL) 875 MG tablet Take 1 tablet (875 mg total) by mouth 2 (two) times  daily for 7 days. 14 tablet [place], Acie Fredrickson, [place]      PDMP not reviewed this encounter.   Gustavus Bryant, [place] 09/04/22 [phone]

## 2022-09-04 NOTE — ED Triage Notes (Signed)
Pt states she has had congestion for the past month states she saw her primary care doctor and was put on prednisone.  States it did not help she is still having  headache,generalized weakness and chest congestion,
